# Patient Record
Sex: Male | Born: 1945 | Race: White | Hispanic: No | Smoking: Former smoker
Health system: Southern US, Community
[De-identification: ages and names within clinical notes are randomized; demographics above are authoritative.]

## PROBLEM LIST (undated history)

## (undated) DIAGNOSIS — C801 Malignant (primary) neoplasm, unspecified: Secondary | ICD-10-CM

## (undated) DIAGNOSIS — E785 Hyperlipidemia, unspecified: Secondary | ICD-10-CM

## (undated) DIAGNOSIS — R011 Cardiac murmur, unspecified: Secondary | ICD-10-CM

## (undated) DIAGNOSIS — R0989 Other specified symptoms and signs involving the circulatory and respiratory systems: Secondary | ICD-10-CM

## (undated) DIAGNOSIS — Z87891 Personal history of nicotine dependence: Secondary | ICD-10-CM

## (undated) DIAGNOSIS — T451X5A Adverse effect of antineoplastic and immunosuppressive drugs, initial encounter: Secondary | ICD-10-CM

## (undated) DIAGNOSIS — M810 Age-related osteoporosis without current pathological fracture: Secondary | ICD-10-CM

## (undated) DIAGNOSIS — R7301 Impaired fasting glucose: Secondary | ICD-10-CM

## (undated) DIAGNOSIS — R112 Nausea with vomiting, unspecified: Secondary | ICD-10-CM

## (undated) DIAGNOSIS — I370 Nonrheumatic pulmonary valve stenosis: Secondary | ICD-10-CM

## (undated) DIAGNOSIS — I10 Essential (primary) hypertension: Secondary | ICD-10-CM

## (undated) DIAGNOSIS — T7840XA Allergy, unspecified, initial encounter: Secondary | ICD-10-CM

## (undated) DIAGNOSIS — J439 Emphysema, unspecified: Secondary | ICD-10-CM

## (undated) HISTORY — DX: Malignant (primary) neoplasm, unspecified: C80.1

## (undated) HISTORY — DX: Impaired fasting glucose: R73.01

## (undated) HISTORY — DX: Age-related osteoporosis without current pathological fracture: M81.0

## (undated) HISTORY — DX: Cardiac murmur, unspecified: R01.1

## (undated) HISTORY — DX: Nonrheumatic pulmonary valve stenosis: I37.0

## (undated) HISTORY — DX: Nausea with vomiting, unspecified: T45.1X5A

## (undated) HISTORY — DX: Personal history of nicotine dependence: Z87.891

## (undated) HISTORY — DX: Allergy, unspecified, initial encounter: T78.40XA

## (undated) HISTORY — DX: Nausea with vomiting, unspecified: R11.2

## (undated) HISTORY — DX: Emphysema, unspecified: J43.9

## (undated) HISTORY — DX: Other specified symptoms and signs involving the circulatory and respiratory systems: R09.89

## (undated) HISTORY — DX: Hyperlipidemia, unspecified: E78.5

---

## 2006-07-22 ENCOUNTER — Ambulatory Visit: Payer: Self-pay | Admitting: Ophthalmology

## 2008-03-11 HISTORY — PX: EYE SURGERY: SHX253

## 2009-04-12 ENCOUNTER — Ambulatory Visit: Payer: Self-pay | Admitting: Ophthalmology

## 2010-09-03 ENCOUNTER — Ambulatory Visit: Payer: Self-pay | Admitting: Family Medicine

## 2014-01-21 LAB — PSA

## 2014-04-04 DIAGNOSIS — J0140 Acute pansinusitis, unspecified: Secondary | ICD-10-CM | POA: Diagnosis not present

## 2014-04-26 DIAGNOSIS — E785 Hyperlipidemia, unspecified: Secondary | ICD-10-CM | POA: Diagnosis not present

## 2014-04-26 DIAGNOSIS — E781 Pure hyperglyceridemia: Secondary | ICD-10-CM | POA: Diagnosis not present

## 2014-04-26 DIAGNOSIS — J329 Chronic sinusitis, unspecified: Secondary | ICD-10-CM | POA: Diagnosis not present

## 2014-07-26 ENCOUNTER — Ambulatory Visit
Admission: RE | Admit: 2014-07-26 | Discharge: 2014-07-26 | Disposition: A | Discharge status: Home / Self Care | Payer: Medicare Other | Source: Ambulatory Visit | Attending: Unknown Physician Specialty | Admitting: Unknown Physician Specialty

## 2014-07-26 ENCOUNTER — Other Ambulatory Visit: Payer: Self-pay | Admitting: Unknown Physician Specialty

## 2014-07-26 DIAGNOSIS — R2241 Localized swelling, mass and lump, right lower limb: Secondary | ICD-10-CM

## 2014-07-26 DIAGNOSIS — E785 Hyperlipidemia, unspecified: Secondary | ICD-10-CM | POA: Diagnosis not present

## 2014-07-26 DIAGNOSIS — M1711 Unilateral primary osteoarthritis, right knee: Secondary | ICD-10-CM | POA: Diagnosis not present

## 2014-07-26 DIAGNOSIS — M25861 Other specified joint disorders, right knee: Secondary | ICD-10-CM | POA: Diagnosis not present

## 2014-07-26 DIAGNOSIS — E781 Pure hyperglyceridemia: Secondary | ICD-10-CM | POA: Diagnosis not present

## 2014-07-27 ENCOUNTER — Other Ambulatory Visit: Payer: Self-pay | Admitting: Unknown Physician Specialty

## 2014-07-27 DIAGNOSIS — R2241 Localized swelling, mass and lump, right lower limb: Secondary | ICD-10-CM

## 2014-08-03 ENCOUNTER — Ambulatory Visit: Payer: Medicare Other

## 2014-08-10 ENCOUNTER — Telehealth: Payer: Self-pay | Admitting: Unknown Physician Specialty

## 2014-08-10 ENCOUNTER — Ambulatory Visit
Admission: RE | Admit: 2014-08-10 | Discharge: 2014-08-10 | Disposition: A | Discharge status: Home / Self Care | Payer: Medicare Other | Source: Ambulatory Visit | Attending: Unknown Physician Specialty | Admitting: Unknown Physician Specialty

## 2014-08-10 DIAGNOSIS — M23000 Cystic meniscus, unspecified lateral meniscus, right knee: Secondary | ICD-10-CM | POA: Diagnosis not present

## 2014-08-10 DIAGNOSIS — R2241 Localized swelling, mass and lump, right lower limb: Secondary | ICD-10-CM

## 2014-08-10 DIAGNOSIS — M1711 Unilateral primary osteoarthritis, right knee: Secondary | ICD-10-CM | POA: Diagnosis not present

## 2014-08-10 DIAGNOSIS — S83206A Unspecified tear of unspecified meniscus, current injury, right knee, initial encounter: Secondary | ICD-10-CM

## 2014-08-10 DIAGNOSIS — M25561 Pain in right knee: Secondary | ICD-10-CM | POA: Diagnosis not present

## 2014-08-10 MED ORDER — GADOBENATE DIMEGLUMINE 529 MG/ML IV SOLN
18.0000 mL | Freq: Once | INTRAVENOUS | Status: AC | PRN
  Administered 2014-08-10: 18 mL via INTRAVENOUS

## 2014-08-10 NOTE — Telephone Encounter (Signed)
Pt with meniscal tear with associated cyst accounting for palpable abnormality.  Will refer to Orthopedics.  Left message on answering machine

## 2014-09-30 DIAGNOSIS — S83261A Peripheral tear of lateral meniscus, current injury, right knee, initial encounter: Secondary | ICD-10-CM | POA: Diagnosis not present

## 2014-09-30 DIAGNOSIS — M23000 Cystic meniscus, unspecified lateral meniscus, right knee: Secondary | ICD-10-CM | POA: Diagnosis not present

## 2014-09-30 DIAGNOSIS — M1711 Unilateral primary osteoarthritis, right knee: Secondary | ICD-10-CM | POA: Diagnosis not present

## 2015-01-12 ENCOUNTER — Encounter: Payer: Self-pay | Admitting: Unknown Physician Specialty

## 2015-01-20 ENCOUNTER — Encounter: Payer: Self-pay | Admitting: Unknown Physician Specialty

## 2015-01-20 ENCOUNTER — Ambulatory Visit (INDEPENDENT_AMBULATORY_CARE_PROVIDER_SITE_OTHER): Payer: Medicare Other | Admitting: Unknown Physician Specialty

## 2015-01-20 VITALS — BP 125/74 | HR 66 | Temp 98.5°F | Ht 73.3 in | Wt 200.6 lb

## 2015-01-20 DIAGNOSIS — Z23 Encounter for immunization: Secondary | ICD-10-CM

## 2015-01-20 DIAGNOSIS — E785 Hyperlipidemia, unspecified: Secondary | ICD-10-CM | POA: Insufficient documentation

## 2015-01-20 DIAGNOSIS — J0101 Acute recurrent maxillary sinusitis: Secondary | ICD-10-CM

## 2015-01-20 DIAGNOSIS — E781 Pure hyperglyceridemia: Secondary | ICD-10-CM | POA: Insufficient documentation

## 2015-01-20 DIAGNOSIS — R7301 Impaired fasting glucose: Secondary | ICD-10-CM

## 2015-01-20 DIAGNOSIS — J309 Allergic rhinitis, unspecified: Secondary | ICD-10-CM | POA: Insufficient documentation

## 2015-01-20 DIAGNOSIS — M1990 Unspecified osteoarthritis, unspecified site: Secondary | ICD-10-CM

## 2015-01-20 MED ORDER — AMOXICILLIN-POT CLAVULANATE 875-125 MG PO TABS: 1.0000 | ORAL_TABLET | Freq: Two times a day (BID) | ORAL | Status: DC

## 2015-01-20 NOTE — Addendum Note (Signed)
Addended by: Moss Mc on: 01/20/2015 04:25 PM   Modules accepted: Orders, SmartSet

## 2015-01-20 NOTE — Progress Notes (Signed)
   BP 125/74 mmHg  Pulse 66  Temp(Src) 98.5 F (36.9 C)  Ht 6' 1.3" (1.862 m)  Wt 200 lb 9.6 oz (90.992 kg)  BMI 26.24 kg/m2  SpO2 97%   Subjective:    Patient ID: Marcus Cortez, male    DOB: 02-12-1946, 69 y.o.   MRN: [id number]  HPI: Marcus Cortez is a 69 y.o. male  Chief Complaint  Patient presents with  . URI    pt states he has had some nasal congestion, drainage, cough, and feels like ears have water in them. Wants to know if it is okay to get a flu shot.   Sinusitis This is a recurrent problem. The current episode started 1 to 4 weeks ago. The problem has been gradually worsening since onset. There has been no fever. Associated symptoms include congestion, headaches, a hoarse voice, sinus pressure and a sore throat. Pertinent negatives include no chills, coughing, diaphoresis or ear pain.    Relevant past medical, surgical, family and social history reviewed and updated as indicated. Interim medical history since our last visit reviewed. Allergies and medications reviewed and updated.  Review of Systems  Constitutional: Negative for chills and diaphoresis.  HENT: Positive for congestion, hoarse voice, sinus pressure and sore throat. Negative for ear pain.   Respiratory: Negative for cough.   Neurological: Positive for headaches.    Per HPI unless specifically indicated above     Objective:    BP 125/74 mmHg  Pulse 66  Temp(Src) 98.5 F (36.9 C)  Ht 6' 1.3" (1.862 m)  Wt 200 lb 9.6 oz (90.992 kg)  BMI 26.24 kg/m2  SpO2 97%  Wt Readings from Last 3 Encounters:  01/20/15 200 lb 9.6 oz (90.992 kg)  07/26/14 199 lb (90.266 kg)    Physical Exam  Constitutional: He is oriented to person, place, and time. He appears well-developed and well-nourished. No distress.  HENT:  Head: Normocephalic and atraumatic.  Right Ear: Tympanic membrane and ear canal normal.  Left Ear: Tympanic membrane and ear canal normal.  Nose: Mucosal edema and sinus tenderness present.  Right sinus exhibits maxillary sinus tenderness. Left sinus exhibits maxillary sinus tenderness.  Mouth/Throat: Mucous membranes are normal. Oropharyngeal exudate and posterior oropharyngeal edema present.  Eyes: Conjunctivae and lids are normal. Right eye exhibits no discharge. Left eye exhibits no discharge. No scleral icterus.  Cardiovascular: Normal rate and regular rhythm.   Pulmonary/Chest: Effort normal. No respiratory distress.  Abdominal: Normal appearance and bowel sounds are normal. He exhibits no distension. There is no splenomegaly or hepatomegaly. There is no tenderness.  Musculoskeletal: Normal range of motion.  Neurological: He is alert and oriented to person, place, and time.  Skin: Skin is intact. No rash noted. No pallor.  Psychiatric: He has a normal mood and affect. His behavior is normal. Judgment and thought content normal.    Results for orders placed or performed in visit on 01/20/15  PSA  Result Value Ref Range   PSA from PP       Assessment & Plan:   Problem List Items Addressed This Visit    None    Visit Diagnoses    Acute recurrent maxillary sinusitis    -  Primary    Relevant Medications    amoxicillin-clavulanate (AUGMENTIN) 875-125 MG tablet        Follow up plan: Return if symptoms worsen or fail to improve.

## 2015-01-26 DIAGNOSIS — Z961 Presence of intraocular lens: Secondary | ICD-10-CM | POA: Diagnosis not present

## 2015-02-07 ENCOUNTER — Encounter: Payer: Self-pay | Admitting: Unknown Physician Specialty

## 2015-04-25 ENCOUNTER — Ambulatory Visit (INDEPENDENT_AMBULATORY_CARE_PROVIDER_SITE_OTHER): Payer: Medicare Other | Admitting: Family Medicine

## 2015-04-25 ENCOUNTER — Encounter: Payer: Self-pay | Admitting: Family Medicine

## 2015-04-25 VITALS — BP 122/72 | HR 58 | Temp 97.7°F | Ht 72.0 in | Wt 201.0 lb

## 2015-04-25 DIAGNOSIS — R7301 Impaired fasting glucose: Secondary | ICD-10-CM | POA: Diagnosis not present

## 2015-04-25 DIAGNOSIS — J329 Chronic sinusitis, unspecified: Secondary | ICD-10-CM | POA: Diagnosis not present

## 2015-04-25 DIAGNOSIS — H6091 Unspecified otitis externa, right ear: Secondary | ICD-10-CM

## 2015-04-25 DIAGNOSIS — E785 Hyperlipidemia, unspecified: Secondary | ICD-10-CM

## 2015-04-25 DIAGNOSIS — R351 Nocturia: Secondary | ICD-10-CM | POA: Diagnosis not present

## 2015-04-25 DIAGNOSIS — Z0000 Encounter for general adult medical examination without abnormal findings: Secondary | ICD-10-CM

## 2015-04-25 LAB — BAYER DCA HB A1C WAIVED: HB A1C (BAYER DCA - WAIVED): 5.9 % (ref <7.0)

## 2015-04-25 MED ORDER — DOXYCYCLINE HYCLATE 100 MG PO TABS: 100.0000 mg | ORAL_TABLET | Freq: Two times a day (BID) | ORAL | Status: DC

## 2015-04-25 MED ORDER — NEOMYCIN-POLYMYXIN-HC 3.5-10000-1 OT SOLN: 4.0000 [drp] | Freq: Four times a day (QID) | OTIC | Status: DC

## 2015-04-25 MED ORDER — FLUTICASONE PROPIONATE 50 MCG/ACT NA SUSP: 2.0000 | Freq: Every day | NASAL | Status: DC

## 2015-04-25 NOTE — Progress Notes (Signed)
BP 122/72 mmHg  Pulse 58  Temp(Src) 97.7 F (36.5 C)  Ht 6' (1.829 m)  Wt 201 lb (91.173 kg)  BMI 27.25 kg/m2  SpO2 96%   Subjective:    Patient ID: Marcus Cortez, male    DOB: December 06, 1945, 70 y.o.   MRN: [id number]  HPI: Marcus Cortez is a 70 y.o. male  Chief Complaint  Patient presents with  . Annual Exam   Sinusitis This is a chronic (was given Augmenting with partial relief) problem. The problem is unchanged. There has been no fever. The fever has been present for less than 1 day. Associated symptoms include congestion, ear pain, sinus pressure and a sore throat. Pertinent negatives include no shortness of breath. Past treatments include nothing. The treatment provided no relief.   Hyperlipidemia Stopped Gemfibrizole No Muscle aches  Diet compliance Exercise      Relevant past medical, surgical, family and social history reviewed and updated as indicated. Interim medical history since our last visit reviewed. Allergies and medications reviewed and updated.  Review of Systems  Constitutional: Negative.   HENT: Positive for congestion, ear pain, sinus pressure and sore throat.   Eyes: Negative.   Respiratory: Negative.  Negative for shortness of breath.   Cardiovascular: Negative.   Gastrointestinal: Negative.   Endocrine: Negative.   Genitourinary: Negative.        Has to use the bathroom at least once at night  Skin: Negative.   Allergic/Immunologic: Negative.   Neurological: Negative.   Hematological: Negative.   Psychiatric/Behavioral: Negative.     Per HPI unless specifically indicated above     Objective:    BP 122/72 mmHg  Pulse 58  Temp(Src) 97.7 F (36.5 C)  Ht 6' (1.829 m)  Wt 201 lb (91.173 kg)  BMI 27.25 kg/m2  SpO2 96%  Wt Readings from Last 3 Encounters:  04/25/15 201 lb (91.173 kg)  01/20/15 200 lb 9.6 oz (90.992 kg)  07/26/14 199 lb (90.266 kg)    Physical Exam  Constitutional: He is oriented to person, place, and time. He  appears well-developed and well-nourished.  HENT:  Head: Normocephalic.  Right Ear: Tympanic membrane normal. There is drainage and swelling.  Left Ear: Tympanic membrane, external ear and ear canal normal.  Mouth/Throat: Uvula is midline, oropharynx is clear and moist and mucous membranes are normal.  Eyes: Pupils are equal, round, and reactive to light.  Cardiovascular: Normal rate, regular rhythm and normal heart sounds.  Exam reveals no gallop and no friction rub.   No murmur heard. Pulmonary/Chest: Effort normal and breath sounds normal. No respiratory distress.  Abdominal: Soft. Bowel sounds are normal. He exhibits no distension. There is no tenderness.  Musculoskeletal: Normal range of motion.  Neurological: He is alert and oriented to person, place, and time. He has normal reflexes.  Skin: Skin is warm and dry.  Psychiatric: He has a normal mood and affect. His behavior is normal. Judgment and thought content normal.    Results for orders placed or performed in visit on 01/20/15  PSA  Result Value Ref Range   PSA from PP       Assessment & Plan:   Problem List Items Addressed This Visit      Unprioritized   IFG (impaired fasting glucose)   Relevant Orders   Comprehensive metabolic panel   Bayer DCA Hb A1c Waived   Hyperlipidemia - Primary   Relevant Orders   Lipid Panel w/o Chol/HDL Ratio    Other  Visit Diagnoses    Annual physical exam        Recurrent sinusitis        Rx for Flonase.  Rx for Doxycycline    Relevant Medications    doxycycline (VIBRA-TABS) 100 MG tablet    fluticasone (FLONASE) 50 MCG/ACT nasal spray    Otitis externa, right        Nocturia        Relevant Orders    PSA        Follow up plan: Return in about 6 months (around 10/23/2015).

## 2015-04-26 ENCOUNTER — Telehealth: Payer: Self-pay | Admitting: Unknown Physician Specialty

## 2015-04-26 LAB — COMPREHENSIVE METABOLIC PANEL
A/G RATIO: 1.3 (ref 1.1–2.5)
ALT: 15 IU/L (ref 0–44)
AST: 23 IU/L (ref 0–40)
Albumin: 4.3 g/dL (ref 3.5–4.8)
Alkaline Phosphatase: 76 IU/L (ref 39–117)
BUN/Creatinine Ratio: 17 (ref 10–22)
BUN: 16 mg/dL (ref 8–27)
Bilirubin Total: 0.6 mg/dL (ref 0.0–1.2)
CALCIUM: 8.8 mg/dL (ref 8.6–10.2)
CO2: 22 mmol/L (ref 18–29)
Chloride: 99 mmol/L (ref 96–106)
Creatinine, Ser: 0.96 mg/dL (ref 0.76–1.27)
GFR, EST AFRICAN AMERICAN: 92 mL/min/{1.73_m2} (ref >59)
GFR, EST NON AFRICAN AMERICAN: 79 mL/min/{1.73_m2} (ref >59)
GLOBULIN, TOTAL: 3.2 g/dL (ref 1.5–4.5)
Glucose: 85 mg/dL (ref 65–99)
POTASSIUM: 4 mmol/L (ref 3.5–5.2)
SODIUM: 138 mmol/L (ref 134–144)
TOTAL PROTEIN: 7.5 g/dL (ref 6.0–8.5)

## 2015-04-26 LAB — LIPID PANEL W/O CHOL/HDL RATIO
CHOLESTEROL TOTAL: 227 mg/dL — AB (ref 100–199)
HDL: 25 mg/dL — ABNORMAL LOW (ref >39)
LDL Calculated: 122 mg/dL — ABNORMAL HIGH (ref 0–99)
Triglycerides: 400 mg/dL — ABNORMAL HIGH (ref 0–149)
VLDL CHOLESTEROL CAL: 80 mg/dL — AB (ref 5–40)

## 2015-04-26 LAB — PSA: PROSTATE SPECIFIC AG, SERUM: 1.2 ng/mL (ref 0.0–4.0)

## 2015-04-26 NOTE — Telephone Encounter (Signed)
Discussed with pt high Triglycerides that are 400.  Restart Gemfibrizole.  Work on [employer]

## 2015-06-13 NOTE — Telephone Encounter (Signed)
Pt scheduled for 07/14/15 @ 8:45am. Thanks.

## 2015-07-14 ENCOUNTER — Ambulatory Visit (INDEPENDENT_AMBULATORY_CARE_PROVIDER_SITE_OTHER): Payer: Medicare Other | Admitting: Unknown Physician Specialty

## 2015-07-14 ENCOUNTER — Encounter: Payer: Self-pay | Admitting: Unknown Physician Specialty

## 2015-07-14 VITALS — BP 134/73 | HR 55 | Temp 98.5°F | Ht 72.3 in | Wt 200.6 lb

## 2015-07-14 DIAGNOSIS — Z0000 Encounter for general adult medical examination without abnormal findings: Secondary | ICD-10-CM

## 2015-07-14 DIAGNOSIS — E781 Pure hyperglyceridemia: Secondary | ICD-10-CM

## 2015-07-14 DIAGNOSIS — E785 Hyperlipidemia, unspecified: Secondary | ICD-10-CM

## 2015-07-14 LAB — LIPID PANEL PICCOLO, WAIVED
CHOL/HDL RATIO PICCOLO,WAIVE: 7 mg/dL — AB
Cholesterol Piccolo, Waived: 223 mg/dL — ABNORMAL HIGH (ref <200)
HDL CHOL PICCOLO, WAIVED: 32 mg/dL — AB (ref >59)
LDL Chol Calc Piccolo Waived: 153 mg/dL — ABNORMAL HIGH (ref <100)
TRIGLYCERIDES PICCOLO,WAIVED: 192 mg/dL — AB (ref <150)
VLDL CHOL CALC PICCOLO,WAIVE: 38 mg/dL — AB (ref <30)

## 2015-07-14 MED ORDER — GEMFIBROZIL 600 MG PO TABS: 600.0000 mg | ORAL_TABLET | Freq: Two times a day (BID) | ORAL | Status: DC

## 2015-07-14 NOTE — Patient Instructions (Signed)
Add Metamucil to your diet

## 2015-07-14 NOTE — Assessment & Plan Note (Addendum)
LDL 154.  Unable to tolerate Statins.  Encouraged to try Metamucil supplements

## 2015-07-14 NOTE — Progress Notes (Signed)
BP 134/73 mmHg  Pulse 55  Temp(Src) 98.5 F (36.9 C)  Ht 6' 0.3" (1.836 m)  Wt 200 lb 9.6 oz (90.992 kg)  BMI 26.99 kg/m2  SpO2 92%   Subjective:    Patient ID: Marcus Cortez, male    DOB: 1945-06-07, 70 y.o.   MRN: [id number]  HPI: [name] is a 70 y.o. male  Chief Complaint  Patient presents with  . Hyperlipidemia  . Labs Only    Hep C order entered   Hypertriglyceridemia Pt is taking Gemfibrizole daily rather than twice a day and ran out last week.  He doesn't drink.  He is cutting back on red meat and started eating more vegetable.  Doesn't eat many simple carbohydrates other than potatoes.  Unable to tolerate statins.    Relevant past medical, surgical, family and social history reviewed and updated as indicated. Interim medical history since our last visit reviewed. Allergies and medications reviewed and updated.  Review of Systems  Per HPI unless specifically indicated above     Objective:    BP 134/73 mmHg  Pulse 55  Temp(Src) 98.5 F (36.9 C)  Ht 6' 0.3" (1.836 m)  Wt 200 lb 9.6 oz (90.992 kg)  BMI 26.99 kg/m2  SpO2 92%  Wt Readings from Last 3 Encounters:  07/14/15 200 lb 9.6 oz (90.992 kg)  04/25/15 201 lb (91.173 kg)  01/20/15 200 lb 9.6 oz (90.992 kg)    Physical Exam  Constitutional: He is oriented to person, place, and time. He appears well-developed and well-nourished. No distress.  HENT:  Head: Normocephalic and atraumatic.  Eyes: Conjunctivae and lids are normal. Right eye exhibits no discharge. Left eye exhibits no discharge. No scleral icterus.  Neck: Normal range of motion. Neck supple. No JVD present. Carotid bruit is not present.  Cardiovascular: Normal rate, regular rhythm and normal heart sounds.   Pulmonary/Chest: Effort normal and breath sounds normal. No respiratory distress.  Abdominal: Normal appearance. There is no splenomegaly or hepatomegaly.  Musculoskeletal: Normal range of motion.  Neurological: He is alert and  oriented to person, place, and time.  Skin: Skin is warm, dry and intact. No rash noted. No pallor.  Psychiatric: He has a normal mood and affect. His behavior is normal. Judgment and thought content normal.    Results for orders placed or performed in visit on 04/25/15  PSA  Result Value Ref Range   Prostate Specific Ag, Serum 1.2 0.0 - 4.0 ng/mL  Lipid Panel w/o Chol/HDL Ratio  Result Value Ref Range   Cholesterol, Total 227 (H) 100 - 199 mg/dL   Triglycerides 400 (H) 0 - 149 mg/dL   HDL 25 (L) >39 mg/dL   VLDL Cholesterol Cal 80 (H) 5 - 40 mg/dL   LDL Calculated 122 (H) 0 - 99 mg/dL  Comprehensive metabolic panel  Result Value Ref Range   Glucose 85 65 - 99 mg/dL   BUN 16 8 - 27 mg/dL   Creatinine, Ser 0.96 0.76 - 1.27 mg/dL   GFR calc non Af Amer 79 >59 mL/min/1.73   GFR calc Af Amer 92 >59 mL/min/1.73   BUN/Creatinine Ratio 17 10 - 22   Sodium 138 134 - 144 mmol/L   Potassium 4.0 3.5 - 5.2 mmol/L   Chloride 99 96 - 106 mmol/L   CO2 22 18 - 29 mmol/L   Calcium 8.8 8.6 - 10.2 mg/dL   Total Protein 7.5 6.0 - 8.5 g/dL   Albumin 4.3 3.5 -  4.8 g/dL   Globulin, Total 3.2 1.5 - 4.5 g/dL   Albumin/Globulin Ratio 1.3 1.1 - 2.5   Bilirubin Total 0.6 0.0 - 1.2 mg/dL   Alkaline Phosphatase 76 39 - 117 IU/L   AST 23 0 - 40 IU/L   ALT 15 0 - 44 IU/L  Bayer DCA Hb A1c Waived  Result Value Ref Range   Bayer DCA Hb A1c Waived 5.9 <7.0 %      Assessment & Plan:   Problem List Items Addressed This Visit      Unprioritized   Hyperlipidemia    LDL 154.  Unable to tolerate Statins.  Encouraged to try Metamucil supplements      Relevant Medications   gemfibrozil (LOPID) 600 MG tablet   Hypertriglyceridemia - Primary    193 which is down from 400.        Relevant Medications   gemfibrozil (LOPID) 600 MG tablet   Other Relevant Orders   Comprehensive metabolic panel   Lipid Panel Piccolo, Waived    Other Visit Diagnoses    Health care maintenance        Relevant Orders     Hepatitis C antibody        Follow up plan: Return in about 6 months (around 01/14/2016) for for physical.

## 2015-07-14 NOTE — Assessment & Plan Note (Signed)
193 which is down from 400.

## 2015-07-15 ENCOUNTER — Encounter: Payer: Self-pay | Admitting: Unknown Physician Specialty

## 2015-07-15 LAB — COMPREHENSIVE METABOLIC PANEL
ALK PHOS: 60 IU/L (ref 39–117)
ALT: 14 IU/L (ref 0–44)
AST: 20 IU/L (ref 0–40)
Albumin/Globulin Ratio: 1.3 (ref 1.2–2.2)
Albumin: 4.2 g/dL (ref 3.5–4.8)
BILIRUBIN TOTAL: 0.8 mg/dL (ref 0.0–1.2)
BUN/Creatinine Ratio: 10 (ref 10–24)
BUN: 10 mg/dL (ref 8–27)
CHLORIDE: 101 mmol/L (ref 96–106)
CO2: 22 mmol/L (ref 18–29)
CREATININE: 0.98 mg/dL (ref 0.76–1.27)
Calcium: 9.1 mg/dL (ref 8.6–10.2)
GFR calc Af Amer: 89 mL/min/{1.73_m2} (ref >59)
GFR calc non Af Amer: 77 mL/min/{1.73_m2} (ref >59)
GLUCOSE: 100 mg/dL — AB (ref 65–99)
Globulin, Total: 3.3 g/dL (ref 1.5–4.5)
Potassium: 4.2 mmol/L (ref 3.5–5.2)
Sodium: 139 mmol/L (ref 134–144)
Total Protein: 7.5 g/dL (ref 6.0–8.5)

## 2015-07-15 LAB — HEPATITIS C ANTIBODY

## 2015-07-15 NOTE — Progress Notes (Signed)
Quick Note:  Normal labs. Patient notified by letter. ______

## 2015-09-15 ENCOUNTER — Encounter: Payer: Self-pay | Admitting: Unknown Physician Specialty

## 2015-09-15 ENCOUNTER — Ambulatory Visit (INDEPENDENT_AMBULATORY_CARE_PROVIDER_SITE_OTHER): Payer: Medicare Other | Admitting: Unknown Physician Specialty

## 2015-09-15 VITALS — BP 126/76 | HR 68 | Temp 97.9°F | Ht 73.0 in | Wt 208.2 lb

## 2015-09-15 DIAGNOSIS — H6092 Unspecified otitis externa, left ear: Secondary | ICD-10-CM | POA: Diagnosis not present

## 2015-09-15 MED ORDER — AMOXICILLIN-POT CLAVULANATE 875-125 MG PO TABS: 1.0000 | ORAL_TABLET | Freq: Two times a day (BID) | ORAL | Status: DC

## 2015-09-15 MED ORDER — NEOMYCIN-POLYMYXIN-HC 3.5-10000-1 OT SOLN: 4.0000 [drp] | Freq: Four times a day (QID) | OTIC | Status: DC

## 2015-09-15 MED ORDER — PREDNISONE 20 MG PO TABS: 20.0000 mg | ORAL_TABLET | Freq: Every day | ORAL | Status: DC

## 2015-09-15 NOTE — Progress Notes (Signed)
BP 126/76 mmHg  Pulse 68  Temp(Src) 97.9 F (36.6 C)  Ht '6\' 1"'$  (1.854 m)  Wt 208 lb 3.2 oz (94.439 kg)  BMI 27.47 kg/m2  SpO2 96%   Subjective:    Patient ID: Marcus Cortez, male    DOB: February 27, 1946, 70 y.o.   MRN: [id number]  HPI: LIBRADO GUANDIQUE is a 69 y.o. male  Chief Complaint  Patient presents with  . Ear Pain    pt states his left ear has been bothering him for about a week. States when he wakes up, he also has some sinus pressure.    Otalgia  There is pain in the left ear. This is a new problem. The current episode started in the past 7 days. The problem occurs constantly. The problem has been gradually worsening. There has been no fever. The pain is severe. Associated symptoms include ear discharge, hearing loss and rhinorrhea. Treatments tried: ear drops I gave him last time. The treatment provided no relief.    Relevant past medical, surgical, family and social history reviewed and updated as indicated. Interim medical history since our last visit reviewed. Allergies and medications reviewed and updated.  Review of Systems  HENT: Positive for ear discharge, ear pain, hearing loss and rhinorrhea.     Per HPI unless specifically indicated above     Objective:    BP 126/76 mmHg  Pulse 68  Temp(Src) 97.9 F (36.6 C)  Ht '6\' 1"'$  (1.854 m)  Wt 208 lb 3.2 oz (94.439 kg)  BMI 27.47 kg/m2  SpO2 96%  Wt Readings from Last 3 Encounters:  09/15/15 208 lb 3.2 oz (94.439 kg)  07/14/15 200 lb 9.6 oz (90.992 kg)  04/25/15 201 lb (91.173 kg)    Physical Exam  Constitutional: He is oriented to person, place, and time. He appears well-developed and well-nourished. No distress.  HENT:  Head: Normocephalic and atraumatic.  Right Ear: Tympanic membrane, external ear and ear canal normal.  Left Ear: There is drainage, swelling and tenderness. There is mastoid tenderness.  Nose: Nose normal.  Mouth/Throat: Uvula is midline.  Eyes: Conjunctivae and lids are normal. Right  eye exhibits no discharge. Left eye exhibits no discharge. No scleral icterus.  Neck: Normal range of motion. Neck supple. No JVD present. Carotid bruit is not present.  Cardiovascular: Normal rate, regular rhythm and normal heart sounds.   Pulmonary/Chest: Effort normal and breath sounds normal. No respiratory distress.  Abdominal: Normal appearance. There is no splenomegaly or hepatomegaly.  Musculoskeletal: Normal range of motion.  Neurological: He is alert and oriented to person, place, and time.  Skin: Skin is warm, dry and intact. No rash noted. No pallor.  Psychiatric: He has a normal mood and affect. His behavior is normal. Judgment and thought content normal.    Results for orders placed or performed in visit on 07/14/15  Hepatitis C antibody  Result Value Ref Range   Hep C Virus Ab <0.1 0.0 - 0.9 s/co ratio  Comprehensive metabolic panel  Result Value Ref Range   Glucose 100 (H) 65 - 99 mg/dL   BUN 10 8 - 27 mg/dL   Creatinine, Ser 0.98 0.76 - 1.27 mg/dL   GFR calc non Af Amer 77 >59 mL/min/1.73   GFR calc Af Amer 89 >59 mL/min/1.73   BUN/Creatinine Ratio 10 10 - 24   Sodium 139 134 - 144 mmol/L   Potassium 4.2 3.5 - 5.2 mmol/L   Chloride 101 96 - 106 mmol/L  CO2 22 18 - 29 mmol/L   Calcium 9.1 8.6 - 10.2 mg/dL   Total Protein 7.5 6.0 - 8.5 g/dL   Albumin 4.2 3.5 - 4.8 g/dL   Globulin, Total 3.3 1.5 - 4.5 g/dL   Albumin/Globulin Ratio 1.3 1.2 - 2.2   Bilirubin Total 0.8 0.0 - 1.2 mg/dL   Alkaline Phosphatase 60 39 - 117 IU/L   AST 20 0 - 40 IU/L   ALT 14 0 - 44 IU/L  Lipid Panel Piccolo, Waived  Result Value Ref Range   Cholesterol Piccolo, Waived 223 (H) <200 mg/dL   HDL Chol Piccolo, Waived 32 (L) >59 mg/dL   Triglycerides Piccolo,Waived 192 (H) <150 mg/dL   Chol/HDL Ratio Piccolo,Waive 7.0 (H) mg/dL   LDL Chol Calc Piccolo Waived 153 (H) <100 mg/dL   VLDL Chol Calc Piccolo,Waive 38 (H) <30 mg/dL      Assessment & Plan:   Problem List Items Addressed This  Visit    None    Visit Diagnoses    Otitis externa, left    -  Primary    We have no wicks available.  Will rx with oral prednsone and antibiotics.  Rx for corticosporin.          Follow up plan: Return if symptoms worsen or fail to improve.

## 2015-10-25 ENCOUNTER — Ambulatory Visit (INDEPENDENT_AMBULATORY_CARE_PROVIDER_SITE_OTHER): Payer: Medicare Other | Admitting: Unknown Physician Specialty

## 2015-10-25 ENCOUNTER — Encounter: Payer: Self-pay | Admitting: Unknown Physician Specialty

## 2015-10-25 VITALS — BP 126/76 | HR 59 | Temp 97.9°F | Ht 73.1 in | Wt 206.2 lb

## 2015-10-25 DIAGNOSIS — E785 Hyperlipidemia, unspecified: Secondary | ICD-10-CM

## 2015-10-25 DIAGNOSIS — E781 Pure hyperglyceridemia: Secondary | ICD-10-CM | POA: Diagnosis not present

## 2015-10-25 DIAGNOSIS — R7301 Impaired fasting glucose: Secondary | ICD-10-CM

## 2015-10-25 LAB — LIPID PANEL PICCOLO, WAIVED
CHOL/HDL RATIO PICCOLO,WAIVE: 5.7 mg/dL — AB
Cholesterol Piccolo, Waived: 220 mg/dL — ABNORMAL HIGH (ref <200)
HDL CHOL PICCOLO, WAIVED: 38 mg/dL — AB (ref >59)
LDL CHOL CALC PICCOLO WAIVED: 139 mg/dL — AB (ref <100)
TRIGLYCERIDES PICCOLO,WAIVED: 210 mg/dL — AB (ref <150)
VLDL CHOL CALC PICCOLO,WAIVE: 42 mg/dL — AB (ref <30)

## 2015-10-25 LAB — BAYER DCA HB A1C WAIVED: HB A1C (BAYER DCA - WAIVED): 6 % (ref <7.0)

## 2015-10-25 MED ORDER — GEMFIBROZIL 600 MG PO TABS
600.0000 mg | ORAL_TABLET | Freq: Two times a day (BID) | ORAL | 3 refills | Status: DC
Start: 2015-10-25 — End: 2016-01-31

## 2015-10-25 NOTE — Assessment & Plan Note (Signed)
Continue present meds.  Reviewed ASCVD calculator.  Refusing statins

## 2015-10-25 NOTE — Progress Notes (Signed)
BP 126/76 (BP Location: Left Arm, Patient Position: Sitting, Cuff Size: Large)   Pulse (!) 59   Temp 97.9 F (36.6 C)   Ht 6' 1.1" (1.857 m)   Wt 206 lb 3.2 oz (93.5 kg)   SpO2 93%   BMI 27.13 kg/m    Subjective:    Patient ID: Marcus Cortez, male    DOB: 11-01-1945, 70 y.o.   MRN: [id number]  HPI: Marcus Cortez is a 70 y.o. male  Chief Complaint  Patient presents with  . Hyperlipidemia   Hyperlipidemia Using medications without problems: No Muscle aches  Diet compliance:regular Exercise:regular  Relevant past medical, surgical, family and social history reviewed and updated as indicated. Interim medical history since our last visit reviewed. Allergies and medications reviewed and updated.  Review of Systems  Per HPI unless specifically indicated above     Objective:    BP 126/76 (BP Location: Left Arm, Patient Position: Sitting, Cuff Size: Large)   Pulse (!) 59   Temp 97.9 F (36.6 C)   Ht 6' 1.1" (1.857 m)   Wt 206 lb 3.2 oz (93.5 kg)   SpO2 93%   BMI 27.13 kg/m   Wt Readings from Last 3 Encounters:  10/25/15 206 lb 3.2 oz (93.5 kg)  09/15/15 208 lb 3.2 oz (94.4 kg)  07/14/15 200 lb 9.6 oz (91 kg)    Physical Exam  Constitutional: He is oriented to person, place, and time. He appears well-developed and well-nourished. No distress.  HENT:  Head: Normocephalic and atraumatic.  Eyes: Conjunctivae and lids are normal. Right eye exhibits no discharge. Left eye exhibits no discharge. No scleral icterus.  Neck: Normal range of motion. Neck supple. No JVD present. Carotid bruit is not present.  Cardiovascular: Normal rate, regular rhythm and normal heart sounds.   Pulmonary/Chest: Effort normal and breath sounds normal. No respiratory distress.  Abdominal: Normal appearance. There is no splenomegaly or hepatomegaly.  Musculoskeletal: Normal range of motion.  Neurological: He is alert and oriented to person, place, and time.  Skin: Skin is warm, dry and  intact. No rash noted. No pallor.  Psychiatric: He has a normal mood and affect. His behavior is normal. Judgment and thought content normal.    Results for orders placed or performed in visit on 07/14/15  Hepatitis C antibody  Result Value Ref Range   Hep C Virus Ab <0.1 0.0 - 0.9 s/co ratio  Comprehensive metabolic panel  Result Value Ref Range   Glucose 100 (H) 65 - 99 mg/dL   BUN 10 8 - 27 mg/dL   Creatinine, Ser 0.98 0.76 - 1.27 mg/dL   GFR calc non Af Amer 77 >59 mL/min/1.73   GFR calc Af Amer 89 >59 mL/min/1.73   BUN/Creatinine Ratio 10 10 - 24   Sodium 139 134 - 144 mmol/L   Potassium 4.2 3.5 - 5.2 mmol/L   Chloride 101 96 - 106 mmol/L   CO2 22 18 - 29 mmol/L   Calcium 9.1 8.6 - 10.2 mg/dL   Total Protein 7.5 6.0 - 8.5 g/dL   Albumin 4.2 3.5 - 4.8 g/dL   Globulin, Total 3.3 1.5 - 4.5 g/dL   Albumin/Globulin Ratio 1.3 1.2 - 2.2   Bilirubin Total 0.8 0.0 - 1.2 mg/dL   Alkaline Phosphatase 60 39 - 117 IU/L   AST 20 0 - 40 IU/L   ALT 14 0 - 44 IU/L  Lipid Panel Piccolo, Waived  Result Value Ref Range   Cholesterol  Piccolo, Waived 223 (H) <200 mg/dL   HDL Chol Piccolo, Waived 32 (L) >59 mg/dL   Triglycerides Piccolo,Waived 192 (H) <150 mg/dL   Chol/HDL Ratio Piccolo,Waive 7.0 (H) mg/dL   LDL Chol Calc Piccolo Waived 153 (H) <100 mg/dL   VLDL Chol Calc Piccolo,Waive 38 (H) <30 mg/dL  +---------------------------------------------------------------------------------------------------------+-    Assessment & Plan:   Problem List Items Addressed This Visit      Unprioritized   Hyperlipidemia - Primary   Relevant Medications   gemfibrozil (LOPID) 600 MG tablet   Other Relevant Orders   Comprehensive metabolic panel   Lipid Panel Piccolo, Waived   Hypertriglyceridemia    Continue present meds.  Reviewed ASCVD calculator.  Refusing statins      Relevant Medications   gemfibrozil (LOPID) 600 MG tablet   IFG (impaired fasting glucose)    Hgb A1C is 6.  Diet changes  discussed.        Relevant Orders   Bayer DCA Hb A1c Waived    Other Visit Diagnoses   None.      Follow up plan: Return for November for physical.

## 2015-10-25 NOTE — Assessment & Plan Note (Addendum)
Hgb A1C is 6.  Diet changes discussed.

## 2015-10-26 ENCOUNTER — Encounter: Payer: Self-pay | Admitting: Family Medicine

## 2015-10-26 LAB — COMPREHENSIVE METABOLIC PANEL
A/G RATIO: 1.5 (ref 1.2–2.2)
ALBUMIN: 4.5 g/dL (ref 3.5–4.8)
ALT: 18 IU/L (ref 0–44)
AST: 21 IU/L (ref 0–40)
Alkaline Phosphatase: 62 IU/L (ref 39–117)
BILIRUBIN TOTAL: 0.9 mg/dL (ref 0.0–1.2)
BUN / CREAT RATIO: 12 (ref 10–24)
BUN: 13 mg/dL (ref 8–27)
CHLORIDE: 99 mmol/L (ref 96–106)
CO2: 21 mmol/L (ref 18–29)
Calcium: 9.5 mg/dL (ref 8.6–10.2)
Creatinine, Ser: 1.11 mg/dL (ref 0.76–1.27)
GFR calc non Af Amer: 66 mL/min/{1.73_m2} (ref >59)
GFR, EST AFRICAN AMERICAN: 77 mL/min/{1.73_m2} (ref >59)
GLOBULIN, TOTAL: 3.1 g/dL (ref 1.5–4.5)
Glucose: 92 mg/dL (ref 65–99)
POTASSIUM: 4.3 mmol/L (ref 3.5–5.2)
Sodium: 140 mmol/L (ref 134–144)
TOTAL PROTEIN: 7.6 g/dL (ref 6.0–8.5)

## 2015-11-09 ENCOUNTER — Ambulatory Visit (INDEPENDENT_AMBULATORY_CARE_PROVIDER_SITE_OTHER): Payer: Medicare Other | Admitting: Family Medicine

## 2015-11-09 ENCOUNTER — Encounter: Payer: Self-pay | Admitting: Family Medicine

## 2015-11-09 VITALS — BP 123/75 | HR 70 | Temp 98.7°F | Ht 73.0 in | Wt 208.8 lb

## 2015-11-09 DIAGNOSIS — T148 Other injury of unspecified body region: Secondary | ICD-10-CM

## 2015-11-09 DIAGNOSIS — W57XXXA Bitten or stung by nonvenomous insect and other nonvenomous arthropods, initial encounter: Secondary | ICD-10-CM

## 2015-11-09 DIAGNOSIS — L0291 Cutaneous abscess, unspecified: Secondary | ICD-10-CM

## 2015-11-09 MED ORDER — SULFAMETHOXAZOLE-TRIMETHOPRIM 800-160 MG PO TABS: 1.0000 | ORAL_TABLET | Freq: Two times a day (BID) | ORAL | 0 refills | Status: DC

## 2015-11-09 NOTE — Patient Instructions (Signed)
Follow up in 1 week, sooner if needed

## 2015-11-09 NOTE — Progress Notes (Signed)
BP 123/75 (BP Location: Left Arm, Patient Position: Sitting, Cuff Size: Normal)   Pulse 70   Temp 98.7 F (37.1 C)   Ht '6\' 1"'$  (1.854 m)   Wt 208 lb 12.8 oz (94.7 kg)   SpO2 95%   BMI 27.55 kg/m    Subjective:    Patient ID: KIANTE CIAVARELLA, male    DOB: 09/15/1945, 70 y.o.   MRN: [id number]  HPI: TYLIN FORCE is a 70 y.o. male  Chief Complaint  Patient presents with  . Recurrent Skin Infections    Testicle and on back   Patient presents with a 3 day history of painful bump on his left testicle. Started very small but got larger and busted this afternoon, draining a large amount of purulent material. Now also developing a smaller one underneath this place as well. Pain is tolerable, no fever or chills or surrounding redness/warmth noted. Has not been taking anything, just using neosporin.   Also has a painful red place on his left lower back from a tick bite, very red and swollen. States the tick bite happened about a week ago. Had another few tick bites about 6 weeks ago and had a rash soon after that appeared near the bites. That rash eventually cleared up with alcohol. Denies fever, fatigue, joint pain, ha's. Hx of Lyme disease so very worried about these.   Relevant past medical, surgical, family and social history reviewed and updated as indicated. Interim medical history since our last visit reviewed. Allergies and medications reviewed and updated.  Review of Systems  Constitutional: Negative.   HENT: Negative.   Respiratory: Negative.   Cardiovascular: Negative.   Gastrointestinal: Negative.   Genitourinary: Negative.   Musculoskeletal: Negative.   Skin:       As noted in HPI  Neurological: Negative.   Psychiatric/Behavioral: Negative.     Per HPI unless specifically indicated above     Objective:    BP 123/75 (BP Location: Left Arm, Patient Position: Sitting, Cuff Size: Normal)   Pulse 70   Temp 98.7 F (37.1 C)   Ht '6\' 1"'$  (1.854 m)   Wt 208 lb 12.8 oz  (94.7 kg)   SpO2 95%   BMI 27.55 kg/m   Wt Readings from Last 3 Encounters:  11/09/15 208 lb 12.8 oz (94.7 kg)  10/25/15 206 lb 3.2 oz (93.5 kg)  09/15/15 208 lb 3.2 oz (94.4 kg)    Physical Exam  Constitutional: He is oriented to person, place, and time. He appears well-developed and well-nourished. No distress.  HENT:  Head: Atraumatic.  Eyes: Conjunctivae are normal. No scleral icterus.  Neck: Normal range of motion. Neck supple.  Cardiovascular: Normal rate and normal heart sounds.   Pulmonary/Chest: Effort normal and breath sounds normal. No respiratory distress.  Musculoskeletal: Normal range of motion. He exhibits no tenderness.  Lymphadenopathy:    He has no cervical adenopathy.  Neurological: He is alert and oriented to person, place, and time.  Skin: Skin is warm and dry.  Inflamed, erythematous insect bite on left lower back with no active drainage or fluctuance  Two semi-fluctuant masses on left scrotal sac, larger one with opening from earlier drainage. No surrounding cellulitis.   Psychiatric: He has a normal mood and affect. His behavior is normal.  Nursing note and vitals reviewed.      Assessment & Plan:   Problem List Items Addressed This Visit    None    Visit Diagnoses    Abscess    -  Primary   Bactrim sent, warm compresses, neosporin. Patient opted to forgo I and D today and see if the abx cleared the areas. Will recheck in about a week   Tick bite       Asymptomatic, but will check for reassurance. Await results.    Relevant Orders   Lyme Ab/Western Blot Reflex   Rocky mtn spotted fvr ab, IgG-blood       Follow up plan: Return in about 1 week (around 11/16/2015) for Wound recheck.

## 2015-11-14 ENCOUNTER — Encounter: Payer: Self-pay | Admitting: Family Medicine

## 2015-11-16 ENCOUNTER — Ambulatory Visit (INDEPENDENT_AMBULATORY_CARE_PROVIDER_SITE_OTHER): Payer: Medicare Other | Admitting: Family Medicine

## 2015-11-16 ENCOUNTER — Encounter: Payer: Self-pay | Admitting: Family Medicine

## 2015-11-16 VITALS — BP 128/77 | HR 64 | Temp 98.8°F | Wt 208.0 lb

## 2015-11-16 DIAGNOSIS — L0291 Cutaneous abscess, unspecified: Secondary | ICD-10-CM | POA: Diagnosis not present

## 2015-11-16 DIAGNOSIS — A770 Spotted fever due to Rickettsia rickettsii: Secondary | ICD-10-CM | POA: Diagnosis not present

## 2015-11-16 LAB — ROCKY MTN SPOTTED FVR AB, IGG-BLOOD: RMSF IgG: UNDETERMINED

## 2015-11-16 LAB — RMSF, IGG, IFA: RMSF, IGG, IFA: 1:256 {titer} — ABNORMAL HIGH

## 2015-11-16 LAB — LYME AB/WESTERN BLOT REFLEX
LYME DISEASE AB, QUANT, IGM: <0.8 index (ref 0.00–0.79)
Lyme IgG/IgM Ab: <0.91 {ISR} (ref 0.00–0.90)

## 2015-11-16 MED ORDER — DOXYCYCLINE HYCLATE 100 MG PO TABS: 100.0000 mg | ORAL_TABLET | Freq: Two times a day (BID) | ORAL | 0 refills | Status: DC

## 2015-11-16 NOTE — Progress Notes (Signed)
   BP 128/77 (BP Location: Right Arm, Patient Position: Sitting)   Pulse 64   Temp 98.8 F (37.1 C)   Wt 208 lb (94.3 kg)   SpO2 96%   BMI 27.44 kg/m    Subjective:    Patient ID: Marcus Cortez, male    DOB: 1945/04/18, 70 y.o.   MRN: [id number]  HPI: Marcus Cortez is a 70 y.o. male  Chief Complaint  Patient presents with  . Wound Check    follow up on abcess. Patient finished his antibiotic this morning and states that it is gone.   Patient presents for wound recheck for several abscesses on his scrotum. He completed the bactrim with no adverse effects noted and with full resolution of wounds. Tick bite site on left low back also healing well. Denies fever, chills, but has noted some myalgias and fatigue.   Tested patient for tick disease at last visit as he had some bites earlier in the summer with a rash following them. Also hx of Lyme disease. Patient's RMSF titer came back positive this morning.   Relevant past medical, surgical, family and social history reviewed and updated as indicated. Interim medical history since our last visit reviewed. Allergies and medications reviewed and updated.  Review of Systems  Per HPI unless specifically indicated above     Objective:    BP 128/77 (BP Location: Right Arm, Patient Position: Sitting)   Pulse 64   Temp 98.8 F (37.1 C)   Wt 208 lb (94.3 kg)   SpO2 96%   BMI 27.44 kg/m   Wt Readings from Last 3 Encounters:  11/16/15 208 lb (94.3 kg)  11/09/15 208 lb 12.8 oz (94.7 kg)  10/25/15 206 lb 3.2 oz (93.5 kg)    Physical Exam  Results for orders placed or performed in visit on 11/09/15  Lyme Ab/Western Blot Reflex  Result Value Ref Range   Lyme IgG/IgM Ab <0.91 0.00 - 0.90 ISR   LYME DISEASE AB, QUANT, IGM <0.80 0.00 - 0.79 index  [facility] spotted fvr ab, IgG-blood  Result Value Ref Range   RMSF IgG Equivocal Negative  RMSF, IgG, IFA  Result Value Ref Range   RMSF, IGG, IFA 1:256 (H) Neg <1:64      Assessment  & Plan:   Problem List Items Addressed This Visit    None    Visit Diagnoses    Abscess    -  Primary   Resolved. Recommended 2-3 times weekly washing with hibiclens for prevention   [facility] spotted fever       Doxycycline sent for 10 day course, patient will follow up if still symptomatic after course completed.        Follow up plan: Return if symptoms worsen or fail to improve.

## 2015-11-16 NOTE — Patient Instructions (Signed)
Can purchase Hibiclens solution (pink liquid) at the drugstore or [facility] and wash the area 2-3 times weekly for prevention

## 2016-01-17 ENCOUNTER — Encounter (INDEPENDENT_AMBULATORY_CARE_PROVIDER_SITE_OTHER): Payer: Self-pay

## 2016-01-18 ENCOUNTER — Ambulatory Visit (INDEPENDENT_AMBULATORY_CARE_PROVIDER_SITE_OTHER): Payer: Medicare Other

## 2016-01-18 DIAGNOSIS — Z23 Encounter for immunization: Secondary | ICD-10-CM

## 2016-01-31 ENCOUNTER — Encounter: Payer: Self-pay | Admitting: Unknown Physician Specialty

## 2016-01-31 ENCOUNTER — Other Ambulatory Visit: Payer: Self-pay

## 2016-01-31 ENCOUNTER — Ambulatory Visit (INDEPENDENT_AMBULATORY_CARE_PROVIDER_SITE_OTHER): Payer: Medicare Other | Admitting: Unknown Physician Specialty

## 2016-01-31 VITALS — BP 133/85 | HR 65 | Temp 98.9°F | Ht 72.3 in | Wt 207.6 lb

## 2016-01-31 DIAGNOSIS — J3089 Other allergic rhinitis: Secondary | ICD-10-CM

## 2016-01-31 DIAGNOSIS — R351 Nocturia: Secondary | ICD-10-CM

## 2016-01-31 DIAGNOSIS — J339 Nasal polyp, unspecified: Secondary | ICD-10-CM | POA: Insufficient documentation

## 2016-01-31 DIAGNOSIS — E781 Pure hyperglyceridemia: Secondary | ICD-10-CM | POA: Diagnosis not present

## 2016-01-31 DIAGNOSIS — R7301 Impaired fasting glucose: Secondary | ICD-10-CM

## 2016-01-31 LAB — LIPID PANEL PICCOLO, WAIVED
CHOLESTEROL PICCOLO, WAIVED: 206 mg/dL — AB (ref <200)
Chol/HDL Ratio Piccolo,Waive: 5.2 mg/dL — ABNORMAL HIGH
HDL Chol Piccolo, Waived: 40 mg/dL — ABNORMAL LOW (ref >59)
LDL CHOL CALC PICCOLO WAIVED: 112 mg/dL — AB (ref <100)
Triglycerides Piccolo,Waived: 270 mg/dL — ABNORMAL HIGH (ref <150)
VLDL CHOL CALC PICCOLO,WAIVE: 54 mg/dL — AB (ref <30)

## 2016-01-31 LAB — BAYER DCA HB A1C WAIVED: HB A1C: 6.2 % (ref <7.0)

## 2016-01-31 LAB — HEMOGLOBIN A1C: Hemoglobin A1C: 6.2

## 2016-01-31 NOTE — Assessment & Plan Note (Signed)
Discussed diet changes.  Decrease Triglycerides

## 2016-01-31 NOTE — Progress Notes (Signed)
BP 133/85 (BP Location: Left Arm, Cuff Size: Normal)   Pulse 65   Temp 98.9 F (37.2 C)   Ht 6' 0.3" (1.836 m)   Wt 207 lb 9.6 oz (94.2 kg)   SpO2 91%   BMI 27.92 kg/m    Subjective:    Patient ID: Marcus Cortez, male    DOB: 1945-12-20, 70 y.o.   MRN: [id number]  HPI: Marcus Cortez is a 70 y.o. male  Chief Complaint  Patient presents with  . Medicare Wellness   Hyperlipidemia Watching what he eats.  Eating a lot of vegetables/fruit, and little meat.  Stopped Lopid and will restart if up again  Sinus drainage States his sinuses are constantly draining.  States it runs "like a river."  This is ongoing since last November.    Right knee Using [employer].  States he has arthritis and has a tear.    Functional Status Survey: Is the patient deaf or have difficulty hearing?: No Does the patient have difficulty seeing, even when wearing glasses/contacts?: No Does the patient have difficulty concentrating, remembering, or making decisions?: No Does the patient have difficulty walking or climbing stairs?: No Does the patient have difficulty dressing or bathing?: No Does the patient have difficulty doing errands alone such as visiting a doctor's office or shopping?: No  Depression screen [facility] 2/9 01/31/2016 04/25/2015  Decreased Interest 0 0  Down, Depressed, Hopeless 0 0  PHQ - 2 Score 0 0  Altered sleeping 0 -  Tired, decreased energy 0 -  Change in appetite 0 -  Feeling bad or failure about yourself  0 -  Trouble concentrating 0 -  Moving slowly or fidgety/restless 0 -  Suicidal thoughts 0 -  PHQ-9 Score 0 -   Fall Risk  01/31/2016 04/25/2015  Falls in the past year? No No    Relevant past medical, surgical, family and social history reviewed and updated as indicated. Interim medical history since our last visit reviewed. Allergies and medications reviewed and updated.  Review of Systems  Constitutional: Negative.   HENT: Positive for nosebleeds, postnasal drip and  rhinorrhea.   Eyes: Negative.   Respiratory: Negative.   Cardiovascular: Negative.   Gastrointestinal: Negative.   Endocrine: Negative.   Genitourinary:       Gets up twice at night to use the bathroom  Musculoskeletal: Positive for arthralgias. Negative for back pain.  Skin: Negative.   Neurological: Negative.   Hematological: Negative.   Psychiatric/Behavioral: Negative.     Per HPI unless specifically indicated above     Objective:    BP 133/85 (BP Location: Left Arm, Cuff Size: Normal)   Pulse 65   Temp 98.9 F (37.2 C)   Ht 6' 0.3" (1.836 m)   Wt 207 lb 9.6 oz (94.2 kg)   SpO2 91%   BMI 27.92 kg/m   Wt Readings from Last 3 Encounters:  01/31/16 207 lb 9.6 oz (94.2 kg)  11/16/15 208 lb (94.3 kg)  11/09/15 208 lb 12.8 oz (94.7 kg)    Physical Exam  Constitutional: He is oriented to person, place, and time. He appears well-developed and well-nourished.  HENT:  Head: Normocephalic.  Right Ear: Tympanic membrane, external ear and ear canal normal.  Left Ear: Tympanic membrane, external ear and ear canal normal.  Mouth/Throat: Uvula is midline, oropharynx is clear and moist and mucous membranes are normal.  Polyp left side of nose   Eyes: Pupils are equal, round, and reactive to light.  Cardiovascular: Normal rate, regular rhythm and normal heart sounds.  Exam reveals no gallop and no friction rub.   No murmur heard. Pulmonary/Chest: Effort normal and breath sounds normal. No respiratory distress.  Abdominal: Soft. Bowel sounds are normal. He exhibits no distension. There is no tenderness.  Musculoskeletal: Normal range of motion.  Neurological: He is alert and oriented to person, place, and time. He has normal reflexes.  Skin: Skin is warm and dry.  Psychiatric: He has a normal mood and affect. His behavior is normal. Judgment and thought content normal.      Assessment & Plan:   Problem List Items Addressed This Visit      Unprioritized   Allergic rhinitis     Hypertriglyceridemia - Primary    Discussed diet changes.  Decrease Triglycerides      Relevant Orders   Lipid Panel Piccolo, Waived   Comprehensive metabolic panel   IFG (impaired fasting glucose)    Hgb A1C 6.2.  Continue diet changes.   Discussed avoiding carbohydrates      Relevant Orders   Comprehensive metabolic panel   Bayer DCA Hb A1c Waived   Nasal polyp   Relevant Orders   Ambulatory referral to ENT   Nocturia   Relevant Orders   Comprehensive metabolic panel   PSA       Follow up plan: Return in about 3 months (around 05/02/2016) for tirglycerides and Hgb A1C.

## 2016-01-31 NOTE — Assessment & Plan Note (Signed)
Hgb A1C 6.2.  Continue diet changes.   Discussed avoiding carbohydrates

## 2016-02-01 LAB — COMPREHENSIVE METABOLIC PANEL
ALK PHOS: 71 IU/L (ref 39–117)
ALT: 18 IU/L (ref 0–44)
AST: 22 IU/L (ref 0–40)
Albumin/Globulin Ratio: 1.4 (ref 1.2–2.2)
Albumin: 4.6 g/dL (ref 3.5–4.8)
BUN/Creatinine Ratio: 11 (ref 10–24)
BUN: 12 mg/dL (ref 8–27)
Bilirubin Total: 1 mg/dL (ref 0.0–1.2)
CALCIUM: 9.1 mg/dL (ref 8.6–10.2)
CO2: 22 mmol/L (ref 18–29)
CREATININE: 1.1 mg/dL (ref 0.76–1.27)
Chloride: 101 mmol/L (ref 96–106)
GFR calc Af Amer: 78 mL/min/{1.73_m2} (ref >59)
GFR, EST NON AFRICAN AMERICAN: 67 mL/min/{1.73_m2} (ref >59)
GLUCOSE: 97 mg/dL (ref 65–99)
Globulin, Total: 3.2 g/dL (ref 1.5–4.5)
POTASSIUM: 4.1 mmol/L (ref 3.5–5.2)
Sodium: 140 mmol/L (ref 134–144)
Total Protein: 7.8 g/dL (ref 6.0–8.5)

## 2016-02-01 LAB — PSA: Prostate Specific Ag, Serum: 1.4 ng/mL (ref 0.0–4.0)

## 2016-02-05 ENCOUNTER — Encounter: Payer: Self-pay | Admitting: Unknown Physician Specialty

## 2016-02-22 DIAGNOSIS — J329 Chronic sinusitis, unspecified: Secondary | ICD-10-CM | POA: Diagnosis not present

## 2016-02-22 DIAGNOSIS — J301 Allergic rhinitis due to pollen: Secondary | ICD-10-CM | POA: Diagnosis not present

## 2016-03-11 DIAGNOSIS — C801 Malignant (primary) neoplasm, unspecified: Secondary | ICD-10-CM

## 2016-03-11 HISTORY — DX: Malignant (primary) neoplasm, unspecified: C80.1

## 2016-03-21 ENCOUNTER — Other Ambulatory Visit: Payer: Self-pay | Admitting: Otolaryngology

## 2016-03-21 ENCOUNTER — Ambulatory Visit
Admission: RE | Admit: 2016-03-21 | Discharge: 2016-03-21 | Disposition: A | Discharge status: Home / Self Care | Payer: Medicare HMO | Source: Ambulatory Visit | Attending: Otolaryngology | Admitting: Otolaryngology

## 2016-03-21 DIAGNOSIS — J410 Simple chronic bronchitis: Secondary | ICD-10-CM | POA: Diagnosis not present

## 2016-03-21 DIAGNOSIS — R0602 Shortness of breath: Secondary | ICD-10-CM | POA: Diagnosis not present

## 2016-03-21 DIAGNOSIS — R059 Cough, unspecified: Secondary | ICD-10-CM

## 2016-03-21 DIAGNOSIS — R05 Cough: Secondary | ICD-10-CM

## 2016-03-21 DIAGNOSIS — R9389 Abnormal findings on diagnostic imaging of other specified body structures: Secondary | ICD-10-CM

## 2016-03-21 DIAGNOSIS — R918 Other nonspecific abnormal finding of lung field: Secondary | ICD-10-CM | POA: Insufficient documentation

## 2016-03-21 DIAGNOSIS — J329 Chronic sinusitis, unspecified: Secondary | ICD-10-CM | POA: Diagnosis not present

## 2016-03-28 ENCOUNTER — Ambulatory Visit
Admission: RE | Admit: 2016-03-28 | Discharge: 2016-03-28 | Disposition: A | Discharge status: Home / Self Care | Payer: Medicare HMO | Source: Ambulatory Visit | Attending: Otolaryngology | Admitting: Otolaryngology

## 2016-04-02 ENCOUNTER — Ambulatory Visit
Admission: RE | Admit: 2016-04-02 | Discharge: 2016-04-02 | Disposition: A | Discharge status: Home / Self Care | Payer: Medicare HMO | Source: Ambulatory Visit | Attending: Otolaryngology | Admitting: Otolaryngology

## 2016-04-02 DIAGNOSIS — J90 Pleural effusion, not elsewhere classified: Secondary | ICD-10-CM | POA: Diagnosis not present

## 2016-04-02 DIAGNOSIS — R918 Other nonspecific abnormal finding of lung field: Secondary | ICD-10-CM | POA: Insufficient documentation

## 2016-04-02 DIAGNOSIS — I700 Atherosclerosis of aorta: Secondary | ICD-10-CM | POA: Diagnosis not present

## 2016-04-02 DIAGNOSIS — J181 Lobar pneumonia, unspecified organism: Secondary | ICD-10-CM | POA: Diagnosis not present

## 2016-04-02 DIAGNOSIS — I2510 Atherosclerotic heart disease of native coronary artery without angina pectoris: Secondary | ICD-10-CM | POA: Insufficient documentation

## 2016-04-02 DIAGNOSIS — R9389 Abnormal findings on diagnostic imaging of other specified body structures: Secondary | ICD-10-CM

## 2016-04-02 LAB — POCT I-STAT CREATININE: Creatinine, Ser: 1.2 mg/dL (ref 0.61–1.24)

## 2016-04-02 MED ORDER — IOPAMIDOL (ISOVUE-300) INJECTION 61%
75.0000 mL | Freq: Once | INTRAVENOUS | Status: AC | PRN
  Administered 2016-04-02: 75 mL via INTRAVENOUS

## 2016-04-03 ENCOUNTER — Other Ambulatory Visit: Payer: Self-pay | Admitting: Otolaryngology

## 2016-04-03 DIAGNOSIS — C3490 Malignant neoplasm of unspecified part of unspecified bronchus or lung: Secondary | ICD-10-CM

## 2016-04-05 ENCOUNTER — Encounter: Payer: Self-pay | Admitting: Pulmonary Disease

## 2016-04-05 ENCOUNTER — Ambulatory Visit (INDEPENDENT_AMBULATORY_CARE_PROVIDER_SITE_OTHER): Payer: Medicare HMO | Admitting: Pulmonary Disease

## 2016-04-05 VITALS — BP 150/90 | HR 79 | Wt 205.0 lb

## 2016-04-05 DIAGNOSIS — Z87891 Personal history of nicotine dependence: Secondary | ICD-10-CM | POA: Diagnosis not present

## 2016-04-05 DIAGNOSIS — R918 Other nonspecific abnormal finding of lung field: Secondary | ICD-10-CM

## 2016-04-05 DIAGNOSIS — R0609 Other forms of dyspnea: Secondary | ICD-10-CM

## 2016-04-05 DIAGNOSIS — R0600 Dyspnea, unspecified: Secondary | ICD-10-CM

## 2016-04-05 NOTE — Addendum Note (Signed)
Addended by: Oscar La R on: 04/05/2016 09:24 AM   Modules accepted: Orders

## 2016-04-05 NOTE — Patient Instructions (Signed)
1) Bronchoscopy next week 2) PET scan to determine extent of cancer 3) Full lung function tests  We will call you with results of the above tests and make the proper referrals to Oncology and/or surgery depending on the results of the above tests

## 2016-04-05 NOTE — Progress Notes (Signed)
PULMONARY CONSULT NOTE  Requesting MD/Service: Richardson Landry, ENT Date of initial consultation: 04/05/16 Reason for consultation: Cough, new finding of lung mass  PT PROFILE: 33 M smoker recently evaluated by Dr Richardson Landry for cough and chronic sinusitis. Dr Richardson Landry noted abnormal lung exam findings and obtained CXR, then CT chest revealing L lung mass  HPI:  As above. He is a former smoker who quit for good last year. Previously, he had abstained for a 16 yrs stretch. He first noted mild DOE approx a year ago. He has cough which is minimally productive. He denies CP, fever, purulent sputum, hemoptysis, unexplained weight loss, LE edema and calf tenderness. Overall he remains active and vigorous.   Past Medical History:  Diagnosis Date  . Allergy   . Hyperlipidemia   . IFG (impaired fasting glucose)   . Osteoporosis     Past Surgical History:  Procedure Laterality Date  . EYE SURGERY  2010   cataract    MEDICATIONS: I have reviewed all medications and confirmed regimen as documented  Social History   Social History  . Marital status: Widowed    Spouse name: N/A  . Number of children: N/A  . Years of education: N/A   Occupational History  . Not on file.   Social History Main Topics  . Smoking status: Former Research scientist (life sciences)  . Smokeless tobacco: Former Systems developer  . Alcohol use No  . Drug use: No  . Sexual activity: Yes   Other Topics Concern  . Not on file   Social History Narrative  . No narrative on file    Family History  Problem Relation Age of Onset  . Hypertension Mother   . Thyroid disease Mother   . Glaucoma Mother   . Alzheimer's disease Father   . Stroke Maternal Grandmother   . Cancer Maternal Grandfather   . Alzheimer's disease Paternal Grandmother   . Stroke Paternal Grandfather     ROS: No fever, myalgias/arthralgias, unexplained weight loss or weight gain No new focal weakness or sensory deficits No otalgia, hearing loss, visual changes, nasal and sinus  symptoms, mouth and throat problems No neck pain or adenopathy No abdominal pain, N/V/D, diarrhea, change in bowel pattern No dysuria, change in urinary pattern   There were no vitals filed for this visit.   EXAM:  Gen: WDWN, No overt respiratory distress HEENT: NCAT, sclera white, oropharynx normal Neck: Supple without LAN, thyromegaly, JVD Lungs: breath sounds full without adventitious sounds Cardiovascular: RRR, II/VI SEM Abdomen: Soft, nontender, normal BS Ext: without clubbing, cyanosis, edema Neuro: CNs grossly intact, motor and sensory intact Skin: Limited exam, no lesions noted  DATA:   BMP Latest Ref Rng & Units 04/02/2016 01/31/2016 10/25/2015  Glucose 65 - 99 mg/dL - 97 92  BUN 8 - 27 mg/dL - 12 13  Creatinine 0.61 - 1.24 mg/dL 1.20 1.10 1.11  BUN/Creat Ratio 10 - 24 - 11 12  Sodium 134 - 144 mmol/L - 140 140  Potassium 3.5 - 5.2 mmol/L - 4.1 4.3  Chloride 96 - 106 mmol/L - 101 99  CO2 18 - 29 mmol/L - 22 21  Calcium 8.6 - 10.2 mg/dL - 9.1 9.5    No flowsheet data found.  CXR 03/21/16:  L supra hilar opacity CT chest 04/02/16: L supra hilar mass with atelectasis  IMPRESSION:     ICD-9-CM ICD-10-CM   1. Lung mass 786.6 R91.8   2. Former smoker V15.82 Z87.891      PLAN:  1) Bronchoscopy next  week - I discussed in detail, the indication, risks and alternatives and he agrees to proceed 2) Full PFTs next week 3) PET scan next week  We will call him with results of the above tests and make the proper referrals to Oncology and/or surgery depending on the results  Merton Border, MD PCCM service Mobile [phone] Pager [phone] 04/05/2016

## 2016-04-08 ENCOUNTER — Ambulatory Visit (INDEPENDENT_AMBULATORY_CARE_PROVIDER_SITE_OTHER): Payer: Self-pay | Admitting: Unknown Physician Specialty

## 2016-04-08 ENCOUNTER — Encounter: Payer: Self-pay | Admitting: Unknown Physician Specialty

## 2016-04-08 DIAGNOSIS — I10 Essential (primary) hypertension: Secondary | ICD-10-CM | POA: Insufficient documentation

## 2016-04-08 MED ORDER — AMLODIPINE BESYLATE 5 MG PO TABS: 5.0000 mg | ORAL_TABLET | Freq: Every day | ORAL | 1 refills | Status: DC

## 2016-04-08 NOTE — Progress Notes (Signed)
BP (!) 173/89 (BP Location: Left Arm, Cuff Size: Normal)   Pulse 79   Temp 97.6 F (36.4 C)   Ht 6' 1.3" (1.862 m) Comment: pt had shoes on  Wt 203 lb 9.6 oz (92.4 kg) Comment: pt had shoes on  SpO2 97%   BMI 26.64 kg/m    Subjective:    Patient ID: Marcus Cortez, male    DOB: [birth year], 71 y.o.   MRN: [id number]  HPI: Marcus Cortez is a 71 y.o. male  Chief Complaint  Patient presents with  . Hypertension    pt states his BP has been running high for a few days, states it has been around 170's. States he was diagosed with lung cancer last week and that he has been stressing about it.    Hypertension Patient presents to clinic for high blood pressure that he has been experiencing for the past week or so. States that he started noticing headaches bout 6 days ago, after receiving CT results confirming a lung cancer diagnosis. Reports that his blood pressure in the office that day was around 150's/70s-80s mmHg and that he began regularly checking his blood pressure at home after this. Since then his blood pressure has gradually  increased into the 170s/80s mmHg. Notes that he has been experiencing higher levels of anxiety since his diagnosis. Denies dizziness, light headedness, edema or chest pain. Admits to fatigue, a cough and some shortness of breath and wheezing with exertion. He notes these symptoms began several months ago and have hindered his activity level.  Relevant past medical, surgical, family and social history reviewed and updated as indicated. Interim medical history since our last visit reviewed. Allergies and medications reviewed and updated.  Review of Systems  Constitutional: Positive for fatigue. Negative for appetite change and unexpected weight change.  Respiratory: Positive for cough, shortness of breath and wheezing. Negative for chest tightness.   Cardiovascular: Negative for chest pain, palpitations and leg swelling.  Gastrointestinal: Negative for  abdominal pain, diarrhea, nausea and vomiting.  Endocrine: Negative for polydipsia and polyuria.  Genitourinary: Negative.   Musculoskeletal: Negative.   Neurological: Positive for headaches. Negative for dizziness, syncope, weakness and light-headedness.  Psychiatric/Behavioral: The patient is nervous/anxious.     Per HPI unless specifically indicated above     Objective:    BP (!) 173/89 (BP Location: Left Arm, Cuff Size: Normal)   Pulse 79   Temp 97.6 F (36.4 C)   Ht 6' 1.3" (1.862 m) Comment: pt had shoes on  Wt 203 lb 9.6 oz (92.4 kg) Comment: pt had shoes on  SpO2 97%   BMI 26.64 kg/m   Wt Readings from Last 3 Encounters:  04/08/16 203 lb 9.6 oz (92.4 kg)  04/05/16 205 lb (93 kg)  01/31/16 207 lb 9.6 oz (94.2 kg)    Physical Exam  Constitutional: He is oriented to person, place, and time. He appears well-developed and well-nourished.  HENT:  Head: Normocephalic and atraumatic.  Eyes: Conjunctivae are normal. Right eye exhibits no discharge. Left eye exhibits no discharge. No scleral icterus.  Cardiovascular: Normal rate, regular rhythm and normal heart sounds.   Pulmonary/Chest: Effort normal. He has wheezes in the right upper field and the right middle field.  Neurological: He is alert and oriented to person, place, and time.  Skin: Skin is warm and dry.  Psychiatric: He has a normal mood and affect. His behavior is normal. Judgment and thought content normal.  Nursing note and vitals reviewed.  Results for orders placed or performed during the hospital encounter of 04/02/16  I-STAT creatinine  Result Value Ref Range   Creatinine, Ser 1.20 0.61 - 1.24 mg/dL      Assessment & Plan:   Problem List Items Addressed This Visit      Cardiovascular and Mediastinum   Hypertension    Patient started on 5 mg amlodipine. Will return in 1 month for follow-up.      Relevant Medications   amLODipine (NORVASC) 5 MG tablet       Follow up plan: Return in about 4  weeks (around 05/06/2016).

## 2016-04-08 NOTE — Assessment & Plan Note (Signed)
Patient started on 5 mg amlodipine. Will return in 1 month for follow-up.

## 2016-04-09 ENCOUNTER — Institutional Professional Consult (permissible substitution): Payer: Medicare HMO | Admitting: Internal Medicine

## 2016-04-09 ENCOUNTER — Ambulatory Visit (HOSPITAL_COMMUNITY): Payer: Medicare HMO

## 2016-04-09 ENCOUNTER — Encounter
Admission: RE | Admit: 2016-04-09 | Discharge: 2016-04-09 | Disposition: A | Discharge status: Home / Self Care | Payer: Medicare HMO | Source: Ambulatory Visit | Attending: Pulmonary Disease | Admitting: Pulmonary Disease

## 2016-04-09 DIAGNOSIS — R918 Other nonspecific abnormal finding of lung field: Secondary | ICD-10-CM

## 2016-04-09 LAB — GLUCOSE, CAPILLARY: GLUCOSE-CAPILLARY: 106 mg/dL — AB (ref 65–99)

## 2016-04-09 MED ORDER — ALBUTEROL SULFATE (2.5 MG/3ML) 0.083% IN NEBU
2.5000 mg | INHALATION_SOLUTION | Freq: Once | RESPIRATORY_TRACT | Status: AC
  Administered 2016-04-09: 2.5 mg via RESPIRATORY_TRACT
  Filled 2016-04-09: qty 3

## 2016-04-09 MED ORDER — FLUDEOXYGLUCOSE F - 18 (FDG) INJECTION
12.2500 | Freq: Once | INTRAVENOUS | Status: AC | PRN
  Administered 2016-04-09: 12.25 via INTRAVENOUS

## 2016-04-10 ENCOUNTER — Encounter: Payer: Self-pay | Admitting: *Deleted

## 2016-04-10 ENCOUNTER — Encounter
Admission: RE | Disposition: A | Discharge status: Home / Self Care | Payer: Self-pay | Source: Ambulatory Visit | Attending: Pulmonary Disease

## 2016-04-10 ENCOUNTER — Ambulatory Visit
Admission: RE | Admit: 2016-04-10 | Discharge: 2016-04-10 | Disposition: A | Discharge status: Home / Self Care | Payer: Medicare HMO | Source: Ambulatory Visit | Attending: Pulmonary Disease | Admitting: Pulmonary Disease

## 2016-04-10 DIAGNOSIS — J329 Chronic sinusitis, unspecified: Secondary | ICD-10-CM | POA: Insufficient documentation

## 2016-04-10 DIAGNOSIS — R918 Other nonspecific abnormal finding of lung field: Secondary | ICD-10-CM | POA: Diagnosis not present

## 2016-04-10 DIAGNOSIS — E785 Hyperlipidemia, unspecified: Secondary | ICD-10-CM | POA: Diagnosis not present

## 2016-04-10 DIAGNOSIS — Z87891 Personal history of nicotine dependence: Secondary | ICD-10-CM | POA: Diagnosis not present

## 2016-04-10 DIAGNOSIS — R05 Cough: Secondary | ICD-10-CM | POA: Diagnosis not present

## 2016-04-10 DIAGNOSIS — R222 Localized swelling, mass and lump, trunk: Secondary | ICD-10-CM | POA: Diagnosis not present

## 2016-04-10 DIAGNOSIS — J42 Unspecified chronic bronchitis: Secondary | ICD-10-CM | POA: Insufficient documentation

## 2016-04-10 DIAGNOSIS — Z7982 Long term (current) use of aspirin: Secondary | ICD-10-CM | POA: Diagnosis not present

## 2016-04-10 DIAGNOSIS — Z7951 Long term (current) use of inhaled steroids: Secondary | ICD-10-CM | POA: Insufficient documentation

## 2016-04-10 HISTORY — PX: FLEXIBLE BRONCHOSCOPY: SHX5094

## 2016-04-10 SURGERY — BRONCHOSCOPY, FLEXIBLE
Anesthesia: Moderate Sedation

## 2016-04-10 MED ORDER — BUTAMBEN-TETRACAINE-BENZOCAINE 2-2-14 % EX AERO
1.0000 | INHALATION_SPRAY | Freq: Once | CUTANEOUS | Status: DC
  Filled 2016-04-10: qty 20

## 2016-04-10 MED ORDER — MIDAZOLAM HCL 5 MG/5ML IJ SOLN
INTRAMUSCULAR | Status: AC
  Filled 2016-04-10: qty 10

## 2016-04-10 MED ORDER — FENTANYL CITRATE (PF) 100 MCG/2ML IJ SOLN
INTRAMUSCULAR | Status: AC
  Filled 2016-04-10: qty 4

## 2016-04-10 MED ORDER — PHENYLEPHRINE HCL 0.25 % NA SOLN
1.0000 | Freq: Four times a day (QID) | NASAL | Status: DC | PRN
  Filled 2016-04-10: qty 15

## 2016-04-10 MED ORDER — BUTAMBEN-TETRACAINE-BENZOCAINE 2-2-14 % EX AERO
INHALATION_SPRAY | CUTANEOUS | Status: AC
  Filled 2016-04-10: qty 20

## 2016-04-10 MED ORDER — FENTANYL CITRATE (PF) 100 MCG/2ML IJ SOLN
INTRAMUSCULAR | Status: AC | PRN
  Administered 2016-04-10: 50 ug via INTRAVENOUS

## 2016-04-10 MED ORDER — LIDOCAINE HCL 2 % EX GEL: 1.0000 "application " | Freq: Once | CUTANEOUS | Status: DC

## 2016-04-10 MED ORDER — MIDAZOLAM HCL 2 MG/2ML IJ SOLN
INTRAMUSCULAR | Status: AC | PRN
  Administered 2016-04-10: 4 mg via INTRAVENOUS

## 2016-04-10 NOTE — Interval H&P Note (Signed)
History and Physical Interval Note:  04/10/2016 3:45 PM  Marcus Cortez  has presented today for surgery, with the diagnosis of Flexible Bronchoscopy    Lung mass Need [facility] per [facility]  The various methods of treatment have been discussed with the patient and family. After consideration of risks, benefits and other options for treatment, the patient has consented to  Procedure(s): FLEXIBLE BRONCHOSCOPY (N/A) as a surgical intervention .  The patient's history has been reviewed, patient examined, no change in status, stable for surgery.  I have reviewed the patient's chart and labs.  Questions were answered to the patient's satisfaction.     Wilhelmina Mcardle

## 2016-04-10 NOTE — H&P (View-Only) (Signed)
PULMONARY CONSULT NOTE  Requesting MD/Service: Richardson Landry, ENT Date of initial consultation: 04/05/16 Reason for consultation: Cough, new finding of lung mass  PT PROFILE: 25 M smoker recently evaluated by Dr Richardson Landry for cough and chronic sinusitis. Dr Richardson Landry noted abnormal lung exam findings and obtained CXR, then CT chest revealing L lung mass  HPI:  As above. He is a former smoker who quit for good last year. Previously, he had abstained for a 16 yrs stretch. He first noted mild DOE approx a year ago. He has cough which is minimally productive. He denies CP, fever, purulent sputum, hemoptysis, unexplained weight loss, LE edema and calf tenderness. Overall he remains active and vigorous.   Past Medical History:  Diagnosis Date  . Allergy   . Hyperlipidemia   . IFG (impaired fasting glucose)   . Osteoporosis     Past Surgical History:  Procedure Laterality Date  . EYE SURGERY  2010   cataract    MEDICATIONS: I have reviewed all medications and confirmed regimen as documented  Social History   Social History  . Marital status: Widowed    Spouse name: N/A  . Number of children: N/A  . Years of education: N/A   Occupational History  . Not on file.   Social History Main Topics  . Smoking status: Former Research scientist (life sciences)  . Smokeless tobacco: Former Systems developer  . Alcohol use No  . Drug use: No  . Sexual activity: Yes   Other Topics Concern  . Not on file   Social History Narrative  . No narrative on file    Family History  Problem Relation Age of Onset  . Hypertension Mother   . Thyroid disease Mother   . Glaucoma Mother   . Alzheimer's disease Father   . Stroke Maternal Grandmother   . Cancer Maternal Grandfather   . Alzheimer's disease Paternal Grandmother   . Stroke Paternal Grandfather     ROS: No fever, myalgias/arthralgias, unexplained weight loss or weight gain No new focal weakness or sensory deficits No otalgia, hearing loss, visual changes, nasal and sinus  symptoms, mouth and throat problems No neck pain or adenopathy No abdominal pain, N/V/D, diarrhea, change in bowel pattern No dysuria, change in urinary pattern   There were no vitals filed for this visit.   EXAM:  Gen: WDWN, No overt respiratory distress HEENT: NCAT, sclera white, oropharynx normal Neck: Supple without LAN, thyromegaly, JVD Lungs: breath sounds full without adventitious sounds Cardiovascular: RRR, II/VI SEM Abdomen: Soft, nontender, normal BS Ext: without clubbing, cyanosis, edema Neuro: CNs grossly intact, motor and sensory intact Skin: Limited exam, no lesions noted  DATA:   BMP Latest Ref Rng & Units 04/02/2016 01/31/2016 10/25/2015  Glucose 65 - 99 mg/dL - 97 92  BUN 8 - 27 mg/dL - 12 13  Creatinine 0.61 - 1.24 mg/dL 1.20 1.10 1.11  BUN/Creat Ratio 10 - 24 - 11 12  Sodium 134 - 144 mmol/L - 140 140  Potassium 3.5 - 5.2 mmol/L - 4.1 4.3  Chloride 96 - 106 mmol/L - 101 99  CO2 18 - 29 mmol/L - 22 21  Calcium 8.6 - 10.2 mg/dL - 9.1 9.5    No flowsheet data found.  CXR 03/21/16:  L supra hilar opacity CT chest 04/02/16: L supra hilar mass with atelectasis  IMPRESSION:     ICD-9-CM ICD-10-CM   1. Lung mass 786.6 R91.8   2. Former smoker V15.82 Z87.891      PLAN:  1) Bronchoscopy next  week - I discussed in detail, the indication, risks and alternatives and he agrees to proceed 2) Full PFTs next week 3) PET scan next week  We will call him with results of the above tests and make the proper referrals to Oncology and/or surgery depending on the results  Merton Border, MD PCCM service Mobile [phone] Pager [phone] 04/05/2016

## 2016-04-10 NOTE — H&P (Signed)
PROCEDURE HISTORY AND PHYSICAL  Requesting MD/Service: Richardson Landry, ENT Date of initial consultation: 04/05/16 Reason for consultation: Cough, new finding of lung mass  PT PROFILE: 89 M smoker recently evaluated by Dr Richardson Landry for cough and chronic sinusitis. Dr Richardson Landry noted abnormal lung exam findings and obtained CXR, then CT chest revealing L lung mass  HPI:  As above. He is a former smoker who quit for good last year. Previously, he had abstained for a 16 yrs stretch. He first noted mild DOE approx a year ago. He has cough which is minimally productive. He denies CP, fever, purulent sputum, hemoptysis, unexplained weight loss, LE edema and calf tenderness. Overall he remains active and vigorous.       Past Medical History:  Diagnosis Date  . Allergy   . Hyperlipidemia   . IFG (impaired fasting glucose)   . Osteoporosis     Past Surgical History:  Procedure Laterality Date  . EYE SURGERY  2010   cataract    MEDICATIONS: I have reviewed all medications and confirmed regimen as documented  Social History        Social History  . Marital status: Widowed    Spouse name: N/A  . Number of children: N/A  . Years of education: N/A      Occupational History  . Not on file.       Social History Main Topics  . Smoking status: Former Research scientist (life sciences)  . Smokeless tobacco: Former Systems developer  . Alcohol use No  . Drug use: No  . Sexual activity: Yes       Other Topics Concern  . Not on file      Social History Narrative  . No narrative on file         Family History  Problem Relation Age of Onset  . Hypertension Mother   . Thyroid disease Mother   . Glaucoma Mother   . Alzheimer's disease Father   . Stroke Maternal Grandmother   . Cancer Maternal Grandfather   . Alzheimer's disease Paternal Grandmother   . Stroke Paternal Grandfather     ROS: No fever, myalgias/arthralgias, unexplained weight loss or weight gain No new focal weakness or  sensory deficits No otalgia, hearing loss, visual changes, nasal and sinus symptoms, mouth and throat problems No neck pain or adenopathy No abdominal pain, N/V/D, diarrhea, change in bowel pattern No dysuria, change in urinary pattern   There were no vitals filed for this visit.   EXAM:  Gen: WDWN, No overt respiratory distress HEENT: NCAT, sclera white, oropharynx normal Neck: Supple without LAN, thyromegaly, JVD Lungs: breath sounds full without adventitious sounds Cardiovascular: RRR, II/VI SEM Abdomen: Soft, nontender, normal BS Ext: without clubbing, cyanosis, edema Neuro: CNs grossly intact, motor and sensory intact Skin: Limited exam, no lesions noted  DATA:   BMP Latest Ref Rng & Units 04/02/2016 01/31/2016 10/25/2015  Glucose 65 - 99 mg/dL - 97 92  BUN 8 - 27 mg/dL - 12 13  Creatinine 0.61 - 1.24 mg/dL 1.20 1.10 1.11  BUN/Creat Ratio 10 - 24 - 11 12  Sodium 134 - 144 mmol/L - 140 140  Potassium 3.5 - 5.2 mmol/L - 4.1 4.3  Chloride 96 - 106 mmol/L - 101 99  CO2 18 - 29 mmol/L - 22 21  Calcium 8.6 - 10.2 mg/dL - 9.1 9.5    No flowsheet data found.  CXR 03/21/16:  L supra hilar opacity CT chest 04/02/16: L supra hilar mass with atelectasis  IMPRESSION:  ICD-9-CM ICD-10-CM   1. Lung mass 786.6 R91.8   2. Former smoker V15.82 Z87.891      PLAN:  1) Bronchoscopy next week - I discussed in detail, the indication, risks and alternatives and he agrees to proceed 2) Full PFTs next week 3) PET scan next week  We will call him with results of the above tests and make the proper referrals to Oncology and/or surgery depending on the results  Merton Border, MD PCCM service Mobile [phone] Pager [phone] 04/05/2016

## 2016-04-10 NOTE — Procedures (Signed)
Indication:   LUL mass  Premedication:   Fentanyl 50 mcg Midaz 4 mg  Anesthesia: Topical to nose and throat 30 cc of 1% lidocaine used during the course of procedure  Procedure: After adequate sedation and anesthesia, the bronchoscope was introduced via the L naris and advanced into the posterior pharynx. Further anesthesia was obtained with 1% lidocaine and the scope was advanced into the trachea. Complete airway anesthesia was achieved with 1% lidocaine and a thorough airway examination was performed. This revealed the following findings:  Findings:  Upper airway - normal Tracheobronchial anatomy - normal Bronchial mucosa - diffuse chronic bronchitis Other - There were no endobronchial tumors, masses or foreign bodies see. Mild to moderate mucoid secretions throughout  All specimens were sent for cytology/surgical pathology  Specimens:   BAL from LUL Cytology brushings from LUL  Endobronchial biopsy from LUL (the biopsy forceps were advanced just past the point of direct visualization   Complications: None  Post procedure evaluation:  The patient tolerated the procedure well with no major complications CXR - not indicated   Merton Border, MD PCCM service Mobile [phone] Pager [phone]  04/10/2016

## 2016-04-10 NOTE — Discharge Instructions (Signed)
Flexible Bronchoscopy, Care After These instructions give you information on caring for yourself after your procedure. Your doctor may also give you more specific instructions. Call your doctor if you have any problems or questions after your procedure. Follow these instructions at home:  Do not eat or drink anything for 2 hours after your procedure. If you try to eat or drink before the medicine wears off, food or drink could go into your lungs. You could also burn yourself.  After 2 hours have passed and when you can cough and gag normally, you may eat soft food and drink liquids slowly.  The day after the test, you may eat your normal diet.  You may do your normal activities.  Keep all doctor visits. Get help right away if:  You get more and more short of breath.  You get light-headed.  You feel like you are going to pass out (faint).  You have chest pain.  You have new problems that worry you.  You cough up more than a little blood.  You cough up more blood than before. This information is not intended to replace advice given to you by your health care provider. Make sure you discuss any questions you have with your health care provider. Document Released: 12/23/2008 Document Revised: 08/03/2015 Document Reviewed: 10/30/2012 Elsevier Interactive Patient Education  2017 [employer].

## 2016-04-11 ENCOUNTER — Ambulatory Visit: Payer: Medicare HMO

## 2016-04-11 ENCOUNTER — Other Ambulatory Visit: Payer: Medicare HMO

## 2016-04-11 ENCOUNTER — Encounter: Payer: Self-pay | Admitting: Pulmonary Disease

## 2016-04-11 LAB — SURGICAL PATHOLOGY

## 2016-04-11 LAB — CYTOLOGY - NON PAP

## 2016-04-12 ENCOUNTER — Encounter: Payer: Self-pay | Admitting: Internal Medicine

## 2016-04-12 ENCOUNTER — Inpatient Hospital Stay: Payer: Medicare HMO | Attending: Internal Medicine | Admitting: Internal Medicine

## 2016-04-12 ENCOUNTER — Telehealth: Payer: Self-pay | Admitting: *Deleted

## 2016-04-12 ENCOUNTER — Inpatient Hospital Stay: Payer: Medicare HMO

## 2016-04-12 DIAGNOSIS — R918 Other nonspecific abnormal finding of lung field: Secondary | ICD-10-CM

## 2016-04-12 DIAGNOSIS — M810 Age-related osteoporosis without current pathological fracture: Secondary | ICD-10-CM | POA: Insufficient documentation

## 2016-04-12 DIAGNOSIS — E785 Hyperlipidemia, unspecified: Secondary | ICD-10-CM

## 2016-04-12 DIAGNOSIS — Z87891 Personal history of nicotine dependence: Secondary | ICD-10-CM | POA: Diagnosis not present

## 2016-04-12 DIAGNOSIS — D696 Thrombocytopenia, unspecified: Secondary | ICD-10-CM | POA: Diagnosis not present

## 2016-04-12 DIAGNOSIS — J90 Pleural effusion, not elsewhere classified: Secondary | ICD-10-CM | POA: Insufficient documentation

## 2016-04-12 DIAGNOSIS — Z7982 Long term (current) use of aspirin: Secondary | ICD-10-CM | POA: Diagnosis not present

## 2016-04-12 DIAGNOSIS — C3412 Malignant neoplasm of upper lobe, left bronchus or lung: Secondary | ICD-10-CM | POA: Insufficient documentation

## 2016-04-12 DIAGNOSIS — I10 Essential (primary) hypertension: Secondary | ICD-10-CM | POA: Diagnosis not present

## 2016-04-12 DIAGNOSIS — J439 Emphysema, unspecified: Secondary | ICD-10-CM | POA: Insufficient documentation

## 2016-04-12 DIAGNOSIS — I6523 Occlusion and stenosis of bilateral carotid arteries: Secondary | ICD-10-CM | POA: Diagnosis not present

## 2016-04-12 DIAGNOSIS — Z809 Family history of malignant neoplasm, unspecified: Secondary | ICD-10-CM | POA: Insufficient documentation

## 2016-04-12 DIAGNOSIS — Z77090 Contact with and (suspected) exposure to asbestos: Secondary | ICD-10-CM | POA: Diagnosis not present

## 2016-04-12 DIAGNOSIS — E279 Disorder of adrenal gland, unspecified: Secondary | ICD-10-CM | POA: Insufficient documentation

## 2016-04-12 DIAGNOSIS — J329 Chronic sinusitis, unspecified: Secondary | ICD-10-CM

## 2016-04-12 DIAGNOSIS — Z79899 Other long term (current) drug therapy: Secondary | ICD-10-CM | POA: Diagnosis not present

## 2016-04-12 DIAGNOSIS — R0602 Shortness of breath: Secondary | ICD-10-CM

## 2016-04-12 DIAGNOSIS — I700 Atherosclerosis of aorta: Secondary | ICD-10-CM | POA: Insufficient documentation

## 2016-04-12 DIAGNOSIS — R51 Headache: Secondary | ICD-10-CM | POA: Insufficient documentation

## 2016-04-12 LAB — C-REACTIVE PROTEIN: CRP: 1.7 mg/dL — ABNORMAL HIGH (ref <1.0)

## 2016-04-12 LAB — CBC WITH DIFFERENTIAL/PLATELET
Basophils Absolute: 0.1 10*3/uL (ref 0–0.1)
Basophils Relative: 1 %
Eosinophils Absolute: 0.2 10*3/uL (ref 0–0.7)
Eosinophils Relative: 2 %
HEMATOCRIT: 42.9 % (ref 40.0–52.0)
Hemoglobin: 14.9 g/dL (ref 13.0–18.0)
LYMPHS ABS: 2.3 10*3/uL (ref 1.0–3.6)
LYMPHS PCT: 27 %
MCH: 30.9 pg (ref 26.0–34.0)
MCHC: 34.7 g/dL (ref 32.0–36.0)
MCV: 89.2 fL (ref 80.0–100.0)
MONO ABS: 0.6 10*3/uL (ref 0.2–1.0)
MONOS PCT: 7 %
NEUTROS ABS: 5.5 10*3/uL (ref 1.4–6.5)
Neutrophils Relative %: 63 %
Platelets: 140 10*3/uL — ABNORMAL LOW (ref 150–440)
RBC: 4.81 MIL/uL (ref 4.40–5.90)
RDW: 13.9 % (ref 11.5–14.5)
WBC: 8.7 10*3/uL (ref 3.8–10.6)

## 2016-04-12 LAB — COMPREHENSIVE METABOLIC PANEL
ALBUMIN: 4.4 g/dL (ref 3.5–5.0)
ALK PHOS: 66 U/L (ref 38–126)
ALT: 19 U/L (ref 17–63)
ANION GAP: 8 (ref 5–15)
AST: 27 U/L (ref 15–41)
BUN: 21 mg/dL — ABNORMAL HIGH (ref 6–20)
CALCIUM: 9.1 mg/dL (ref 8.9–10.3)
CO2: 24 mmol/L (ref 22–32)
Chloride: 100 mmol/L — ABNORMAL LOW (ref 101–111)
Creatinine, Ser: 1.16 mg/dL (ref 0.61–1.24)
GFR calc non Af Amer: >60 mL/min (ref >60)
GLUCOSE: 102 mg/dL — AB (ref 65–99)
Potassium: 3.2 mmol/L — ABNORMAL LOW (ref 3.5–5.1)
Sodium: 132 mmol/L — ABNORMAL LOW (ref 135–145)
TOTAL PROTEIN: 8.6 g/dL — AB (ref 6.5–8.1)
Total Bilirubin: 0.7 mg/dL (ref 0.3–1.2)

## 2016-04-12 NOTE — Progress Notes (Signed)
Started on Norvasc on Monday due to hypertension. Pt reports that systolic bp was over 037. Pt reported headaches prior to bp management. No headaches since starting bp meds.  Last psa level normal per patient. - in Nov. 2017.  Pt c/o Intermittent Productive cough - yellow mucus. Pt reports intermittent shortness of breath. Although, he is able to walk up the flight of stairs in cancer center w/o dyspnea and stopping half-way between to catch his breath.  Intermittent episodes of constipation.

## 2016-04-12 NOTE — Progress Notes (Signed)
[place] NOTE  Patient Care Team: Kathrine Haddock, NP as PCP - General (Nurse Practitioner)  CHIEF COMPLAINTS/PURPOSE OF CONSULTATION: Lung mass; highly concerning for cancer.   #    No history exists.     HISTORY OF PRESENTING ILLNESS:  Marcus Cortez 71 y.o.  male a previous history of smoking in fairly good health otherwise noted to have progressive sinus congestion over the last many months. He was initially evaluated by ENT- also had a CT of the sinuses that was negative. Patient also noted to have progressive shortness of breath over the last many months. Patient was also treated with antibiotics without any significant improvement. Since patient's symptoms did not improve he got a CT scan of the chest that showed left upper lobe mass about 4-6 cm in size; bilateral hilar/precarinal lymph nodes. He went on to have a PET scan - that again showed left upper lobe mass;but also left lower lobe infiltrative changes around the pleura. Patient has been evaluated by pulmonary- biopsy/washings negative.  Patient denies any fevers. Denies any night sweats. Denies any nausea vomiting. Denies any headaches. He is noted to have elevated blood pressure last few weeks currently on antihypertensive. Improving. Denies any pain. No weight loss. No tingling or numbness.  ROS: A complete 10 point review of system is done which is negative except mentioned above in history of present illness  MEDICAL HISTORY:  Past Medical History:  Diagnosis Date  . Allergy   . 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 Former smoker   . Heart murmur   . Hyperlipidemia   . IFG (impaired fasting glucose)   . Osteoporosis     SURGICAL HISTORY: Past Surgical History:  Procedure Laterality Date  . EYE SURGERY  2010   cataract  . 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 Previous history of smoking half a pack a day for 25-30 years. Quit approximately 2 years ago. Social History   Social History  . Marital status: Widowed    Spouse name: N/A  . Number of children: N/A  . Years of education: N/A   Occupational History  . Not on file.   Social History Main Topics  . Smoking status: Former Smoker    Packs/day: 0.50    Years: 25.00    Types: Cigarettes    Quit date: 01/10/2015  . Smokeless tobacco: Former Systems developer    Types: Chew    Quit date: 04/10/1978  . Alcohol use No  . Drug use: No  . Sexual activity: Yes   Other Topics Concern  . Not on file   Social History Narrative  . No narrative on file    FAMILY HISTORY: Family History  Problem Relation Age of Onset  . Hypertension Mother   . Thyroid disease Mother   . Glaucoma Mother   . Alzheimer's disease Father   . Stroke Maternal Grandmother   . Cancer Maternal Grandfather   . Alzheimer's disease Paternal Grandmother   . Stroke Paternal Grandfather     ALLERGIES:  has No Known Allergies.  MEDICATIONS:  Current Outpatient Prescriptions  Medication Sig Dispense Refill  . amLODipine (NORVASC) 5 MG tablet Take 1 tablet (5 mg total) by mouth daily. 30 tablet 1  . aspirin 81 MG tablet Take 81  mg by mouth daily.    . naproxen sodium (ANAPROX) 220 MG tablet Take 220 mg by mouth daily as needed (pain).    . Probiotic CAPS Take 1 capsule by mouth daily.    Marland Kitchen acetaminophen (TYLENOL) 325 MG tablet Take 650 mg by mouth every 6 (six) hours as needed.    . fluticasone (FLONASE) 50 MCG/ACT nasal spray Place 2 sprays into both nostrils daily as needed for allergies or rhinitis.      No current facility-administered medications for this visit.       Marland Kitchen  PHYSICAL EXAMINATION: ECOG PERFORMANCE STATUS: 0 - Asymptomatic  Vitals:   04/12/16 1345  BP: (!) 160/89  Pulse: 72  Resp: 20  Temp: (!) 96.2 F (35.7 C)    Filed Weights   04/12/16 1345  Weight: 204 lb (92.5 kg)    GENERAL: Well-nourished well-developed; Alert, no distress and comfortable.   With family.  EYES: no pallor or icterus OROPHARYNX: no thrush or ulceration; good dentition  NECK: supple, no masses felt LYMPH:  no palpable lymphadenopathy in the cervical, axillary or inguinal regions LUNGS: clear to auscultation and  No wheeze or crackles HEART/CVS: regular rate & rhythm and no murmurs; No lower extremity edema ABDOMEN: abdomen soft, non-tender and normal bowel sounds Musculoskeletal:no cyanosis of digits and no clubbing  PSYCH: alert & oriented x 3 with fluent speech NEURO: no focal motor/sensory deficits SKIN:  no rashes or significant lesions  LABORATORY DATA:  I have reviewed the data as listed Lab Results  Component Value Date   WBC 8.7 04/12/2016   HGB 14.9 04/12/2016   HCT 42.9 04/12/2016   MCV 89.2 04/12/2016   PLT 140 (L) 04/12/2016    Recent Labs  10/25/15 0917 01/31/16 0836 04/02/16 1038 04/12/16 1415  NA 140 140  --  132*  K 4.3 4.1  --  3.2*  CL 99 101  --  100*  CO2 21 22  --  24  GLUCOSE 92 97  --  102*  BUN 13 12  --  21*  CREATININE 1.11 1.10 1.20 1.16  CALCIUM 9.5 9.1  --  9.1  GFRNONAA 66 67  --  >60  GFRAA 77 78  --  >60  PROT 7.6 7.8  --  8.6*  ALBUMIN 4.5 4.6  --  4.4  AST 21 22  --  27  ALT 18 18  --  19  ALKPHOS 62 71  --  66  BILITOT 0.9 1.0  --  0.7    RADIOGRAPHIC STUDIES: I have personally reviewed the radiological images as listed and agreed with the findings in the report. Dg Chest 2 View  Result Date: 03/21/2016 CLINICAL DATA:  Sinus infection for the past 4 weeks. Currently on antibiotics. Intermittent shortness of breath for the past year. Former smoker. EXAM: CHEST  2 VIEW COMPARISON:  None in PACs FINDINGS: The lungs are well-expanded. The interstitial markings are increased diffusely. There is confluent alveolar opacity in the left perihilar region posteriorly.  There is no pleural effusion. The heart is mildly enlarged. The pulmonary vascularity is normal. There is calcification in the wall of the aortic arch. The bony thorax exhibits no acute abnormality. IMPRESSION: Abnormal parenchymal density in the perihilar region on the left which likely lies posteriorly in the upper lobe. This may reflect pneumonia but malignancy is not excluded. Underlying diffuse interstitial prominence may reflect acute or chronic bronchitic change. Given that the patient has been on antibiotics for the past  4 weeks, chest CT scanning now is recommended to exclude underlying malignancy. These results will be called to the ordering clinician or representative by the Radiologist Assistant, and communication documented in the PACS or zVision Dashboard. Electronically Signed   By: David  Martinique M.D.   On: 03/21/2016 11:14   Ct Chest W Contrast  Result Date: 04/02/2016 CLINICAL DATA:  Shortness of breath, abnormal chest x-ray, former smoker, quit smoking 2 years ago EXAM: CT CHEST WITH CONTRAST TECHNIQUE: Multidetector CT imaging of the chest was performed during intravenous contrast administration. CONTRAST:  34m ISOVUE-300 IOPAMIDOL (ISOVUE-300) INJECTION 61% COMPARISON:  Chest x-ray 03/21/2016 FINDINGS: Cardiovascular: Borderline cardiomegaly. Extensive atherosclerotic calcifications of coronary arteries. Atherosclerotic calcifications of thoracic aorta. No aortic aneurysm. Mediastinum/Nodes: There is a precarinal lymph node Measures 1.4 cm short-axis. Right hilar lymph node Measures 1.3 cm short-axis. Left hilar lymph node Measures 1.3 cm. Findings are highly suspicious for pathologic adenopathy. Lungs/Pleura: As noted on chest x-ray there is dense consolidation in left upper lobe connecting connecting with left hilum suprahilar region. Measures at least 4.5 x 6 cm. Although there is some peripheral and hilar air bronchogram within lesion given abrupt cutoff of the segmental bronchus coronal  image 71 this is highly suspicious for malignancy such as bronchogenic carcinoma. Further correlation with bronchoscopy and biopsy is recommended. Less likely segmental pneumonia. There is small left upper lobe loculated pleural effusion. Additional infiltrate/consolidation noted left upper lobe posteriorly. Satellite tumor spread cannot be excluded. Axial image 58 there is 9 mm nodule in right upper lobe peripheral. Metastatic disease cannot be excluded. Upper Abdomen: The visualized upper abdomen shows bilateral thickening of adrenal glands without definite evidence of mass. Visualized liver, spleen and pancreas is unremarkable. Atherosclerotic calcifications of abdominal aorta. Musculoskeletal: Sagittal images of the spine shows mild degenerative changes thoracic spine. No destructive bony lesions are noted. IMPRESSION: 1. As noted on chest x-ray there is dense consolidation in left upper lobe connecting connecting with left hilum suprahilar region. Measures at least 4.5 x 6 cm. Although there is some peripheral and hilar air bronchogram within lesion given abrupt cutoff of the segmental bronchus coronal image 71 this is highly suspicious for malignancy such as bronchogenic carcinoma. Further correlation with bronchoscopy and biopsy is recommended. Less likely segmental pneumonia. 2. Small loculated left upper lobe pleural effusion with patchy infiltrate/consolidation posteriorly. Satellite tumor spread cannot be excluded. Further correlation with PET scan is recommended. 3. Bilateral emphysematous changes. Bilateral peripheral fibrotic changes. 4. Mild bilateral thickening of adrenal glands without evidence of adrenal gland mass. Extensive atherosclerotic calcifications of coronary arteries. Atherosclerotic calcifications of abdominal and thoracic aorta. 5. There is precarinal and bilateral hilar adenopathy. These results were called by telephone at the time of interpretation on 04/02/2016 at 11:17 am to [facility] with Dr. PClyde Canterbury, who verbally acknowledged these results. Electronically Signed   By: LLahoma CrockerM.D.   On: 04/02/2016 11:18   Nm Pet Image Initial (pi) Skull Base To Thigh  Result Date: 04/09/2016 CLINICAL DATA:  Initial treatment strategy for LEFT lung mass. EXAM: NUCLEAR MEDICINE PET SKULL BASE TO THIGH TECHNIQUE: 12.3 mCi F-18 FDG was injected intravenously. Full-ring PET imaging was performed from the skull base to thigh after the radiotracer. CT data was obtained and used for attenuation correction and anatomic localization. FASTING BLOOD GLUCOSE:  Value: 106 mg/dl COMPARISON:  CT 04/02/2016. FINDINGS: NECK No hypermetabolic lymph nodes in the neck. CHEST Hypermetabolic LEFT suprahilar mass measures 5.2 x 5.4 cm with SUV max  equal 10.2. Peripheral nodular thickening along the pleural surface of the LEFT lower lobe measures 2.3 x 1.3 cm with SUV max equal 4.5. No hypermetabolic mediastinal lymph nodes. Centrilobular emphysema and paraseptal emphysema in the upper lobes. ABDOMEN/PELVIS No abnormal metabolic activity adrenal glands. Liver is normal. No hypermetabolic abdominopelvic lymph nodes. Atherosclerotic calcification of the aorta. Several posterior bladder diverticulum noted. Prostate normal. SKELETON No focal hypermetabolic activity to suggest skeletal metastasis. IMPRESSION: 1. Hypermetabolic mass in the LEFT upper lobe is concerning bronchogenic carcinoma. 2. New hypermetabolic focus of consolidation in the LEFT lower lobe is favored atelectasis or infection. 3. No hypermetabolic mediastinal nodes. Electronically Signed   By: Suzy Bouchard M.D.   On: 04/09/2016 10:15    ASSESSMENT & PLAN:   Mass of upper lobe of left lung # Left upper lobe lung mass- 4-6 cm in size. Highly suspicious for malignancy. Bronchoscopy biopsy inconclusive. Some concerns for possible pneumonia. Clinical less likely given absence of fevers/leukocytosis/ status post antibiotics. Left a message for Dr.  Luana Shu from pathology- to discuss the bronchoscopy/lavage results. I would await further evaluation with Dr. Faith Rogue; we will need to have a repeat biopsy to rule out malignancy.  # Left lower lobe pleural/infiltrative changes- question etiology. Possible malignancy.  # Above plan of care was discussed with the patient and daughters in detail. Also spoke to Dr. Alva Garnet; and also Dr.Oaks. Patient to see Dr. Faith Rogue today.  # # I reviewed the blood work- with the patient in detail; also reviewed the imaging independently [as summarized above]; and with the patient in detail.   # Follow up will be decided based upon above workup/plan of care.   # will also review at tumor conference.   Thank you for allowing me to participate in the care of your pleasant patient. Please do not hesitate to contact me with questions or concerns in the interim.     All questions were answered. The patient knows to call the clinic with any problems, questions or concerns.       Cammie Sickle, MD 04/13/2016 11:25 AM

## 2016-04-12 NOTE — Assessment & Plan Note (Addendum)
#   Left upper lobe lung mass- 4-6 cm in size. Highly suspicious for malignancy. Bronchoscopy biopsy inconclusive. Some concerns for possible pneumonia. Clinical less likely given absence of fevers/leukocytosis/ status post antibiotics. Left a message for Dr. Luana Shu from pathology- to discuss the bronchoscopy/lavage results. I would await further evaluation with Dr. Faith Rogue; we will need to have a repeat biopsy to rule out malignancy.  # Left lower lobe pleural/infiltrative changes- question etiology. Possible malignancy.  # Above plan of care was discussed with the patient and daughters in detail. Also spoke to Dr. Alva Garnet; and also Dr.Oaks. Patient to see Dr. Faith Rogue today.  # # I reviewed the blood work- with the patient in detail; also reviewed the imaging independently [as summarized above]; and with the patient in detail.   # Follow up will be decided based upon above workup/plan of care.   # will also review at tumor conference.   Thank you for allowing me to participate in the care of your pleasant patient. Please do not hesitate to contact me with questions or concerns in the interim.

## 2016-04-12 NOTE — Telephone Encounter (Signed)
Per DS, pt needs to have a CT chest w/contrast with a f/u appt after in 6 weeks. Ct chest ordered.  Please order and schedule. Thanks

## 2016-04-13 LAB — CEA: CEA: 51.3 ng/mL — ABNORMAL HIGH (ref 0.0–4.7)

## 2016-04-15 ENCOUNTER — Telehealth: Payer: Self-pay | Admitting: Interventional Radiology

## 2016-04-15 ENCOUNTER — Ambulatory Visit (INDEPENDENT_AMBULATORY_CARE_PROVIDER_SITE_OTHER): Payer: Medicare HMO | Admitting: Cardiothoracic Surgery

## 2016-04-15 ENCOUNTER — Encounter: Payer: Self-pay | Admitting: Cardiothoracic Surgery

## 2016-04-15 ENCOUNTER — Telehealth: Payer: Self-pay

## 2016-04-15 VITALS — BP 179/91 | HR 79 | Temp 97.8°F | Ht 73.0 in | Wt 201.0 lb

## 2016-04-15 DIAGNOSIS — R0989 Other specified symptoms and signs involving the circulatory and respiratory systems: Secondary | ICD-10-CM | POA: Diagnosis not present

## 2016-04-15 DIAGNOSIS — R918 Other nonspecific abnormal finding of lung field: Secondary | ICD-10-CM

## 2016-04-15 LAB — CYTOLOGY - NON PAP

## 2016-04-15 NOTE — Telephone Encounter (Signed)
Reviewed imaging with Dr. Genevive Bi. Note that Bronch bx of LUL neg. I suspect sampling error given cutoff of bronchi and SUV=10 on PETCT. Lesion looks approachable for CT guided  percutaneous core of LUL process based on PET, at level of punctate calcification.  We could set this up as outpt with moderate sedation in CT, as needed.

## 2016-04-15 NOTE — Telephone Encounter (Signed)
Called patient's daughter Marcus Cortez) to let her know that her dad's U/S Carotid was scheduled on 04/22/2016 at 8:30 AM at the [facility]. I will call her as soon as I have the results.

## 2016-04-15 NOTE — Patient Instructions (Addendum)
I will call and schedule your carotid ultrasound. Once it's scheduled I will call you.  I will fax your order for a CT Guided Lung Biopsy. Central Schedule will be contacting you with the date and time.  Please stop taking your Naproxen starting tomorrow. If you have any pain, you are able to take Tylenol.  Dr. Alva Garnet will be calling you today to let you know if he will put you on antibiotics or not. If you don't receive his call today, please call his office tomorrow.

## 2016-04-15 NOTE — Telephone Encounter (Signed)
I sent a CT Guided Lung Biopsy form to [facility]. The radiologist will review it and then she will call the patient.

## 2016-04-15 NOTE — Progress Notes (Signed)
Patient ID: Marcus Cortez, male   DOB: 12-24-1945, 71 y.o.   MRN: [id number]  Chief Complaint  Patient presents with  . Other    Lung Mass    Referred By Dr. Rogue Bussing Reason for Referral left upper lobe mass  HPI Location, Quality, Duration, Severity, Timing, Context, Modifying Factors, Associated Signs and Symptoms.  Marcus Cortez is a 71 y.o. male.  This patient is a 71 year old gentleman with a half pack per day smoking history for about 30 years. He quit 2 years ago. About a year and a half ago he began experiencing increasing shortness of breath has been progressive. He believes it began with chopping wood outside last winter. This was associated with some nasal drainage and cough. He's had no weight loss. His cough has been nonproductive. He became more short of breath over the last several months and presented to his primary care physician who made a subsequent referral to our ENT physicians. A dedicated CT scan of the sinuses did not reveal any sinusitis and he was then referred for chest x-ray. The chest x-ray was abnormal showing a left suprahilar mass. This was followed by a CT scan and PET scan. In addition the patient has been seen by Dr. Gwendolyn Lima in pulmonary medicine as well as Dr. Rogue Bussing in oncology. The patient underwent bronchoscopy which was negative for malignancy. The patient was seen today at the request of Dr. Rogue Bussing for his left upper lobe mass. The patient states that he does get short of breath with exertion. He is not short of breath at rest. He's had no weight loss. He's had no hemoptysis.   Past Medical History:  Diagnosis Date  . Allergy   . Cancer ([place]) 03/2016   lung  . Carotid artery bruit   . Emphysema lung ([place])   . Emphysema of lung ([place])   . Former smoker   . Heart murmur   . Hyperlipidemia   . IFG (impaired fasting glucose)   . Osteoporosis   . Pulmonary stenosis     Past Surgical History:  Procedure Laterality Date  . EYE SURGERY   2010   cataract  . FLEXIBLE BRONCHOSCOPY N/A 04/10/2016   Procedure: FLEXIBLE BRONCHOSCOPY;  Surgeon: Wilhelmina Mcardle, MD;  Location: [facility] ORS;  Service: Pulmonary;  Laterality: N/A;    Family History  Problem Relation Age of Onset  . Hypertension Mother   . Thyroid disease Mother   . Glaucoma Mother   . Alzheimer's disease Father   . Stroke Maternal Grandmother   . Cancer Maternal Grandfather   . Alzheimer's disease Paternal Grandmother   . Stroke Paternal Grandfather     Social History Social History  Substance Use Topics  . Smoking status: Former Smoker    Packs/day: 0.50    Years: 35.00    Types: Cigarettes    Quit date: 01/10/2015  . Smokeless tobacco: Former Systems developer    Types: Chew    Quit date: 04/10/1978  . Alcohol use No    No Known Allergies  Current Outpatient Prescriptions  Medication Sig Dispense Refill  . acetaminophen (TYLENOL) 325 MG tablet Take 650 mg by mouth every 6 (six) hours as needed.    Marland Kitchen amLODipine (NORVASC) 5 MG tablet Take 1 tablet (5 mg total) by mouth daily. 30 tablet 1  . aspirin 81 MG tablet Take 81 mg by mouth daily.    . fluticasone (FLONASE) 50 MCG/ACT nasal spray Place 2 sprays into both nostrils daily as needed  for allergies or rhinitis.     . naproxen sodium (ANAPROX) 220 MG tablet Take 220 mg by mouth daily as needed (pain).    . Probiotic CAPS Take 1 capsule by mouth daily.     No current facility-administered medications for this visit.       Review of Systems A complete review of systems was asked and was negative except for the following positive findingsShortness of breath, sinus issues, cough, headache, joint pain.  Blood pressure (!) 179/91, pulse 79, temperature 97.8 F (36.6 C), temperature source Oral, height _0  (1.854 m), weight 201 lb (91.2 kg), SpO2 94 %.  Physical Exam CONSTITUTIONAL:  Pleasant, well-developed, well-nourished, and in no acute distress. EYES: Pupils equal and reactive to light, Sclera  non-icteric EARS, NOSE, MOUTH AND THROAT:  The oropharynx was clear.  Dentition is good repair.  Oral mucosa pink and moist. LYMPH NODES:  Lymph nodes in the neck and axillae were normal RESPIRATORY:  Lungs were clear with the exception of the right base of which there were some Velcro sounding rales.  Normal respiratory effort without pathologic use of accessory muscles of respiration CARDIOVASCULAR: Heart was regular without murmurs.  There were no carotid bruits on the left but a loud carotid bruit on the right GI: The abdomen was soft, nontender, and nondistended. There were no palpable masses. There was no hepatosplenomegaly. There were normal bowel sounds in all quadrants. GU:  Rectal deferred.   MUSCULOSKELETAL:  Normal muscle strength and tone.  No clubbing or cyanosis.   SKIN:  There were no pathologic skin lesions.  There were no nodules on palpation. NEUROLOGIC:  Sensation is normal.  Cranial nerves are grossly intact. PSYCH:  Oriented to person, place and time.  Mood and affect are normal.  Data Reviewed CT scan and PET scan  I have personally reviewed the patient's imaging, laboratory findings and medical records.    Assessment    I have discussed this patient's care in great detail with Dr. Jamal Collin in pulmonary medicine and Dr. Rogue Bussing and oncology. It is unclear whether or not the left upper lobe mass represents a pneumonic inflammatory or infection process or represents a true malignancy. I had a long discussion today with the patient regarding the options. He would like pursue a CT-guided needle biopsy. I reviewed with our radiologist the findings. They are agreeable that this can be approached with a percutaneous technique.    Plan    We have set the patient up for a CT-guided needle biopsy. We've also arranged for carotid ultrasound. Once these are complete we can then make more definitive recommendations.       Nestor Lewandowsky, MD 04/15/2016, 4:24 PM

## 2016-04-16 ENCOUNTER — Telehealth: Payer: Self-pay | Admitting: Pulmonary Disease

## 2016-04-16 ENCOUNTER — Telehealth: Payer: Self-pay | Admitting: Unknown Physician Specialty

## 2016-04-16 MED ORDER — HYDROCHLOROTHIAZIDE 25 MG PO TABS: 25.0000 mg | ORAL_TABLET | Freq: Every day | ORAL | 3 refills | Status: DC

## 2016-04-16 NOTE — Telephone Encounter (Signed)
Let's add HCTZ to present meds.  I wrote a script

## 2016-04-16 NOTE — Telephone Encounter (Signed)
Called and let Marcus Cortez, patient's daughter, know that Malachy Mood added a new medication to present medications.

## 2016-04-16 NOTE — Telephone Encounter (Signed)
Routing to provider  

## 2016-04-16 NOTE — Telephone Encounter (Signed)
Pt daughter calling stating Dr Genevive Bi told patient we were to call in some antibiotics  To see if the spot they found in patient would go away   Please advise

## 2016-04-16 NOTE — Telephone Encounter (Signed)
Ms Marcus Cortez called and stated that the pt had been having a high BP even though he had be taking amLODipine (NORVASC) 5 MG tablet ( on average 185/95) and she would like to know if there is anything else the pt could try to bring the BP down.

## 2016-04-16 NOTE — Telephone Encounter (Signed)
Per MA, Dr. Alva Garnet to contact patient first before I call to schedule. DS to be in office on Friday 04/19/16. Rhonda J Cobb

## 2016-04-17 ENCOUNTER — Telehealth: Payer: Self-pay

## 2016-04-17 NOTE — Telephone Encounter (Signed)
Dr. Genevive Bi signed the order.

## 2016-04-17 NOTE — Telephone Encounter (Signed)
Manuela Schwartz from radiology needs you to E-sign the ultrasound order or fax to [phone]. [facility] scheduling phone number is [phone].

## 2016-04-17 NOTE — Telephone Encounter (Signed)
Barbara from Specialty Scheduling called me back letting me know that patient's CT Guided Biopsy is scheduled to be on Tuesday 04/23/2016 at 10:30 AM at the [facility].   I then called patient and had to leave him a detail message with his appointment information.

## 2016-04-19 NOTE — Telephone Encounter (Signed)
CT Chest with contrast scheduled for Thurs 05/23/16 at 10:30 at the [facility] location. Liquids only 4 hours before. Pt to arrive at 10:15.  ROV scheduled for Monday 05/27/16 at 2:30 pm with Dr. Alva Garnet.   Contacted patient about these appointments, pt stated to mail appointments to him that his daughter kept up with his appointments.  Appointments mailed to patients home address. Rhonda J Cobb

## 2016-04-22 ENCOUNTER — Ambulatory Visit
Admission: RE | Admit: 2016-04-22 | Discharge: 2016-04-22 | Disposition: A | Discharge status: Home / Self Care | Payer: Medicare HMO | Source: Ambulatory Visit | Attending: Cardiothoracic Surgery | Admitting: Cardiothoracic Surgery

## 2016-04-22 DIAGNOSIS — R0989 Other specified symptoms and signs involving the circulatory and respiratory systems: Secondary | ICD-10-CM | POA: Insufficient documentation

## 2016-04-22 DIAGNOSIS — I6523 Occlusion and stenosis of bilateral carotid arteries: Secondary | ICD-10-CM | POA: Insufficient documentation

## 2016-04-22 DIAGNOSIS — I6522 Occlusion and stenosis of left carotid artery: Secondary | ICD-10-CM | POA: Diagnosis not present

## 2016-04-22 NOTE — Telephone Encounter (Signed)
Please advise on message below. Message was not routed when taken on 04/16/16. Thanks

## 2016-04-23 ENCOUNTER — Ambulatory Visit
Admission: RE | Admit: 2016-04-23 | Discharge: 2016-04-23 | Disposition: A | Discharge status: Home / Self Care | Payer: Medicare HMO | Source: Ambulatory Visit | Attending: Interventional Radiology | Admitting: Interventional Radiology

## 2016-04-23 ENCOUNTER — Ambulatory Visit
Admission: RE | Admit: 2016-04-23 | Discharge: 2016-04-23 | Disposition: A | Discharge status: Home / Self Care | Payer: Medicare HMO | Source: Ambulatory Visit | Attending: Cardiothoracic Surgery | Admitting: Cardiothoracic Surgery

## 2016-04-23 DIAGNOSIS — R918 Other nonspecific abnormal finding of lung field: Secondary | ICD-10-CM | POA: Diagnosis present

## 2016-04-23 DIAGNOSIS — I700 Atherosclerosis of aorta: Secondary | ICD-10-CM | POA: Diagnosis not present

## 2016-04-23 DIAGNOSIS — R911 Solitary pulmonary nodule: Secondary | ICD-10-CM | POA: Diagnosis not present

## 2016-04-23 DIAGNOSIS — C3412 Malignant neoplasm of upper lobe, left bronchus or lung: Secondary | ICD-10-CM | POA: Insufficient documentation

## 2016-04-23 DIAGNOSIS — Z9889 Other specified postprocedural states: Secondary | ICD-10-CM

## 2016-04-23 LAB — PROTIME-INR
INR: 1
Prothrombin Time: 13.2 seconds (ref 11.4–15.2)

## 2016-04-23 LAB — APTT: APTT: 27 s (ref 24–36)

## 2016-04-23 LAB — CBC
HCT: 44.9 % (ref 40.0–52.0)
Hemoglobin: 15.7 g/dL (ref 13.0–18.0)
MCH: 30.6 pg (ref 26.0–34.0)
MCHC: 35 g/dL (ref 32.0–36.0)
MCV: 87.7 fL (ref 80.0–100.0)
PLATELETS: 134 10*3/uL — AB (ref 150–440)
RBC: 5.12 MIL/uL (ref 4.40–5.90)
RDW: 13.7 % (ref 11.5–14.5)
WBC: 9.2 10*3/uL (ref 3.8–10.6)

## 2016-04-23 MED ORDER — MIDAZOLAM HCL 5 MG/5ML IJ SOLN
INTRAMUSCULAR | Status: AC | PRN
  Administered 2016-04-23 (×2): 1 mg via INTRAVENOUS

## 2016-04-23 MED ORDER — MIDAZOLAM HCL 2 MG/2ML IJ SOLN
INTRAMUSCULAR | Status: AC | PRN
  Administered 2016-04-23: 1 mg via INTRAVENOUS

## 2016-04-23 MED ORDER — FENTANYL CITRATE (PF) 100 MCG/2ML IJ SOLN
INTRAMUSCULAR | Status: AC | PRN
  Administered 2016-04-23 (×2): 50 ug via INTRAVENOUS

## 2016-04-23 MED ORDER — SODIUM CHLORIDE 0.9 % IV SOLN
INTRAVENOUS | Status: DC
  Administered 2016-04-23: 11:00:00 via INTRAVENOUS

## 2016-04-23 NOTE — Procedures (Signed)
Interventional Radiology Procedure Note  Procedure: CT guided biopsy of LUL PET + mass Complications: No immediate Recommendations: - Bedrest until CXR cleared.  Minimize talking, coughing or otherwise straining.  - Follow up 2 hr CXR pending    Signed,  Criselda Peaches, MD

## 2016-04-23 NOTE — Discharge Instructions (Signed)
Needle Biopsy of Lung, Care After Refer to this sheet in the next few weeks. These instructions provide you with information on caring for yourself after your procedure. Your health care provider may also give you more specific instructions. Your treatment has been planned according to current medical practices, but problems sometimes occur. Call your health care provider if you have any problems or questions after your procedure. WHAT TO EXPECT AFTER THE PROCEDURE  A bandage will be applied over the area where the needle was inserted. You may be asked to apply pressure to the bandage for several minutes to ensure there is minimal bleeding.  In most cases, you can leave when your needle biopsy procedure is completed. Do not drive yourself home. Someone else should take you home.  If you received an IV sedative or general anesthetic, you will be taken to a comfortable place to relax while the medicine wears off.  If you have upcoming travel scheduled, talk to your health care provider about when it is safe to travel by air after the procedure. HOME CARE INSTRUCTIONS  Expect to take it easy for the rest of the day.  Protect the area where you received the needle biopsy by keeping the bandage in place for as long as instructed.  You may feel some mild pain or discomfort in the area, but this should stop in a day or two.  Take medicines only as directed by your health care provider. SEEK MEDICAL CARE IF:   You have pain at the biopsy site that worsens or is not helped by medicine.  You have swelling or drainage at the needle biopsy site.  You have a fever. SEEK IMMEDIATE MEDICAL CARE IF:   You have new or worsening shortness of breath.  You have chest pain.  You are coughing up blood.  You have bleeding that does not stop with pressure or a bandage.  You develop light-headedness or fainting. This information is not intended to replace advice given to you by your health care  provider. Make sure you discuss any questions you have with your health care provider. Document Released: 12/23/2006 Document Revised: 03/18/2014 Document Reviewed: 07/20/2012 Elsevier Interactive Patient Education  2017 [employer].

## 2016-04-24 ENCOUNTER — Telehealth: Payer: Self-pay

## 2016-04-24 ENCOUNTER — Other Ambulatory Visit: Payer: Self-pay | Admitting: Internal Medicine

## 2016-04-24 ENCOUNTER — Other Ambulatory Visit: Payer: Self-pay | Admitting: Anatomic Pathology & Clinical Pathology

## 2016-04-24 ENCOUNTER — Other Ambulatory Visit: Payer: Self-pay | Admitting: *Deleted

## 2016-04-24 DIAGNOSIS — C3412 Malignant neoplasm of upper lobe, left bronchus or lung: Secondary | ICD-10-CM

## 2016-04-24 DIAGNOSIS — R519 Headache, unspecified: Secondary | ICD-10-CM

## 2016-04-24 DIAGNOSIS — R51 Headache: Secondary | ICD-10-CM

## 2016-04-24 NOTE — Progress Notes (Signed)
msg sent to scheduling. 

## 2016-04-24 NOTE — Telephone Encounter (Signed)
Called Marcus Cortez back to let her know that Dr. Genevive Bi wanted her father to be seen by Dr. Rica Koyanagi for his Thoracic aortic atherosclerosis. I told Marcus Cortez that as soon as his appointment was scheduled, that I would call her with the information. Marcus Cortez had no further questions.

## 2016-04-24 NOTE — Telephone Encounter (Signed)
Appointment has been made for 05/09/16 @ 8:30 with Dr Schnier--[address] [place], [place] [address]. Claiborne Billings was notified.

## 2016-04-24 NOTE — Telephone Encounter (Signed)
Patient's daughter Claiborne Billings) called stating that she was told to call us today. However, I was not sure why she needed to call us. Therefore, I told her that I needed to get in contact with Dr. Genevive Bi and ask him and then I would call her back. Claiborne Billings understood and had no further questions.

## 2016-04-25 ENCOUNTER — Telehealth: Payer: Self-pay | Admitting: *Deleted

## 2016-04-25 NOTE — Telephone Encounter (Signed)
Contacted patient. reviewed that md would like to order an mri of brain. Test is scheduled on 2/20 @ 11:30 with 11am arrival. He gave verbal understanding of the plan. I explained that when he comes tomorrow for his md visit, we will give him directions about the test and a print out of his appointment time.

## 2016-04-25 NOTE — Progress Notes (Signed)
Pt informed

## 2016-04-26 ENCOUNTER — Inpatient Hospital Stay (HOSPITAL_BASED_OUTPATIENT_CLINIC_OR_DEPARTMENT_OTHER): Payer: Medicare HMO | Admitting: Internal Medicine

## 2016-04-26 VITALS — BP 153/86 | HR 85 | Temp 97.9°F | Wt 196.1 lb

## 2016-04-26 DIAGNOSIS — J329 Chronic sinusitis, unspecified: Secondary | ICD-10-CM | POA: Diagnosis not present

## 2016-04-26 DIAGNOSIS — I700 Atherosclerosis of aorta: Secondary | ICD-10-CM | POA: Diagnosis not present

## 2016-04-26 DIAGNOSIS — C3412 Malignant neoplasm of upper lobe, left bronchus or lung: Secondary | ICD-10-CM | POA: Diagnosis not present

## 2016-04-26 DIAGNOSIS — R51 Headache: Secondary | ICD-10-CM | POA: Diagnosis not present

## 2016-04-26 DIAGNOSIS — R918 Other nonspecific abnormal finding of lung field: Secondary | ICD-10-CM | POA: Diagnosis not present

## 2016-04-26 DIAGNOSIS — Z7982 Long term (current) use of aspirin: Secondary | ICD-10-CM

## 2016-04-26 DIAGNOSIS — J90 Pleural effusion, not elsewhere classified: Secondary | ICD-10-CM | POA: Diagnosis not present

## 2016-04-26 DIAGNOSIS — I6523 Occlusion and stenosis of bilateral carotid arteries: Secondary | ICD-10-CM

## 2016-04-26 DIAGNOSIS — E279 Disorder of adrenal gland, unspecified: Secondary | ICD-10-CM | POA: Diagnosis not present

## 2016-04-26 DIAGNOSIS — I10 Essential (primary) hypertension: Secondary | ICD-10-CM

## 2016-04-26 DIAGNOSIS — Z809 Family history of malignant neoplasm, unspecified: Secondary | ICD-10-CM

## 2016-04-26 DIAGNOSIS — E785 Hyperlipidemia, unspecified: Secondary | ICD-10-CM

## 2016-04-26 DIAGNOSIS — R0602 Shortness of breath: Secondary | ICD-10-CM | POA: Diagnosis not present

## 2016-04-26 DIAGNOSIS — D696 Thrombocytopenia, unspecified: Secondary | ICD-10-CM

## 2016-04-26 DIAGNOSIS — Z79899 Other long term (current) drug therapy: Secondary | ICD-10-CM

## 2016-04-26 DIAGNOSIS — Z87891 Personal history of nicotine dependence: Secondary | ICD-10-CM

## 2016-04-26 NOTE — Progress Notes (Signed)
START ON PATHWAY REGIMEN - Non-Small Cell Lung  [id number]: Carboplatin AUC=2 + Paclitaxel 45 mg/m2 Weekly During Radiation    Administer weekly:     Paclitaxel (Taxol(R)) 45 mg/m2 in 250 mL NS IV over 1 hour followed by       Dose Mod: None     Carboplatin (Paraplatin(R)) AUC=2 in 100 mL NS IV over 30 minutes       Dose Mod: None         Additional Orders: * All AUC calculations intended to be used in [employer] formula  **Always confirm dose/schedule in your pharmacy ordering system**    Patient Characteristics: Stage IIA/IIB - Unresectable AJCC T Category: T3 Current Disease Status: No Distant Mets or Local Recurrence AJCC N Category: N0 AJCC M Category: M0 AJCC 8 Stage Grouping: IIB  Intent of Therapy: Curative Intent, Discussed with Patient

## 2016-04-26 NOTE — Assessment & Plan Note (Signed)
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 Check PDL 1; also check- EGFR; ALK; ROS-1 mutation status.   # Patient likely not a surgical candidate; awaiting second opinion wth Dr.Gerhardt in [place].  # If patient is deemed not a surgical candidate - I recommend treatment option of concurrent chemoradiation. Recommend referral to Dr. Donella Stade. Patient had multiple questions regarding chemotherapy protective side effects. Discussed at length. Discussed that with definitive chemoradiation- the chance of cure is about 20% or so.   # If patient does progress on first line therapy-  options include immunotherapy and targeted drugs. Discussed the potential mechanism of action of immunotherapy; potential side effects.  # Bilateral carotid artery stenosis right more than left- awaiting evaluation with vascular surgery next week.  # Patient follow-up with me approximately 10 days or so/ to review the next plan of treatment.  # Patient had been very concerned about the side effects of chemoradiation [his wife had died of ovarian cancer/ had poor tolerance to treatments]. Patient is very concerned that his quality of life would be impacted with chemotherapy/radiation. Also discussed that with out treatments life expectancy is in the order of many months.   # 40 minutes face-to-face with the patient discussing the above plan of care; more than 50% of time spent on prognosis/ natural history; counseling and coordination.

## 2016-04-26 NOTE — Progress Notes (Signed)
Patient here today for folow up.  Patient c/o headache today

## 2016-04-26 NOTE — Progress Notes (Signed)
[facility] NOTE  Patient Care Team: Kathrine Haddock, NP as PCP - General (Nurse Practitioner)  CHIEF COMPLAINTS/PURPOSE OF CONSULTATION: Lung mass; highly concerning for cancer.   #   Oncology History   # FEB 2018- [place] CA LUL [CT Bx] T3N0 [abutting mediastinum; Mediastinal LN on CT; Neg on PET]  # MILD THROMBOCYTOPENIA [130-140s]  # Bil Carotid stenosis [R>90%; L~50%]     Primary cancer of left upper lobe of lung ([place])   04/24/2016 Initial Diagnosis    Primary cancer of left upper lobe of lung ([facility])       HISTORY OF PRESENTING ILLNESS:  Marcus Cortez 71 y.o.  male for recently diagnosed left upper lobe mass is currently status post CT-guided biopsy is here for follow-up. He continues to deny any unusual shortness of breath or chest pain. Denies any hemoptysis.  Patient denies any fevers. Denies any night sweats. Denies any nausea vomiting. Intermittent dizzy spells. Mild headaches. Denies any pain. No weight loss. No tingling or numbness.  ROS: A complete 10 point review of system is done which is negative except mentioned above in history of present illness  MEDICAL HISTORY:  Past Medical History:  Diagnosis Date  . Allergy   . Cancer ([place]) 03/2016   lung  . Carotid artery bruit   . Emphysema lung ([place])   . Emphysema of lung ([place])   . Former smoker   . Heart murmur   . Hyperlipidemia   . IFG (impaired fasting glucose)   . Osteoporosis   . Pulmonary stenosis     SURGICAL HISTORY: Past Surgical History:  Procedure Laterality Date  . EYE SURGERY  2010   cataract  . 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 Previous history of smoking half a pack a day for 25-30 years. Quit approximately 2 years ago. Social History   Social History  .  Marital status: Widowed    Spouse name: N/A  . Number of children: N/A  . Years of education: N/A   Occupational History  . Not on file.   Social History Main Topics  . Smoking status: Former Smoker    Packs/day: 0.50    Years: 35.00    Types: Cigarettes    Quit date: 01/10/2015  . Smokeless tobacco: Former Systems developer    Types: Chew    Quit date: 04/10/1978  . Alcohol use No  . Drug use: No  . Sexual activity: Yes   Other Topics Concern  . Not on file   Social History Narrative  . No narrative on file    FAMILY HISTORY: Family History  Problem Relation Age of Onset  . Hypertension Mother   . Thyroid disease Mother   . Glaucoma Mother   . Alzheimer's disease Father   . Stroke Maternal Grandmother   . Cancer Maternal Grandfather   . Alzheimer's disease Paternal Grandmother   . Stroke Paternal Grandfather     ALLERGIES:  has No Known Allergies.  MEDICATIONS:  Current Outpatient Prescriptions  Medication Sig Dispense Refill  . acetaminophen (TYLENOL) 325 MG tablet Take 650 mg by mouth every 6 (six) hours as needed.    Marland Kitchen amLODipine (NORVASC) 5 MG tablet Take 1 tablet (5 mg total) by mouth daily. 30 tablet 1  . aspirin 81 MG tablet  Take 81 mg by mouth daily.    . fluticasone (FLONASE) 50 MCG/ACT nasal spray Place 2 sprays into both nostrils daily as needed for allergies or rhinitis.     . hydrochlorothiazide (HYDRODIURIL) 25 MG tablet Take 1 tablet (25 mg total) by mouth daily. 90 tablet 3  . naproxen sodium (ANAPROX) 220 MG tablet Take 220 mg by mouth daily as needed (pain).    . Probiotic CAPS Take 1 capsule by mouth daily.     No current facility-administered medications for this visit.       Marland Kitchen  PHYSICAL EXAMINATION: ECOG PERFORMANCE STATUS: 0 - Asymptomatic  Vitals:   04/26/16 0833  BP: (!) 153/86  Pulse: 85  Temp: 97.9 F (36.6 C)   Filed Weights   04/26/16 0833  Weight: 196 lb 2 oz (89 kg)    GENERAL: Well-nourished well-developed; Alert, no distress  and comfortable.   With family.  EYES: no pallor or icterus OROPHARYNX: no thrush or ulceration; good dentition  NECK: supple, no masses felt LYMPH:  no palpable lymphadenopathy in the cervical, axillary or inguinal regions LUNGS: clear to auscultation and  No wheeze or crackles HEART/CVS: regular rate & rhythm and no murmurs; No lower extremity edema ABDOMEN: abdomen soft, non-tender and normal bowel sounds Musculoskeletal:no cyanosis of digits and no clubbing  PSYCH: alert & oriented x 3 with fluent speech NEURO: no focal motor/sensory deficits SKIN:  no rashes or significant lesions  LABORATORY DATA:  I have reviewed the data as listed Lab Results  Component Value Date   WBC 9.2 04/23/2016   HGB 15.7 04/23/2016   HCT 44.9 04/23/2016   MCV 87.7 04/23/2016   PLT 134 (L) 04/23/2016    Recent Labs  10/25/15 0917 01/31/16 0836 04/02/16 1038 04/12/16 1415  NA 140 140  --  132*  K 4.3 4.1  --  3.2*  CL 99 101  --  100*  CO2 21 22  --  24  GLUCOSE 92 97  --  102*  BUN 13 12  --  21*  CREATININE 1.11 1.10 1.20 1.16  CALCIUM 9.5 9.1  --  9.1  GFRNONAA 66 67  --  >60  GFRAA 77 78  --  >60  PROT 7.6 7.8  --  8.6*  ALBUMIN 4.5 4.6  --  4.4  AST 21 22  --  27  ALT 18 18  --  19  ALKPHOS 62 71  --  66  BILITOT 0.9 1.0  --  0.7    RADIOGRAPHIC STUDIES: I have personally reviewed the radiological images as listed and agreed with the findings in the report. Ct Chest W Contrast  Result Date: 04/02/2016 CLINICAL DATA:  Shortness of breath, abnormal chest x-ray, former smoker, quit smoking 2 years ago EXAM: CT CHEST WITH CONTRAST TECHNIQUE: Multidetector CT imaging of the chest was performed during intravenous contrast administration. CONTRAST:  68m ISOVUE-300 IOPAMIDOL (ISOVUE-300) INJECTION 61% COMPARISON:  Chest x-ray 03/21/2016 FINDINGS: Cardiovascular: Borderline cardiomegaly. Extensive atherosclerotic calcifications of coronary arteries. Atherosclerotic calcifications of  thoracic aorta. No aortic aneurysm. Mediastinum/Nodes: There is a precarinal lymph node Measures 1.4 cm short-axis. Right hilar lymph node Measures 1.3 cm short-axis. Left hilar lymph node Measures 1.3 cm. Findings are highly suspicious for pathologic adenopathy. Lungs/Pleura: As noted on chest x-ray there is dense consolidation in left upper lobe connecting connecting with left hilum suprahilar region. Measures at least 4.5 x 6 cm. Although there is some peripheral and hilar air bronchogram within lesion given  abrupt cutoff of the segmental bronchus coronal image 71 this is highly suspicious for malignancy such as bronchogenic carcinoma. Further correlation with bronchoscopy and biopsy is recommended. Less likely segmental pneumonia. There is small left upper lobe loculated pleural effusion. Additional infiltrate/consolidation noted left upper lobe posteriorly. Satellite tumor spread cannot be excluded. Axial image 58 there is 9 mm nodule in right upper lobe peripheral. Metastatic disease cannot be excluded. Upper Abdomen: The visualized upper abdomen shows bilateral thickening of adrenal glands without definite evidence of mass. Visualized liver, spleen and pancreas is unremarkable. Atherosclerotic calcifications of abdominal aorta. Musculoskeletal: Sagittal images of the spine shows mild degenerative changes thoracic spine. No destructive bony lesions are noted. IMPRESSION: 1. As noted on chest x-ray there is dense consolidation in left upper lobe connecting connecting with left hilum suprahilar region. Measures at least 4.5 x 6 cm. Although there is some peripheral and hilar air bronchogram within lesion given abrupt cutoff of the segmental bronchus coronal image 71 this is highly suspicious for malignancy such as bronchogenic carcinoma. Further correlation with bronchoscopy and biopsy is recommended. Less likely segmental pneumonia. 2. Small loculated left upper lobe pleural effusion with patchy  infiltrate/consolidation posteriorly. Satellite tumor spread cannot be excluded. Further correlation with PET scan is recommended. 3. Bilateral emphysematous changes. Bilateral peripheral fibrotic changes. 4. Mild bilateral thickening of adrenal glands without evidence of adrenal gland mass. Extensive atherosclerotic calcifications of coronary arteries. Atherosclerotic calcifications of abdominal and thoracic aorta. 5. There is precarinal and bilateral hilar adenopathy. These results were called by telephone at the time of interpretation on 04/02/2016 at 11:17 am to [facility] working with Dr. Clyde Canterbury , who verbally acknowledged these results. Electronically Signed   By: Lahoma Crocker M.D.   On: 04/02/2016 11:18   US Carotid Bilateral  Result Date: 04/22/2016 CLINICAL DATA:  Carotid bruits. EXAM: BILATERAL CAROTID DUPLEX ULTRASOUND TECHNIQUE: Pearline Cables scale imaging, color Doppler and duplex ultrasound were performed of bilateral carotid and vertebral arteries in the neck. COMPARISON:  PET-CT 04/09/2016. FINDINGS: Criteria: Quantification of carotid stenosis is based on velocity parameters that correlate the residual internal carotid diameter with NASCET-based stenosis levels, using the diameter of the distal internal carotid lumen as the denominator for stenosis measurement. The following velocity measurements were obtained: RIGHT ICA: 67/15 cm/sec peak systolic flow however the carotid bulb is 309 centimeters/second. CCA:  49/20 cm/sec SYSTOLIC ICA/CCA RATIO:  1.3 DIASTOLIC ICA/CCA RATIO:  1.3 ECA:  123 cm/sec LEFT ICA:  93/19 cm/sec CCA:  10/07 cm/sec SYSTOLIC ICA/CCA RATIO:  1.2 DIASTOLIC ICA/CCA RATIO:  1.0 ECA:  152 cm/sec RIGHT CAROTID ARTERY: Prominent right carotid bifurcation atherosclerotic vascular plaque with probable high-grade stenosis possibly greater than 90% noted. Plaque may be ulcerated. RIGHT VERTEBRAL ARTERY:  Patent with antegrade flow . LEFT CAROTID ARTERY: Moderate left carotid bifurcation  atherosclerotic vascular disease. Degree of stenosis less than 50%. Plaque at the left carotid bifurcation may also be ulcerated. LEFT VERTEBRAL ARTERY:  Patent with antegrade flow. IMPRESSION: 1. Prominent right carotid bifurcation atherosclerotic vascular plaque with probable high-grade stenosis, possibly greater than 90% noted. Atherosclerotic vascular plaque may be ulcerated. 2. Moderate left carotid bifurcation atherosclerotic vascular plaque. Degree of stenosis less than 50%. Left carotid bifurcation plaque may also be ulcerated. 3. Vertebral arteries are patent antegrade flow. Electronically Signed   By: Marcello Moores  Register   On: 04/22/2016 10:38   Nm Pet Image Initial (pi) Skull Base To Thigh  Result Date: 04/09/2016 CLINICAL DATA:  Initial treatment strategy for LEFT lung mass.  EXAM: NUCLEAR MEDICINE PET SKULL BASE TO THIGH TECHNIQUE: 12.3 mCi F-18 FDG was injected intravenously. Full-ring PET imaging was performed from the skull base to thigh after the radiotracer. CT data was obtained and used for attenuation correction and anatomic localization. FASTING BLOOD GLUCOSE:  Value: 106 mg/dl COMPARISON:  CT 04/02/2016. FINDINGS: NECK No hypermetabolic lymph nodes in the neck. CHEST Hypermetabolic LEFT suprahilar mass measures 5.2 x 5.4 cm with SUV max equal 10.2. Peripheral nodular thickening along the pleural surface of the LEFT lower lobe measures 2.3 x 1.3 cm with SUV max equal 4.5. No hypermetabolic mediastinal lymph nodes. Centrilobular emphysema and paraseptal emphysema in the upper lobes. ABDOMEN/PELVIS No abnormal metabolic activity adrenal glands. Liver is normal. No hypermetabolic abdominopelvic lymph nodes. Atherosclerotic calcification of the aorta. Several posterior bladder diverticulum noted. Prostate normal. SKELETON No focal hypermetabolic activity to suggest skeletal metastasis. IMPRESSION: 1. Hypermetabolic mass in the LEFT upper lobe is concerning bronchogenic carcinoma. 2. New  hypermetabolic focus of consolidation in the LEFT lower lobe is favored atelectasis or infection. 3. No hypermetabolic mediastinal nodes. Electronically Signed   By: Suzy Bouchard M.D.   On: 04/09/2016 10:15   Ct Biopsy  Result Date: 04/23/2016 INDICATION: 71 year old male with a left upper lobe mass which is hypermetabolic on PET-CT. Recent endobronchial biopsy was negative for malignancy which is discordant with the imaging findings. He presents for CT-guided biopsy. EXAM: CT-guided biopsy left upper lobe pulmonary nodule Interventional Radiologist:  Criselda Peaches, MD MEDICATIONS: None. ANESTHESIA/SEDATION: Fentanyl 3 mcg IV; Versed 100 mg IV Moderate Sedation Time:  21 minutes The patient was continuously monitored during the procedure by the interventional radiology nurse under my direct supervision. FLUOROSCOPY TIME:  Fluoroscopy Time: 0 minutes 0 seconds (0 mGy). COMPLICATIONS: None immediate. Estimated blood loss:  0 PROCEDURE: Informed written consent was obtained from the patient after a thorough discussion of the procedural risks, benefits and alternatives. All questions were addressed. Maximal Sterile Barrier Technique was utilized including caps, mask, sterile gowns, sterile gloves, sterile drape, hand hygiene and skin antiseptic. A timeout was performed prior to the initiation of the procedure. A planning axial CT scan was performed. The nodule in the left upper lobe was successfully identified. A suitable skin entry site was selected and marked. The region was then sterilely prepped and draped in standard fashion using Betadine skin prep. Local anesthesia was attained by infiltration with 1% lidocaine. A small dermatotomy was made. Under intermittent CT fluoroscopic guidance, a 17 gauge trocar needle was advanced into the lung and positioned at the margin of the nodule. Multiple 18 gauge core biopsies were then coaxially obtained using the BioPince automated biopsy device. Biopsy specimens  were placed in formalin and delivered to pathology for further analysis. The BioSentry device was inserted and deployed. Post biopsy axial CT imaging demonstrates no evidence of immediate complication. There is no pneumothorax. Mild perilesional alveolar hemorrhage is not unexpected. The patient tolerated the procedure well. IMPRESSION: Technically successful CT-guided biopsy left upper lobe pulmonary nodule. Electronically Signed   By: Jacqulynn Cadet M.D.   On: 04/23/2016 13:09   Dg Chest Port 1 View  Result Date: 04/23/2016 CLINICAL DATA:  Status post lung biopsy today EXAM: PORTABLE CHEST 1 VIEW COMPARISON:  CT scan of the chest of today's date and chest x-ray of January 11th 2018. FINDINGS: There is persistent soft tissue density mass in the left perihilar region. No postprocedure pneumothorax is observed. There is no significant pleural effusion. The right lung is adequately inflated. The  cardiac silhouette is mildly enlarged. The pulmonary vascularity is normal. There is calcification in the wall of the aortic arch. The observed bony thorax exhibits no acute abnormality. IMPRESSION: No postprocedure pneumothorax or hemothorax following left-sided lung biopsy. Thoracic aortic atherosclerosis. Electronically Signed   By: David  Martinique M.D.   On: 04/23/2016 14:54    ASSESSMENT & PLAN:   Primary cancer of left upper lobe of lung ([place]) # Adenocarcinoma LUL- T3N0 [however non-PET avid mediastinal LN; LLL infiltrative changes]. Check PDL 1; also check- EGFR; ALK; ROS-1 mutation status.   # Patient likely not a surgical candidate; awaiting second opinion wth Dr.Gerhardt in [place].  # If patient is deemed not a surgical candidate - I recommend treatment option of concurrent chemoradiation. Recommend referral to Dr. Donella Stade. Patient had multiple questions regarding chemotherapy protective side effects. Discussed at length. Discussed that with definitive chemoradiation- the chance of cure is about 20%  or so.   # If patient does progress on first line therapy-  options include immunotherapy and targeted drugs. Discussed the potential mechanism of action of immunotherapy; potential side effects.  # Bilateral carotid artery stenosis right more than left- awaiting evaluation with vascular surgery next week.  # Patient follow-up with me approximately 10 days or so/ to review the next plan of treatment.  # Patient had been very concerned about the side effects of chemoradiation [his wife had died of ovarian cancer/ had poor tolerance to treatments]. Patient is very concerned that his quality of life would be impacted with chemotherapy/radiation. Also discussed that with out treatments life expectancy is in the order of many months.   # 40 minutes face-to-face with the patient discussing the above plan of care; more than 50% of time spent on prognosis/ natural history; counseling and coordination.      Cammie Sickle, MD 04/26/2016 5:36 PM

## 2016-04-29 ENCOUNTER — Encounter: Payer: Self-pay | Admitting: Thoracic Surgery (Cardiothoracic Vascular Surgery)

## 2016-04-29 ENCOUNTER — Institutional Professional Consult (permissible substitution) (INDEPENDENT_AMBULATORY_CARE_PROVIDER_SITE_OTHER): Payer: Medicare HMO | Admitting: Thoracic Surgery (Cardiothoracic Vascular Surgery)

## 2016-04-29 VITALS — BP 159/96 | HR 80 | Resp 20 | Ht 73.0 in | Wt 196.0 lb

## 2016-04-29 DIAGNOSIS — C3492 Malignant neoplasm of unspecified part of left bronchus or lung: Secondary | ICD-10-CM

## 2016-04-29 NOTE — Progress Notes (Signed)
PCP is Marcus Haddock, NP Referring Provider is Marcus Lewandowsky, MD  Chief Complaint  Patient presents with  . Lung Mass    Surgical eval,CT-guided biopsy 04/23/16,PET Scan 04/09/16, Chest CT 04/02/16, MR Brain sch'ed for 04/30/16    HPI: 71 year old man sent for consultation regarding a left upper lobe mass.  Marcus Cortez is a 71 year old man with a history of tobacco abuse, COPD, newly diagnosed pulmonary fibrosis, and newly diagnosed carotid artery disease. He says that that dating back to November 2016, he's had some shortness of breath with exertion. He first noted it while chopping wood. He really didn't pay too much attention to it until a few months ago when it was gradually worsening. He can still walk a mile on level ground and consents to walk up a flight of stairs. He also was having problems with cough and congestion. He thought it might be a sinus issue and so the ENT. He was treated with empiric antibiotics, but did not improve. Because sinuses were normal a chest x-ray was done which showed a left upper lobe opacity. CT of the chest showed a large left upper lobe mass with some mildly enlarged mediastinal nodes. PET CT was done which showed a left upper lobe mass was markedly hypermetabolic. The mediastinal nodes were not hypermetabolic. There was some activity posteriorly in the left lower lobe and also a small loculated pleural effusion. Bronchoscopy was nondiagnostic, but CT guided biopsy showed adenocarcinoma.  He smoked about half pack a day for 35 years. He quit in November 2016. He's lost about 12-14 pounds over the last 3 months. He does complain of or appetite and decreased energy. He is not having any chest pain. He has a productive cough but no hemoptysis. He has complained of some headaches. He says this is associated with hypertension which is relatively new onset. It tends to be across his forehead.  Zubrod Score: At the time of surgery this patient's most appropriate activity  status/level should be described as: _0     0    Normal activity, no symptoms _1     1    Restricted in physical strenuous activity but ambulatory, able to do out light work _2     2    Ambulatory and capable of self care, unable to do work activities, up and about >50 % of waking hours                              _3     3    Only limited self care, in bed greater than 50% of waking hours _4     4    Completely disabled, no self care, confined to bed or chair _5     5    Moribund   Past Medical History:  Diagnosis Date  . Allergy   . Cancer ([place]) 03/2016   lung  . Carotid artery bruit   . Emphysema lung ([place])   . Emphysema of lung ([place])   . Former smoker   . Heart murmur   . Hyperlipidemia   . IFG (impaired fasting glucose)   . Osteoporosis   . Pulmonary stenosis     Past Surgical History:  Procedure Laterality Date  . EYE SURGERY  2010   cataract  . FLEXIBLE BRONCHOSCOPY N/A 04/10/2016   Procedure: FLEXIBLE BRONCHOSCOPY;  Surgeon: Wilhelmina Mcardle, MD;  Location: [facility] ORS;  Service: Pulmonary;  Laterality: N/A;    Family History  Problem Relation Age of Onset  . Hypertension Mother   . Thyroid disease Mother   . Glaucoma Mother   . Alzheimer's disease Father   . Stroke Maternal Grandmother   . Cancer Maternal Grandfather   . Alzheimer's disease Paternal Grandmother   . Stroke Paternal Grandfather     Social History Social History  Substance Use Topics  . Smoking status: Former Smoker    Packs/day: 0.50    Years: 35.00    Types: Cigarettes    Quit date: 01/10/2015  . Smokeless tobacco: Former Systems developer    Types: Chew    Quit date: 04/10/1978  . Alcohol use No    Current Outpatient Prescriptions  Medication Sig Dispense Refill  . acetaminophen (TYLENOL) 325 MG tablet Take 650 mg by mouth every 6 (six) hours as needed.    Marland Kitchen amLODipine (NORVASC) 5 MG tablet Take 1 tablet (5 mg total) by mouth daily. 30 tablet 1  . aspirin 81 MG tablet Take 81 mg by mouth daily.    .  fluticasone (FLONASE) 50 MCG/ACT nasal spray Place 2 sprays into both nostrils daily as needed for allergies or rhinitis.     . hydrochlorothiazide (HYDRODIURIL) 25 MG tablet Take 1 tablet (25 mg total) by mouth daily. 90 tablet 3  . naproxen sodium (ANAPROX) 220 MG tablet Take 220 mg by mouth daily as needed (pain).    . Probiotic CAPS Take 1 capsule by mouth daily.     No current facility-administered medications for this visit.     No Known Allergies  Review of Systems  Constitutional: Positive for activity change, appetite change and unexpected weight change. Negative for chills and fever.  HENT: Negative for trouble swallowing and voice change.   Eyes: Negative for visual disturbance.  Respiratory: Positive for cough and shortness of breath. Negative for chest tightness and wheezing.   Cardiovascular: Negative for chest pain.  Gastrointestinal: Negative for abdominal pain and blood in stool.  Genitourinary: Negative for difficulty urinating and dysuria.  Musculoskeletal: Positive for arthralgias.  Neurological: Positive for headaches. Negative for weakness.  Hematological: Negative for adenopathy. Does not bruise/bleed easily.    BP (!) 159/96   Pulse 80   Resp 20   Ht _0  (1.854 m)   Wt 196 lb (88.9 kg)   SpO2 93% Comment: RA  BMI 25.86 kg/m  Physical Exam  Constitutional: He is oriented to person, place, and time. He appears well-developed and well-nourished. No distress.  HENT:  Head: Normocephalic and atraumatic.  Eyes: Conjunctivae and EOM are normal. No scleral icterus.  Neck: Neck supple. No thyromegaly present.  Cardiovascular: Normal rate, regular rhythm and intact distal pulses.   Murmur (2/6 systolic murmur) heard. Pulmonary/Chest: Effort normal and breath sounds normal. No respiratory distress. He has no wheezes.  Abdominal: Soft. He exhibits no distension. There is no tenderness.  Musculoskeletal: He exhibits no edema or deformity.  Lymphadenopathy:     He has no cervical adenopathy.  Neurological: He is alert and oriented to person, place, and time. No cranial nerve deficit.  Motor 5 out of 5 in all extremities  Skin: Skin is warm and dry.  Vitals reviewed.    Diagnostic Tests: CT CHEST WITH CONTRAST  TECHNIQUE: Multidetector CT imaging of the chest was performed during intravenous contrast administration.  CONTRAST:  83m ISOVUE-300 IOPAMIDOL (ISOVUE-300) INJECTION 61%  COMPARISON:  Chest x-ray 03/21/2016  FINDINGS: Cardiovascular: Borderline cardiomegaly. Extensive atherosclerotic calcifications of coronary arteries. Atherosclerotic calcifications of thoracic aorta. No aortic  aneurysm.  Mediastinum/Nodes: There is a precarinal lymph node Measures 1.4 cm short-axis. Right hilar lymph node Measures 1.3 cm short-axis. Left hilar lymph node Measures 1.3 cm. Findings are highly suspicious for pathologic adenopathy.  Lungs/Pleura: As noted on chest x-ray there is dense consolidation in left upper lobe connecting connecting with left hilum suprahilar region. Measures at least 4.5 x 6 cm. Although there is some peripheral and hilar air bronchogram within lesion given abrupt cutoff of the segmental bronchus coronal image 71 this is highly suspicious for malignancy such as bronchogenic carcinoma. Further correlation with bronchoscopy and biopsy is recommended. Less likely segmental pneumonia.  There is small left upper lobe loculated pleural effusion. Additional infiltrate/consolidation noted left upper lobe posteriorly. Satellite tumor spread cannot be excluded.  Axial image 58 there is 9 mm nodule in right upper lobe peripheral. Metastatic disease cannot be excluded.  Upper Abdomen: The visualized upper abdomen shows bilateral thickening of adrenal glands without definite evidence of mass. Visualized liver, spleen and pancreas is unremarkable. Atherosclerotic calcifications of abdominal aorta.  Musculoskeletal:  Sagittal images of the spine shows mild degenerative changes thoracic spine. No destructive bony lesions are noted.  IMPRESSION: 1. As noted on chest x-ray there is dense consolidation in left upper lobe connecting connecting with left hilum suprahilar region. Measures at least 4.5 x 6 cm. Although there is some peripheral and hilar air bronchogram within lesion given abrupt cutoff of the segmental bronchus coronal image 71 this is highly suspicious for malignancy such as bronchogenic carcinoma. Further correlation with bronchoscopy and biopsy is recommended. Less likely segmental pneumonia. 2. Small loculated left upper lobe pleural effusion with patchy infiltrate/consolidation posteriorly. Satellite tumor spread cannot be excluded. Further correlation with PET scan is recommended. 3. Bilateral emphysematous changes. Bilateral peripheral fibrotic changes. 4. Mild bilateral thickening of adrenal glands without evidence of adrenal gland mass. Extensive atherosclerotic calcifications of coronary arteries. Atherosclerotic calcifications of abdominal and thoracic aorta. 5. There is precarinal and bilateral hilar adenopathy.  These results were called by telephone at the time of interpretation on 04/02/2016 at 11:17 am to [facility] working with Dr. Clyde Canterbury , who verbally acknowledged these results.   Electronically Signed   By: Lahoma Crocker M.D.   On: 04/02/2016 11:18  NUCLEAR MEDICINE PET SKULL BASE TO THIGH  TECHNIQUE: 12.3 mCi F-18 FDG was injected intravenously. Full-ring PET imaging was performed from the skull base to thigh after the radiotracer. CT data was obtained and used for attenuation correction and anatomic localization.  FASTING BLOOD GLUCOSE:  Value: 106 mg/dl  COMPARISON:  CT 04/02/2016.  FINDINGS: NECK  No hypermetabolic lymph nodes in the neck.  CHEST  Hypermetabolic LEFT suprahilar mass measures 5.2 x 5.4 cm with SUV max equal  10.2.  Peripheral nodular thickening along the pleural surface of the LEFT lower lobe measures 2.3 x 1.3 cm with SUV max equal 4.5.  No hypermetabolic mediastinal lymph nodes.  Centrilobular emphysema and paraseptal emphysema in the upper lobes.  ABDOMEN/PELVIS  No abnormal metabolic activity adrenal glands. Liver is normal. No hypermetabolic abdominopelvic lymph nodes.  Atherosclerotic calcification of the aorta. Several posterior bladder diverticulum noted. Prostate normal.  SKELETON  No focal hypermetabolic activity to suggest skeletal metastasis.  IMPRESSION: 1. Hypermetabolic mass in the LEFT upper lobe is concerning bronchogenic carcinoma. 2. New hypermetabolic focus of consolidation in the LEFT lower lobe is favored atelectasis or infection. 3. No hypermetabolic mediastinal nodes.   Electronically Signed   By: Suzy Bouchard M.D.   On: 04/09/2016  10:15  I personally reviewed the CT and PET/CT and consur with the findings noted above  Pulmonary function testing FVC= 2.69 (57%) FEV1= 2.03 (64%) TLC= 3.01 (42%) DLCO= 13.3 (59%)  Impression: 71 yo former smoker with a large left upper lobe adenocarcinoma measuring 4.5 x 6 cm. The mass closely abuts and likely invades the mediastinum which would make it at least stage IIIA. There also is activity in the the posterior aspect of the left lower lobe and a small loculated effusion which are also suspicious for extension.The mass is in close proximity to the main PA and would require a pneumonectomy for resection(if even possible). I do not think he would tolerate a pneumonectomy with an acceptable QOL given his TLC of 42% of predicted.  I discussed these issues in detail with Marcus Cortez and his daughters. We reviewed the films and I pointed out my areas of concern.  Best option is chemo and radiation, although XRT can be problematic with pulmonary fibrosis as well.  Plan: He will have brain MR tomorrow He  has appointments scheduled with Rad Onc and Dr. Rogue Bussing in the near future  Melrose Nakayama, MD [facility] [phone]

## 2016-04-30 ENCOUNTER — Telehealth: Payer: Self-pay | Admitting: Unknown Physician Specialty

## 2016-04-30 ENCOUNTER — Ambulatory Visit
Admission: RE | Admit: 2016-04-30 | Discharge: 2016-04-30 | Disposition: A | Discharge status: Home / Self Care | Payer: Medicare HMO | Source: Ambulatory Visit | Attending: Internal Medicine | Admitting: Internal Medicine

## 2016-04-30 DIAGNOSIS — C3412 Malignant neoplasm of upper lobe, left bronchus or lung: Secondary | ICD-10-CM

## 2016-04-30 DIAGNOSIS — R51 Headache: Secondary | ICD-10-CM

## 2016-04-30 DIAGNOSIS — R519 Headache, unspecified: Secondary | ICD-10-CM

## 2016-04-30 LAB — PULMONARY FUNCTION TEST

## 2016-04-30 NOTE — Telephone Encounter (Signed)
Both at this same time is fine.  Let me know if that does not work

## 2016-04-30 NOTE — Telephone Encounter (Signed)
Called and let patient's daughter know what Cheryl's instructions are. Daughter asked if the patient should take both amlodipine tablets together or if he should take 1 in the morning and 1 in the evening.

## 2016-04-30 NOTE — Telephone Encounter (Signed)
Patients daughter called regarding patient's BP is still very high and she is concerned.  170/90.  She is wondering if Malachy Mood thinks he may need to do some changes to his BP meds.  She said he is scheduled for an appt Friday which she does not feel he will be able to make due to his recent diagnosis of lung cancer. He has several appts due to this.  She is hoping to either speak with CMA or Malachy Mood.  Thanks  [phone]  Claiborne Billings

## 2016-04-30 NOTE — Telephone Encounter (Signed)
Yes, increase the Amlodipine to 2 pills.    Thanks,  [employer]

## 2016-04-30 NOTE — Telephone Encounter (Signed)
Called and gave patient's daughter Cheryl's instructions. I asked for her to give Marcus Cortez a call in about a week to let Marcus Cortez know that her father's BP is doing with 2 amlodipine tablets.

## 2016-04-30 NOTE — Telephone Encounter (Signed)
Called and spoke to patient's daughter. She states that her father's BP is still running 170's over 90's. Marcus Cortez stated that sometimes it goes down to 150's over 90's. She stated that her father has an appointment with [place] on Friday but states he will not be able to make this appointment due to having over doctor visits for recently being diagnosed with cancer. Patient's daughter wants to know if the patient should try taking 2 amlodipine tablets or what they should do for his BP. I told patient's daughter that I would send the message to [facility] and then call her back. Patient's daughter stated that it was OK to leave a VM if she is not able to answer my call because she is taking her father for a MRI.

## 2016-05-01 ENCOUNTER — Ambulatory Visit
Admission: RE | Admit: 2016-05-01 | Discharge: 2016-05-01 | Disposition: A | Discharge status: Home / Self Care | Payer: Medicare HMO | Source: Ambulatory Visit | Attending: Radiation Oncology | Admitting: Radiation Oncology

## 2016-05-01 ENCOUNTER — Encounter: Payer: Self-pay | Admitting: Radiation Oncology

## 2016-05-01 VITALS — BP 155/86 | HR 82 | Temp 98.5°F | Wt 195.7 lb

## 2016-05-01 DIAGNOSIS — Z7982 Long term (current) use of aspirin: Secondary | ICD-10-CM | POA: Diagnosis not present

## 2016-05-01 DIAGNOSIS — M818 Other osteoporosis without current pathological fracture: Secondary | ICD-10-CM | POA: Insufficient documentation

## 2016-05-01 DIAGNOSIS — J439 Emphysema, unspecified: Secondary | ICD-10-CM | POA: Insufficient documentation

## 2016-05-01 DIAGNOSIS — Z809 Family history of malignant neoplasm, unspecified: Secondary | ICD-10-CM | POA: Insufficient documentation

## 2016-05-01 DIAGNOSIS — Z87891 Personal history of nicotine dependence: Secondary | ICD-10-CM | POA: Insufficient documentation

## 2016-05-01 DIAGNOSIS — E785 Hyperlipidemia, unspecified: Secondary | ICD-10-CM | POA: Insufficient documentation

## 2016-05-01 DIAGNOSIS — C3412 Malignant neoplasm of upper lobe, left bronchus or lung: Secondary | ICD-10-CM | POA: Insufficient documentation

## 2016-05-01 DIAGNOSIS — R0602 Shortness of breath: Secondary | ICD-10-CM | POA: Diagnosis not present

## 2016-05-01 DIAGNOSIS — I2510 Atherosclerotic heart disease of native coronary artery without angina pectoris: Secondary | ICD-10-CM | POA: Insufficient documentation

## 2016-05-01 DIAGNOSIS — Z510 Encounter for antineoplastic radiation therapy: Secondary | ICD-10-CM | POA: Insufficient documentation

## 2016-05-01 DIAGNOSIS — R7301 Impaired fasting glucose: Secondary | ICD-10-CM | POA: Diagnosis not present

## 2016-05-01 DIAGNOSIS — Z79899 Other long term (current) drug therapy: Secondary | ICD-10-CM | POA: Insufficient documentation

## 2016-05-01 DIAGNOSIS — J8410 Pulmonary fibrosis, unspecified: Secondary | ICD-10-CM | POA: Diagnosis not present

## 2016-05-01 NOTE — Consult Note (Signed)
NEW PATIENT EVALUATION  Name: Marcus Cortez  MRN: [id number]  Date:   05/01/2016     DOB: 1945-06-21   This 71 y.o. 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  REFERRING PHYSICIAN: Kathrine Haddock, NP  CHIEF COMPLAINT:  Chief Complaint  Patient presents with  . Lung Cancer    Initial Evaluation    DIAGNOSIS: The encounter diagnosis was Primary cancer of left upper lobe of lung ([place]).   PREVIOUS INVESTIGATIONS:  PET CT and CT scans reviewed Pathology reports reviewed Clinical notes reviewed  HPI: Patient is a 71 year old male who presented with what he believed was a sinus infection and some increased dyspnea on exertion. He appeared was on antibiotic therapy did not improve was noted on chest x-ray to have a left upper lobe mass measuring at least 6 cm. This was highly suspicious for malignancy. PET CT scan demonstrated hypermetabolic activity in the left upper lobe mass no evidence of mediastinal adenopathy or hilar adenopathy. There is also some concomitant consolidation of mild hypermetabolic activity in the left lower lobe favoring atelectasis. Bronchoscopy was negative however a CT-guided biopsy was positive for adenocarcinoma with mixed features. Patient has been seen by medical oncology is now referred to radiation oncology for opinion. He's having no hemoptysis. He states he does get some dyspnea on exertion at this time. Patient is also dealing with prominent right carotid atherosclerotic disease with greater than 90% luminal stenosis. Patient has been seen by surgical opinion twice and they have declined surgical intervention on his chest. He is meeting for discussion of carotid surgery later this week.  PLANNED TREATMENT REGIMEN: Concurrent chemoradiation with curative intent  PAST MEDICAL HISTORY:  has a past medical history of Allergy; Cancer ([place]) (03/2016); Carotid artery bruit; Emphysema lung  ([place]); Emphysema of lung ([place]); Former smoker; Heart murmur; Hyperlipidemia; IFG (impaired fasting glucose); Osteoporosis; and Pulmonary stenosis.    PAST SURGICAL HISTORY:  Past Surgical History:  Procedure Laterality Date  . EYE SURGERY  2010   cataract  . FLEXIBLE BRONCHOSCOPY N/A 04/10/2016   Procedure: FLEXIBLE BRONCHOSCOPY;  Surgeon: Wilhelmina Mcardle, MD;  Location: [facility] ORS;  Service: Pulmonary;  Laterality: N/A;    FAMILY HISTORY: family history includes Alzheimer's disease in his father and paternal grandmother; Cancer in his maternal grandfather; Glaucoma in his mother; Hypertension in his mother; Stroke in his maternal grandmother and paternal grandfather; Thyroid disease in his mother.  SOCIAL HISTORY:  reports that he quit smoking about 15 months ago. His smoking use included Cigarettes. He has a 17.50 pack-year smoking history. He quit smokeless tobacco use about 38 years ago. His smokeless tobacco use included Chew. He reports that he does not drink alcohol or use drugs.  ALLERGIES: Patient has no known allergies.  MEDICATIONS:  Current Outpatient Prescriptions  Medication Sig Dispense Refill  . acetaminophen (TYLENOL) 325 MG tablet Take 650 mg by mouth every 6 (six) hours as needed.    Marland Kitchen amLODipine (NORVASC) 5 MG tablet Take 1 tablet (5 mg total) by mouth daily. 30 tablet 1  . aspirin 81 MG tablet Take 81 mg by mouth daily.    . fluticasone (FLONASE) 50 MCG/ACT nasal spray Place 2 sprays into both nostrils daily as needed for allergies or rhinitis.     . hydrochlorothiazide (HYDRODIURIL) 25 MG tablet Take 1 tablet (25 mg total) by mouth daily. 90 tablet 3  . naproxen sodium (ANAPROX) 220 MG  tablet Take 220 mg by mouth daily as needed (pain).    . Probiotic CAPS Take 1 capsule by mouth daily.     No current facility-administered medications for this encounter.     ECOG PERFORMANCE STATUS:  0 - Asymptomatic  REVIEW OF SYSTEMS: Except for mild dyspnea on exertion Patient  denies any weight loss, fatigue, weakness, fever, chills or night sweats. Patient denies any loss of vision, blurred vision. Patient denies any ringing  of the ears or hearing loss. No irregular heartbeat. Patient denies heart murmur or history of fainting. Patient denies any chest pain or pain radiating to her upper extremities. Patient denies any shortness of breath, difficulty breathing at night, cough or hemoptysis. Patient denies any swelling in the lower legs. Patient denies any nausea vomiting, vomiting of blood, or coffee ground material in the vomitus. Patient denies any stomach pain. Patient states has had normal bowel movements no significant constipation or diarrhea. Patient denies any dysuria, hematuria or significant nocturia. Patient denies any problems walking, swelling in the joints or loss of balance. Patient denies any skin changes, loss of hair or loss of weight. Patient denies any excessive worrying or anxiety or significant depression. Patient denies any problems with insomnia. Patient denies excessive thirst, polyuria, polydipsia. Patient denies any swollen glands, patient denies easy bruising or easy bleeding. Patient denies any recent infections, allergies or URI. Patient "s visual fields have not changed significantly in recent time.    PHYSICAL EXAM: BP (!) 155/86   Pulse 82   Temp 98.5 F (36.9 C)   Wt 195 lb 10.5 oz (88.7 kg)   BMI 25.81 kg/m  Well-developed well-nourished patient in NAD. HEENT reveals PERLA, EOMI, discs not visualized.  Oral cavity is clear. No oral mucosal lesions are identified. Neck is clear without evidence of cervical or supraclavicular adenopathy. Lungs are clear to A&P. Cardiac examination is essentially unremarkable with regular rate and rhythm without murmur rub or thrill. Abdomen is benign with no organomegaly or masses noted. Motor sensory and DTR levels are equal and symmetric in the upper and lower extremities. Cranial nerves II through XII are  grossly intact. Proprioception is intact. No peripheral adenopathy or edema is identified. No motor or sensory levels are noted. Crude visual fields are within normal range.  LABORATORY DATA: Pathology reports reviewed    RADIOLOGY RESULTS: CT scans and PET CT scans all reviewed and compatible with the above-stated findings   IMPRESSION: Stage IIIa adenocarcinoma the left upper lobe in 71 year old male  PLAN: At this time I have recommended concurrent chemoradiation with curative intent. Based on the size of the lesion his history of pulmonary fibrosis and overall poor pulmonary functions would recommend I MRT treatment planning and delivery to spare critical structures such as a spinal cord heart and normal lung volume. I would go to a slightly higher dose of 6600 cGy with curative intent in this situation. Patient is overall excellent general condition. Risks and benefits of treatment including cough fatigue alteration of blood counts skin reaction possible radiation esophagitis and damage to normal lung all were discussed in detail with the patient and his daughter.There will be extra effort by both professional staff as well as technical staff to coordinate and manage concurrent chemoradiation and ensuing side effects during his treatments. I have personally set up and ordered CT simulation for early next week. We'll coordinate his chemotherapy with medical oncology.  I would like to take this opportunity to thank you for allowing me to participate  in the care of your patient.Armstead Peaks., MD

## 2016-05-02 ENCOUNTER — Encounter: Payer: Medicare HMO | Admitting: Thoracic Surgery (Cardiothoracic Vascular Surgery)

## 2016-05-03 ENCOUNTER — Ambulatory Visit: Payer: Medicare Other | Admitting: Unknown Physician Specialty

## 2016-05-03 ENCOUNTER — Encounter: Payer: Self-pay | Admitting: Internal Medicine

## 2016-05-03 ENCOUNTER — Ambulatory Visit
Admission: RE | Admit: 2016-05-03 | Discharge: 2016-05-03 | Disposition: A | Discharge status: Home / Self Care | Payer: Medicare HMO | Source: Ambulatory Visit | Attending: Internal Medicine | Admitting: Internal Medicine

## 2016-05-03 DIAGNOSIS — C3412 Malignant neoplasm of upper lobe, left bronchus or lung: Secondary | ICD-10-CM | POA: Diagnosis not present

## 2016-05-03 DIAGNOSIS — C3490 Malignant neoplasm of unspecified part of unspecified bronchus or lung: Secondary | ICD-10-CM | POA: Diagnosis not present

## 2016-05-03 DIAGNOSIS — R51 Headache: Secondary | ICD-10-CM | POA: Insufficient documentation

## 2016-05-03 MED ORDER — GADOBENATE DIMEGLUMINE 529 MG/ML IV SOLN
20.0000 mL | Freq: Once | INTRAVENOUS | Status: AC | PRN
  Administered 2016-05-03: 18 mL via INTRAVENOUS

## 2016-05-06 ENCOUNTER — Inpatient Hospital Stay: Payer: Medicare HMO

## 2016-05-06 ENCOUNTER — Encounter: Payer: Self-pay | Admitting: Internal Medicine

## 2016-05-06 ENCOUNTER — Inpatient Hospital Stay (HOSPITAL_BASED_OUTPATIENT_CLINIC_OR_DEPARTMENT_OTHER): Payer: Medicare HMO | Admitting: Internal Medicine

## 2016-05-06 ENCOUNTER — Ambulatory Visit
Admission: RE | Admit: 2016-05-06 | Discharge: 2016-05-06 | Disposition: A | Discharge status: Home / Self Care | Payer: Medicare HMO | Source: Ambulatory Visit | Attending: Radiation Oncology | Admitting: Radiation Oncology

## 2016-05-06 ENCOUNTER — Other Ambulatory Visit: Payer: Self-pay | Admitting: *Deleted

## 2016-05-06 VITALS — BP 133/77 | HR 77 | Temp 97.5°F | Wt 197.1 lb

## 2016-05-06 DIAGNOSIS — I700 Atherosclerosis of aorta: Secondary | ICD-10-CM | POA: Diagnosis not present

## 2016-05-06 DIAGNOSIS — C3412 Malignant neoplasm of upper lobe, left bronchus or lung: Secondary | ICD-10-CM

## 2016-05-06 DIAGNOSIS — R918 Other nonspecific abnormal finding of lung field: Secondary | ICD-10-CM

## 2016-05-06 DIAGNOSIS — J90 Pleural effusion, not elsewhere classified: Secondary | ICD-10-CM

## 2016-05-06 DIAGNOSIS — I10 Essential (primary) hypertension: Secondary | ICD-10-CM

## 2016-05-06 DIAGNOSIS — R51 Headache: Secondary | ICD-10-CM | POA: Diagnosis not present

## 2016-05-06 DIAGNOSIS — J8410 Pulmonary fibrosis, unspecified: Secondary | ICD-10-CM | POA: Diagnosis not present

## 2016-05-06 DIAGNOSIS — E279 Disorder of adrenal gland, unspecified: Secondary | ICD-10-CM

## 2016-05-06 DIAGNOSIS — Z7982 Long term (current) use of aspirin: Secondary | ICD-10-CM

## 2016-05-06 DIAGNOSIS — M810 Age-related osteoporosis without current pathological fracture: Secondary | ICD-10-CM

## 2016-05-06 DIAGNOSIS — J439 Emphysema, unspecified: Secondary | ICD-10-CM | POA: Diagnosis not present

## 2016-05-06 DIAGNOSIS — R0602 Shortness of breath: Secondary | ICD-10-CM

## 2016-05-06 DIAGNOSIS — I6523 Occlusion and stenosis of bilateral carotid arteries: Secondary | ICD-10-CM

## 2016-05-06 DIAGNOSIS — Z87891 Personal history of nicotine dependence: Secondary | ICD-10-CM

## 2016-05-06 DIAGNOSIS — D696 Thrombocytopenia, unspecified: Secondary | ICD-10-CM

## 2016-05-06 DIAGNOSIS — Z77090 Contact with and (suspected) exposure to asbestos: Secondary | ICD-10-CM

## 2016-05-06 DIAGNOSIS — M818 Other osteoporosis without current pathological fracture: Secondary | ICD-10-CM | POA: Diagnosis not present

## 2016-05-06 DIAGNOSIS — E785 Hyperlipidemia, unspecified: Secondary | ICD-10-CM | POA: Diagnosis not present

## 2016-05-06 DIAGNOSIS — Z510 Encounter for antineoplastic radiation therapy: Secondary | ICD-10-CM | POA: Diagnosis not present

## 2016-05-06 DIAGNOSIS — Z79899 Other long term (current) drug therapy: Secondary | ICD-10-CM

## 2016-05-06 DIAGNOSIS — E876 Hypokalemia: Secondary | ICD-10-CM

## 2016-05-06 DIAGNOSIS — R7301 Impaired fasting glucose: Secondary | ICD-10-CM | POA: Diagnosis not present

## 2016-05-06 DIAGNOSIS — Z809 Family history of malignant neoplasm, unspecified: Secondary | ICD-10-CM

## 2016-05-06 DIAGNOSIS — J329 Chronic sinusitis, unspecified: Secondary | ICD-10-CM

## 2016-05-06 DIAGNOSIS — I2510 Atherosclerotic heart disease of native coronary artery without angina pectoris: Secondary | ICD-10-CM | POA: Diagnosis not present

## 2016-05-06 LAB — CBC WITH DIFFERENTIAL/PLATELET
BASOS ABS: 0.1 10*3/uL (ref 0–0.1)
Basophils Relative: 1 %
Eosinophils Absolute: 0.1 10*3/uL (ref 0–0.7)
Eosinophils Relative: 2 %
HEMATOCRIT: 41.9 % (ref 40.0–52.0)
Hemoglobin: 14.9 g/dL (ref 13.0–18.0)
LYMPHS PCT: 28 %
Lymphs Abs: 2.3 10*3/uL (ref 1.0–3.6)
MCH: 31.3 pg (ref 26.0–34.0)
MCHC: 35.6 g/dL (ref 32.0–36.0)
MCV: 87.8 fL (ref 80.0–100.0)
MONO ABS: 0.8 10*3/uL (ref 0.2–1.0)
MONOS PCT: 10 %
NEUTROS ABS: 4.9 10*3/uL (ref 1.4–6.5)
Neutrophils Relative %: 59 %
Platelets: 149 10*3/uL — ABNORMAL LOW (ref 150–440)
RBC: 4.77 MIL/uL (ref 4.40–5.90)
RDW: 13.7 % (ref 11.5–14.5)
WBC: 8.1 10*3/uL (ref 3.8–10.6)

## 2016-05-06 LAB — COMPREHENSIVE METABOLIC PANEL
ALK PHOS: 70 U/L (ref 38–126)
ALT: 41 U/L (ref 17–63)
AST: 37 U/L (ref 15–41)
Albumin: 4.7 g/dL (ref 3.5–5.0)
Anion gap: 12 (ref 5–15)
BILIRUBIN TOTAL: 1 mg/dL (ref 0.3–1.2)
BUN: 23 mg/dL — AB (ref 6–20)
CALCIUM: 9.4 mg/dL (ref 8.9–10.3)
CO2: 28 mmol/L (ref 22–32)
CREATININE: 1.27 mg/dL — AB (ref 0.61–1.24)
Chloride: 93 mmol/L — ABNORMAL LOW (ref 101–111)
GFR calc Af Amer: >60 mL/min (ref >60)
GFR, EST NON AFRICAN AMERICAN: 55 mL/min — AB (ref >60)
Glucose, Bld: 94 mg/dL (ref 65–99)
POTASSIUM: 2.9 mmol/L — AB (ref 3.5–5.1)
Sodium: 133 mmol/L — ABNORMAL LOW (ref 135–145)
TOTAL PROTEIN: 8.8 g/dL — AB (ref 6.5–8.1)

## 2016-05-06 MED ORDER — POTASSIUM CHLORIDE CRYS ER 20 MEQ PO TBCR: 20.0000 meq | EXTENDED_RELEASE_TABLET | Freq: Two times a day (BID) | ORAL | 0 refills | Status: DC

## 2016-05-06 NOTE — Progress Notes (Signed)
Recently started on diuretics - potassium 2.9 today. V/o to send K-dur 20 meq 1 tablet twice daily x 14 days supply to [facility].  Notified daughter Claiborne Billings- explained that potassium levels were 2.9 today. Explained that most likely r/t to diuretic use. Also explained Pt will need to be have labs rechecked at next visit to ensure potassium levels have improved. Educated daughter to have pt take potassium with a full glass of water to avoid medication induced gastritis. daughter gave verbal understanding and stated that she will have her husband go to pt's pharmacy to pick up potassium rx today.

## 2016-05-06 NOTE — Progress Notes (Signed)
[place] NOTE  Patient Care Team: Marcus Haddock, NP as PCP - General (Nurse Practitioner)  CHIEF COMPLAINTS/PURPOSE OF CONSULTATION: Lung mass; highly concerning for cancer.   #   Oncology History   # FEB 2018- [place] CA LUL [CT Bx] T3N0 [abutting mediastinum; Mediastinal LN on CT; Neg on PET]; NOT surgical candidate [Dr.Oaks/ Dr.Henderickson; [place]];   # Carbo-Taxol RT [[facility]]  # MILD THROMBOCYTOPENIA [130-140s]; Feb 2018- MR brain-NEG  # Bil Carotid stenosis [R>90%; L~50%]; Dr.Schneir  # MOL Testing- pending.      Primary cancer of left upper lobe of lung ([place])   04/24/2016 Initial Diagnosis    Primary cancer of left upper lobe of lung ([facility])       HISTORY OF PRESENTING ILLNESS:  Marcus Cortez 71 y.o.  male for recently diagnosed left upper lobe Adenocarcinoma status post CT-guided biopsy is here for follow-up.  In the interim patient was evaluated at [facility] by thoracic surgery- deemed not a surgical candidate given his tumor close to the mediastinum; and also the fact he is a critical carotid artery stenosis.   He continues to deny any unusual shortness of breath or chest pain. Denies any hemoptysis.  Patient denies any fevers. Denies any night sweats. Denies any nausea vomiting. Denies any pain. No weight loss. No tingling or numbness.  ROS: A complete 10 point review of system is done which is negative except mentioned above in history of present illness  MEDICAL HISTORY:  Past Medical History:  Diagnosis Date  . Allergy   . Cancer ([place]) 03/2016   lung  . Carotid artery bruit   . Emphysema lung ([place])   . Emphysema of lung ([place])   . Former smoker   . Heart murmur   . Hyperlipidemia   . IFG (impaired fasting glucose)   . Osteoporosis   . Pulmonary stenosis     SURGICAL HISTORY: Past Surgical History:  Procedure Laterality Date  . EYE SURGERY  2010   cataract  . 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 Previous history of smoking half a pack a day for 25-30 years. Quit approximately 2 years ago. Social History   Social History  . Marital status: Widowed    Spouse name: N/A  . Number of children: N/A  . Years of education: N/A   Occupational History  . Not on file.   Social History Main Topics  . Smoking status: Former Smoker    Packs/day: 0.50    Years: 35.00    Types: Cigarettes    Quit date: 01/10/2015  . Smokeless tobacco: Former Systems developer    Types: Chew    Quit date: 04/10/1978  . Alcohol use No  . Drug use: No  . Sexual activity: Yes   Other Topics Concern  . Not on file   Social History Narrative  . No narrative on file    FAMILY HISTORY: Family History  Problem Relation Age of Onset  . Hypertension Mother   . Thyroid disease Mother   . Glaucoma Mother   . Alzheimer's disease Father   . Stroke Maternal Grandmother   . Cancer Maternal Grandfather   . Alzheimer's disease Paternal Grandmother   . Stroke Paternal Grandfather     ALLERGIES:  has No Known Allergies.  MEDICATIONS:  Current Outpatient Prescriptions  Medication Sig Dispense Refill  . acetaminophen (TYLENOL) 325 MG tablet Take 650 mg by mouth every 6 (six) hours as needed.    Marland Kitchen amLODipine (NORVASC) 5 MG tablet Take 1 tablet (5 mg total) by mouth daily. (Patient taking differently: Take 10 mg by mouth daily. ) 30 tablet 1  . aspirin 81 MG tablet Take 81 mg by mouth daily.    . fluticasone (FLONASE) 50 MCG/ACT nasal spray Place 2 sprays into both nostrils daily as needed for allergies or rhinitis.     . hydrochlorothiazide (HYDRODIURIL) 25 MG tablet Take 1 tablet (25 mg total) by mouth daily. 90 tablet 3  . naproxen sodium (ANAPROX) 220 MG tablet Take 220 mg by mouth daily as needed (pain).    . Probiotic CAPS Take 1  capsule by mouth daily.     No current facility-administered medications for this visit.       Marland Kitchen  PHYSICAL EXAMINATION: ECOG PERFORMANCE STATUS: 0 - Asymptomatic  Vitals:   05/06/16 1117  BP: 133/77  Pulse: 77  Temp: 97.5 F (36.4 C)   Filed Weights   05/06/16 1117  Weight: 197 lb 2 oz (89.4 kg)    GENERAL: Well-nourished well-developed; Alert, no distress and comfortable.   With His daughter. EYES: no pallor or icterus OROPHARYNX: no thrush or ulceration; good dentition  NECK: supple, no masses felt LYMPH:  no palpable lymphadenopathy in the cervical, axillary or inguinal regions LUNGS: clear to auscultation and  No wheeze or crackles HEART/CVS: regular rate & rhythm and no murmurs; No lower extremity edema ABDOMEN: abdomen soft, non-tender and normal bowel sounds Musculoskeletal:no cyanosis of digits and no clubbing  PSYCH: alert & oriented x 3 with fluent speech NEURO: no focal motor/sensory deficits SKIN:  no rashes or significant lesions  LABORATORY DATA:  I have reviewed the data as listed Lab Results  Component Value Date   WBC 8.1 05/06/2016   HGB 14.9 05/06/2016   HCT 41.9 05/06/2016   MCV 87.8 05/06/2016   PLT 149 (L) 05/06/2016    Recent Labs  01/31/16 0836 04/02/16 1038 04/12/16 1415 05/06/16 1100  NA 140  --  132* 133*  K 4.1  --  3.2* 2.9*  CL 101  --  100* 93*  CO2 22  --  24 28  GLUCOSE 97  --  102* 94  BUN 12  --  21* 23*  CREATININE 1.10 1.20 1.16 1.27*  CALCIUM 9.1  --  9.1 9.4  GFRNONAA 67  --  >60 55*  GFRAA 78  --  >60 >60  PROT 7.8  --  8.6* 8.8*  ALBUMIN 4.6  --  4.4 4.7  AST 22  --  27 37  ALT 18  --  19 41  ALKPHOS 71  --  66 70  BILITOT 1.0  --  0.7 1.0    RADIOGRAPHIC STUDIES: I have personally reviewed the radiological images as listed and agreed with the findings in the report. Mr Marcus Cortez Wo Contrast  Result Date: 05/03/2016 CLINICAL DATA:  Lung cancer and headaches. Evaluate for metastatic disease. EXAM: MRI  HEAD WITHOUT AND WITH CONTRAST TECHNIQUE: Multiplanar, multiecho pulse sequences of the brain and surrounding structures were obtained without and with intravenous contrast. CONTRAST:  52m MULTIHANCE GADOBENATE DIMEGLUMINE 529 MG/ML IV SOLN COMPARISON:  None. FINDINGS: Brain: No evidence of metastatic disease. No acute infarction, hemorrhage, hydrocephalus, extra-axial collection or mass lesion. Vascular: Normal flow voids. Skull and upper cervical spine:  Normal marrow signal. Sinuses/Orbits: Bilateral cataract resection.  Negative for mass. IMPRESSION: Negative for metastatic disease. Electronically Signed   By: Monte Fantasia M.D.   On: 05/03/2016 11:22   US Carotid Bilateral  Result Date: 04/22/2016 CLINICAL DATA:  Carotid bruits. EXAM: BILATERAL CAROTID DUPLEX ULTRASOUND TECHNIQUE: Pearline Cables scale imaging, color Doppler and duplex ultrasound were performed of bilateral carotid and vertebral arteries in the neck. COMPARISON:  PET-CT 04/09/2016. FINDINGS: Criteria: Quantification of carotid stenosis is based on velocity parameters that correlate the residual internal carotid diameter with NASCET-based stenosis levels, using the diameter of the distal internal carotid lumen as the denominator for stenosis measurement. The following velocity measurements were obtained: RIGHT ICA: 67/15 cm/sec peak systolic flow however the carotid bulb is 309 centimeters/second. CCA:  32/95 cm/sec SYSTOLIC ICA/CCA RATIO:  1.3 DIASTOLIC ICA/CCA RATIO:  1.3 ECA:  123 cm/sec LEFT ICA:  93/19 cm/sec CCA:  18/84 cm/sec SYSTOLIC ICA/CCA RATIO:  1.2 DIASTOLIC ICA/CCA RATIO:  1.0 ECA:  152 cm/sec RIGHT CAROTID ARTERY: Prominent right carotid bifurcation atherosclerotic vascular plaque with probable high-grade stenosis possibly greater than 90% noted. Plaque may be ulcerated. RIGHT VERTEBRAL ARTERY:  Patent with antegrade flow . LEFT CAROTID ARTERY: Moderate left carotid bifurcation atherosclerotic vascular disease. Degree of stenosis less  than 50%. Plaque at the left carotid bifurcation may also be ulcerated. LEFT VERTEBRAL ARTERY:  Patent with antegrade flow. IMPRESSION: 1. Prominent right carotid bifurcation atherosclerotic vascular plaque with probable high-grade stenosis, possibly greater than 90% noted. Atherosclerotic vascular plaque may be ulcerated. 2. Moderate left carotid bifurcation atherosclerotic vascular plaque. Degree of stenosis less than 50%. Left carotid bifurcation plaque may also be ulcerated. 3. Vertebral arteries are patent antegrade flow. Electronically Signed   By: Marcello Moores  Register   On: 04/22/2016 10:38   Nm Pet Image Initial (pi) Skull Base To Thigh  Result Date: 04/09/2016 CLINICAL DATA:  Initial treatment strategy for LEFT lung mass. EXAM: NUCLEAR MEDICINE PET SKULL BASE TO THIGH TECHNIQUE: 12.3 mCi F-18 FDG was injected intravenously. Full-ring PET imaging was performed from the skull base to thigh after the radiotracer. CT data was obtained and used for attenuation correction and anatomic localization. FASTING BLOOD GLUCOSE:  Value: 106 mg/dl COMPARISON:  CT 04/02/2016. FINDINGS: NECK No hypermetabolic lymph nodes in the neck. CHEST Hypermetabolic LEFT suprahilar mass measures 5.2 x 5.4 cm with SUV max equal 10.2. Peripheral nodular thickening along the pleural surface of the LEFT lower lobe measures 2.3 x 1.3 cm with SUV max equal 4.5. No hypermetabolic mediastinal lymph nodes. Centrilobular emphysema and paraseptal emphysema in the upper lobes. ABDOMEN/PELVIS No abnormal metabolic activity adrenal glands. Liver is normal. No hypermetabolic abdominopelvic lymph nodes. Atherosclerotic calcification of the aorta. Several posterior bladder diverticulum noted. Prostate normal. SKELETON No focal hypermetabolic activity to suggest skeletal metastasis. IMPRESSION: 1. Hypermetabolic mass in the LEFT upper lobe is concerning bronchogenic carcinoma. 2. New hypermetabolic focus of consolidation in the LEFT lower lobe is  favored atelectasis or infection. 3. No hypermetabolic mediastinal nodes. Electronically Signed   By: Suzy Bouchard M.D.   On: 04/09/2016 10:15   Ct Biopsy  Result Date: 04/23/2016 INDICATION: 71 year old male with a left upper lobe mass which is hypermetabolic on PET-CT. Recent endobronchial biopsy was negative for malignancy which is discordant with the imaging findings. He presents for CT-guided biopsy. EXAM: CT-guided biopsy left upper lobe pulmonary nodule Interventional Radiologist:  Criselda Peaches, MD MEDICATIONS: None. ANESTHESIA/SEDATION: Fentanyl 3 mcg IV; Versed 100 mg IV Moderate Sedation Time:  21 minutes  The patient was continuously monitored during the procedure by the interventional radiology nurse under my direct supervision. FLUOROSCOPY TIME:  Fluoroscopy Time: 0 minutes 0 seconds (0 mGy). COMPLICATIONS: None immediate. Estimated blood loss:  0 PROCEDURE: Informed written consent was obtained from the patient after a thorough discussion of the procedural risks, benefits and alternatives. All questions were addressed. Maximal Sterile Barrier Technique was utilized including caps, mask, sterile gowns, sterile gloves, sterile drape, hand hygiene and skin antiseptic. A timeout was performed prior to the initiation of the procedure. A planning axial CT scan was performed. The nodule in the left upper lobe was successfully identified. A suitable skin entry site was selected and marked. The region was then sterilely prepped and draped in standard fashion using Betadine skin prep. Local anesthesia was attained by infiltration with 1% lidocaine. A small dermatotomy was made. Under intermittent CT fluoroscopic guidance, a 17 gauge trocar needle was advanced into the lung and positioned at the margin of the nodule. Multiple 18 gauge core biopsies were then coaxially obtained using the BioPince automated biopsy device. Biopsy specimens were placed in formalin and delivered to pathology for further  analysis. The BioSentry device was inserted and deployed. Post biopsy axial CT imaging demonstrates no evidence of immediate complication. There is no pneumothorax. Mild perilesional alveolar hemorrhage is not unexpected. The patient tolerated the procedure well. IMPRESSION: Technically successful CT-guided biopsy left upper lobe pulmonary nodule. Electronically Signed   By: Jacqulynn Cadet M.D.   On: 04/23/2016 13:09   Dg Chest Port 1 View  Result Date: 04/23/2016 CLINICAL DATA:  Status post lung biopsy today EXAM: PORTABLE CHEST 1 VIEW COMPARISON:  CT scan of the chest of today's date and chest x-ray of January 11th 2018. FINDINGS: There is persistent soft tissue density mass in the left perihilar region. No postprocedure pneumothorax is observed. There is no significant pleural effusion. The right lung is adequately inflated. The cardiac silhouette is mildly enlarged. The pulmonary vascularity is normal. There is calcification in the wall of the aortic arch. The observed bony thorax exhibits no acute abnormality. IMPRESSION: No postprocedure pneumothorax or hemothorax following left-sided lung biopsy. Thoracic aortic atherosclerosis. Electronically Signed   By: David  Martinique M.D.   On: 04/23/2016 14:54    ASSESSMENT & PLAN:   Primary cancer of left upper lobe of lung ([place]) # Adenocarcinoma LUL- T3N0 [however non-PET avid mediastinal LN; LLL infiltrative changes]. Recommend chemoradiation definitive; not a surgical candidate.  # I again reviewed the chemotherapy schedule; potential side effects at length. In general patient should have a good tolerance to chemotherapy- Radiation. Also discussed regarding consolidation chemotherapy; and also evaluation with CT scans post treatment. Prescribed antiemetics. Patient extremely concerned about quality of life/ side effects from chemotherapy. Discussed at length again today.  # Bilateral carotid artery stenosis right more than left- awaiting evaluation  with Dr.SChneir this week.    # discussed re: port; will hold off for now. Reviewed that the brain MRI is negative.  # follow up in 2 weeks/ tentatively carbo-taxol weekly chemo/ no labs [based on evaluation/ recommendations from vascular surgery].     Cammie Sickle, MD 05/06/2016 1:06 PM

## 2016-05-06 NOTE — Progress Notes (Signed)
Patient here today for follow up.  Patient states he has had a cough for about a year, requesting cough medication

## 2016-05-06 NOTE — Assessment & Plan Note (Addendum)
#   Adenocarcinoma LUL- T3N0 [however non-PET avid mediastinal LN; LLL infiltrative changes]. Recommend chemoradiation definitive; not a surgical candidate.  # I again reviewed the chemotherapy schedule; potential side effects at length. In general patient should have a good tolerance to chemotherapy- Radiation. Also discussed regarding consolidation chemotherapy; and also evaluation with CT scans post treatment. Prescribed antiemetics. Patient extremely concerned about quality of life/ side effects from chemotherapy. Discussed at length again today.  # Bilateral carotid artery stenosis right more than left- awaiting evaluation with Dr.SChneir this week.    # discussed re: port; will hold off for now. Reviewed that the brain MRI is negative.  # follow up in 2 weeks/ tentatively carbo-taxol weekly chemo/ no labs [based on evaluation/ recommendations from vascular surgery].

## 2016-05-09 ENCOUNTER — Ambulatory Visit (INDEPENDENT_AMBULATORY_CARE_PROVIDER_SITE_OTHER): Payer: Medicare HMO | Admitting: Vascular Surgery

## 2016-05-09 ENCOUNTER — Ambulatory Visit
Admission: RE | Admit: 2016-05-09 | Discharge: 2016-05-09 | Disposition: A | Discharge status: Home / Self Care | Payer: Medicare HMO | Source: Ambulatory Visit | Attending: Vascular Surgery | Admitting: Vascular Surgery

## 2016-05-09 ENCOUNTER — Encounter (INDEPENDENT_AMBULATORY_CARE_PROVIDER_SITE_OTHER): Payer: Self-pay | Admitting: Vascular Surgery

## 2016-05-09 ENCOUNTER — Institutional Professional Consult (permissible substitution): Payer: Medicare HMO | Admitting: Pulmonary Disease

## 2016-05-09 VITALS — BP 142/85 | HR 78 | Resp 16 | Ht 73.0 in | Wt 199.0 lb

## 2016-05-09 DIAGNOSIS — C3492 Malignant neoplasm of unspecified part of left bronchus or lung: Secondary | ICD-10-CM | POA: Diagnosis not present

## 2016-05-09 DIAGNOSIS — I6521 Occlusion and stenosis of right carotid artery: Secondary | ICD-10-CM | POA: Diagnosis not present

## 2016-05-09 DIAGNOSIS — R918 Other nonspecific abnormal finding of lung field: Secondary | ICD-10-CM | POA: Diagnosis not present

## 2016-05-09 DIAGNOSIS — I10 Essential (primary) hypertension: Secondary | ICD-10-CM

## 2016-05-09 DIAGNOSIS — E781 Pure hyperglyceridemia: Secondary | ICD-10-CM | POA: Diagnosis not present

## 2016-05-09 DIAGNOSIS — I708 Atherosclerosis of other arteries: Secondary | ICD-10-CM | POA: Diagnosis not present

## 2016-05-09 DIAGNOSIS — C3412 Malignant neoplasm of upper lobe, left bronchus or lung: Secondary | ICD-10-CM

## 2016-05-09 DIAGNOSIS — I6523 Occlusion and stenosis of bilateral carotid arteries: Secondary | ICD-10-CM | POA: Insufficient documentation

## 2016-05-09 DIAGNOSIS — Z510 Encounter for antineoplastic radiation therapy: Secondary | ICD-10-CM | POA: Diagnosis not present

## 2016-05-09 DIAGNOSIS — I6529 Occlusion and stenosis of unspecified carotid artery: Secondary | ICD-10-CM | POA: Insufficient documentation

## 2016-05-09 DIAGNOSIS — R0602 Shortness of breath: Secondary | ICD-10-CM | POA: Diagnosis not present

## 2016-05-09 DIAGNOSIS — J439 Emphysema, unspecified: Secondary | ICD-10-CM | POA: Diagnosis not present

## 2016-05-09 DIAGNOSIS — R7301 Impaired fasting glucose: Secondary | ICD-10-CM | POA: Diagnosis not present

## 2016-05-09 DIAGNOSIS — J8410 Pulmonary fibrosis, unspecified: Secondary | ICD-10-CM | POA: Diagnosis not present

## 2016-05-09 DIAGNOSIS — M818 Other osteoporosis without current pathological fracture: Secondary | ICD-10-CM | POA: Diagnosis not present

## 2016-05-09 DIAGNOSIS — E785 Hyperlipidemia, unspecified: Secondary | ICD-10-CM | POA: Diagnosis not present

## 2016-05-09 DIAGNOSIS — I2510 Atherosclerotic heart disease of native coronary artery without angina pectoris: Secondary | ICD-10-CM | POA: Diagnosis not present

## 2016-05-09 DIAGNOSIS — Z7982 Long term (current) use of aspirin: Secondary | ICD-10-CM | POA: Diagnosis not present

## 2016-05-09 HISTORY — DX: Essential (primary) hypertension: I10

## 2016-05-09 LAB — SURGICAL PATHOLOGY

## 2016-05-09 MED ORDER — IOPAMIDOL (ISOVUE-370) INJECTION 76%
75.0000 mL | Freq: Once | INTRAVENOUS | Status: AC | PRN
  Administered 2016-05-09: 75 mL via INTRAVENOUS

## 2016-05-09 NOTE — Progress Notes (Signed)
MRN : [id number]  Marcus Cortez is a 71 y.o. (05/08/1945) male who presents with chief complaint of  Chief Complaint  Patient presents with  . New Evaluation    Thorasic Aortic aherosclerosis  .  History of Present Illness: The patient is seen for evaluation of carotid stenosis. The carotid stenosis was identified incidentally by CT scan during staging of his left lung CA.  He is currently stage 3.  He is due to start XRT in about one week and soon after that the plan is for chemotherapy  The patient denies amaurosis fugax. There is no recent history of TIA symptoms or focal motor deficits. There is no prior documented CVA.  There is no history of migraine headaches. There is no history of seizures.  The patient is taking enteric-coated aspirin 81 mg daily.  The patient has a history of coronary artery disease, no recent episodes of angina or shortness of breath. The patient denies PAD or claudication symptoms. There is a history of hyperlipidemia which is being treated with a statin.    Current Meds  Medication Sig  . acetaminophen (TYLENOL) 325 MG tablet Take 650 mg by mouth every 6 (six) hours as needed.  Marland Kitchen amLODipine (NORVASC) 5 MG tablet Take 1 tablet (5 mg total) by mouth daily. (Patient taking differently: Take 10 mg by mouth daily. )  . aspirin 81 MG tablet Take 81 mg by mouth daily.  . fluticasone (FLONASE) 50 MCG/ACT nasal spray Place 2 sprays into both nostrils daily as needed for allergies or rhinitis.   . hydrochlorothiazide (HYDRODIURIL) 25 MG tablet Take 1 tablet (25 mg total) by mouth daily.  . naproxen sodium (ANAPROX) 220 MG tablet Take 220 mg by mouth daily as needed (pain).  . potassium chloride SA (K-DUR,KLOR-CON) 20 MEQ tablet Take 1 tablet (20 mEq total) by mouth 2 (two) times daily.  . Probiotic CAPS Take 1 capsule by mouth daily.    Past Medical History:  Diagnosis Date  . Allergy   . Cancer ([place]) 03/2016   lung  . Carotid artery bruit   .  Emphysema lung ([place])   . Emphysema of lung ([place])   . Former smoker   . Heart murmur   . Hyperlipidemia   . IFG (impaired fasting glucose)   . Osteoporosis   . Pulmonary stenosis     Past Surgical History:  Procedure Laterality Date  . EYE SURGERY  2010   cataract  . FLEXIBLE BRONCHOSCOPY N/A 04/10/2016   Procedure: FLEXIBLE BRONCHOSCOPY;  Surgeon: Wilhelmina Mcardle, MD;  Location: [facility] ORS;  Service: Pulmonary;  Laterality: N/A;    Social History Social History  Substance Use Topics  . Smoking status: Former Smoker    Packs/day: 0.50    Years: 35.00    Types: Cigarettes    Quit date: 01/10/2015  . Smokeless tobacco: Former Systems developer    Types: Chew    Quit date: 04/10/1978  . Alcohol use No    Family History Family History  Problem Relation Age of Onset  . Hypertension Mother   . Thyroid disease Mother   . Glaucoma Mother   . Alzheimer's disease Father   . Stroke Maternal Grandmother   . Cancer Maternal Grandfather   . Alzheimer's disease Paternal Grandmother   . Stroke Paternal Grandfather   No family history of bleeding/clotting disorders, porphyria or autoimmune disease   No Known Allergies   REVIEW OF SYSTEMS (Negative unless checked)  Constitutional: _0 Weight loss  _1 Fever  _2   Chills Cardiac: _0 Chest pain   _1 Chest pressure   _2 Palpitations   _3 Shortness of breath when laying flat   _4 Shortness of breath with exertion. Vascular:  _5 Pain in legs with walking   _6 Pain in legs at rest  _7 History of DVT   _8 Phlebitis   _9 Swelling in legs   _10 Varicose veins   _11 Non-healing ulcers Pulmonary:   _12 Uses home oxygen   _13 Productive cough   _14 Hemoptysis   _15 Wheeze  _16 COPD   _17 Asthma Neurologic:  _18 Dizziness   _19 Seizures   _20 History of stroke   _21 History of TIA  _22 Aphasia   _23 Vissual changes   _24 Weakness or numbness in arm   _25 Weakness or numbness in leg Musculoskeletal:   _26 Joint swelling   _27 Joint pain   _28 Low back pain Hematologic:  _29 Easy bruising  _30 Easy bleeding    _31 Hypercoagulable state   _32 Anemic Gastrointestinal:  _33 Diarrhea   _34 Vomiting  _35 Gastroesophageal reflux/heartburn   _36 Difficulty swallowing. Genitourinary:  _37 Chronic kidney disease   _38 Difficult urination  _39 Frequent urination   _40 Blood in urine Skin:  _41 Rashes   _42 Ulcers  Psychological:  _43 History of anxiety   _44  History of major depression.  Physical Examination  Vitals:   05/09/16 0832  BP: (!) 142/85  Pulse: 78  Resp: 16  Weight: 90.3 kg (199 lb)  Height: _45  (1.854 m)   Body mass index is 26.25 kg/m. Gen: WD/WN, NAD Head: [place]/AT, No temporalis wasting.  Ear/Nose/Throat: Hearing grossly intact, nares w/o erythema or drainage, poor dentition Eyes: PER, EOMI, sclera nonicteric.  Neck: Supple, no masses.  No bruit or JVD.  Pulmonary:  Good air movement, clear to auscultation bilaterally, no use of accessory muscles.  Cardiac: RRR, normal S1, S2, 2/6 SEM. Vascular: harsh right carotid bruit no left bruit Vessel Right Left  Radial Palpable Palpable  Ulnar Palpable Palpable  Brachial Palpable Palpable  Carotid Palpable Palpable  Femoral Palpable Palpable  Popliteal Palpable Palpable  PT Palpable Palpable  DP Palpable Palpable   Gastrointestinal: soft, non-distended. No guarding/no peritoneal signs.  Musculoskeletal: M/S 5/5 throughout.  No deformity or atrophy.  Neurologic: CN 2-12 intact. Pain and light touch intact in extremities.  Symmetrical.  Speech is fluent. Motor exam as listed above. Psychiatric: Judgment intact, Mood & affect appropriate for pt's clinical situation. Dermatologic: No rashes or ulcers noted.  No changes consistent with cellulitis. Lymph : No Cervical lymphadenopathy, no lichenification or skin changes of chronic lymphedema.  CBC Lab Results  Component Value Date   WBC 8.1 05/06/2016   HGB 14.9 05/06/2016   HCT 41.9 05/06/2016   MCV 87.8 05/06/2016   PLT 149 (L) 05/06/2016    BMET    Component Value Date/Time   NA 133 (L) 05/06/2016  1100   NA 140 01/31/2016 0836   K 2.9 (L) 05/06/2016 1100   CL 93 (L) 05/06/2016 1100   CO2 28 05/06/2016 1100   GLUCOSE 94 05/06/2016 1100   BUN 23 (H) 05/06/2016 1100   BUN 12 01/31/2016 0836   CREATININE 1.27 (H) 05/06/2016 1100   CALCIUM 9.4 05/06/2016 1100   GFRNONAA 55 (L) 05/06/2016 1100   GFRAA >60 05/06/2016 1100   Estimated Creatinine Clearance: 59.4 mL/min (by C-G formula based on SCr of 1.27 mg/dL (H)).  COAG Lab Results  Component Value Date   INR 1.00 04/23/2016    Radiology [name] ON Contrast  Result Date: 05/03/2016 CLINICAL DATA:  Lung cancer and headaches. Evaluate for metastatic disease. EXAM: MRI HEAD WITHOUT AND WITH CONTRAST TECHNIQUE: Multiplanar, multiecho  pulse sequences of the brain and surrounding structures were obtained without and with intravenous contrast. CONTRAST:  4m MULTIHANCE GADOBENATE DIMEGLUMINE 529 MG/ML IV SOLN COMPARISON:  None. FINDINGS: Brain: No evidence of metastatic disease. No acute infarction, hemorrhage, hydrocephalus, extra-axial collection or mass lesion. Vascular: Normal flow voids. Skull and upper cervical spine: Normal marrow signal. Sinuses/Orbits: Bilateral cataract resection.  Negative for mass. IMPRESSION: Negative for metastatic disease. Electronically Signed   By: JMonte FantasiaM.D.   On: 05/03/2016 11:22   UKoreaCarotid Bilateral  Result Date: 04/22/2016 CLINICAL DATA:  Carotid bruits. EXAM: BILATERAL CAROTID DUPLEX ULTRASOUND TECHNIQUE: GPearline Cablesscale imaging, color Doppler and duplex ultrasound were performed of bilateral carotid and vertebral arteries in the neck. COMPARISON:  PET-CT 04/09/2016. FINDINGS: Criteria: Quantification of carotid stenosis is based on velocity parameters that correlate the residual internal carotid diameter with NASCET-based stenosis levels, using the diameter of the distal internal carotid lumen as the denominator for stenosis measurement. The following velocity measurements were obtained: RIGHT  ICA: 67/15 cm/sec peak systolic flow however the carotid bulb is 309 centimeters/second. CCA:  539/76cm/sec SYSTOLIC ICA/CCA RATIO:  1.3 DIASTOLIC ICA/CCA RATIO:  1.3 ECA:  123 cm/sec LEFT ICA:  93/19 cm/sec CCA:  773/41cm/sec SYSTOLIC ICA/CCA RATIO:  1.2 DIASTOLIC ICA/CCA RATIO:  1.0 ECA:  152 cm/sec RIGHT CAROTID ARTERY: Prominent right carotid bifurcation atherosclerotic vascular plaque with probable high-grade stenosis possibly greater than 90% noted. Plaque may be ulcerated. RIGHT VERTEBRAL ARTERY:  Patent with antegrade flow . LEFT CAROTID ARTERY: Moderate left carotid bifurcation atherosclerotic vascular disease. Degree of stenosis less than 50%. Plaque at the left carotid bifurcation may also be ulcerated. LEFT VERTEBRAL ARTERY:  Patent with antegrade flow. IMPRESSION: 1. Prominent right carotid bifurcation atherosclerotic vascular plaque with probable high-grade stenosis, possibly greater than 90% noted. Atherosclerotic vascular plaque may be ulcerated. 2. Moderate left carotid bifurcation atherosclerotic vascular plaque. Degree of stenosis less than 50%. Left carotid bifurcation plaque may also be ulcerated. 3. Vertebral arteries are patent antegrade flow. Electronically Signed   By: TMarcello Moores Register   On: 04/22/2016 10:38   Ct Biopsy  Result Date: 04/23/2016 INDICATION: 71year old male with a left upper lobe mass which is hypermetabolic on PET-CT. Recent endobronchial biopsy was negative for malignancy which is discordant with the imaging findings. He presents for CT-guided biopsy. EXAM: CT-guided biopsy left upper lobe pulmonary nodule Interventional Radiologist:  HCriselda Peaches MD MEDICATIONS: None. ANESTHESIA/SEDATION: Fentanyl 3 mcg IV; Versed 100 mg IV Moderate Sedation Time:  21 minutes The patient was continuously monitored during the procedure by the interventional radiology nurse under my direct supervision. FLUOROSCOPY TIME:  Fluoroscopy Time: 0 minutes 0 seconds (0 mGy).  COMPLICATIONS: None immediate. Estimated blood loss:  0 PROCEDURE: Informed written consent was obtained from the patient after a thorough discussion of the procedural risks, benefits and alternatives. All questions were addressed. Maximal Sterile Barrier Technique was utilized including caps, mask, sterile gowns, sterile gloves, sterile drape, hand hygiene and skin antiseptic. A timeout was performed prior to the initiation of the procedure. A planning axial CT scan was performed. The nodule in the left upper lobe was successfully identified. A suitable skin entry site was selected and marked. The region was then sterilely prepped and draped in standard fashion using Betadine skin prep. Local anesthesia was attained by infiltration with 1% lidocaine. A small dermatotomy was made. Under intermittent CT fluoroscopic guidance, a 17 gauge trocar needle was advanced into the lung and positioned at the margin of the  nodule. Multiple 18 gauge core biopsies were then coaxially obtained using the BioPince automated biopsy device. Biopsy specimens were placed in formalin and delivered to pathology for further analysis. The BioSentry device was inserted and deployed. Post biopsy axial CT imaging demonstrates no evidence of immediate complication. There is no pneumothorax. Mild perilesional alveolar hemorrhage is not unexpected. The patient tolerated the procedure well. IMPRESSION: Technically successful CT-guided biopsy left upper lobe pulmonary nodule. Electronically Signed   By: Jacqulynn Cadet M.D.   On: 04/23/2016 13:09   Dg Chest Port 1 View  Result Date: 04/23/2016 CLINICAL DATA:  Status post lung biopsy today EXAM: PORTABLE CHEST 1 VIEW COMPARISON:  CT scan of the chest of today's date and chest x-ray of January 11th 2018. FINDINGS: There is persistent soft tissue density mass in the left perihilar region. No postprocedure pneumothorax is observed. There is no significant pleural effusion. The right lung is  adequately inflated. The cardiac silhouette is mildly enlarged. The pulmonary vascularity is normal. There is calcification in the wall of the aortic arch. The observed bony thorax exhibits no acute abnormality. IMPRESSION: No postprocedure pneumothorax or hemothorax following left-sided lung biopsy. Thoracic aortic atherosclerosis. Electronically Signed   By: David  Martinique M.D.   On: 04/23/2016 14:54    Assessment/Plan 1. Stenosis of right carotid artery The patient remains asymptomatic with respect to the carotid stenosis.  However, the patient has now progressed and has a lesion the is >70%.  This is a very complicated situation given the upcoming treatment of his lung CA and his long term survival.  I will discuss with oncology the 5 year prognosis and if this is reasonable then he should undergo treatment of his carotid.  Given his up coming cancer treatment stenting would be preferred over surgery if feasible.  Patient should undergo CT angiography of the carotid arteries to define the degree of stenosis of the internal carotid arteries bilaterally and the anatomic suitability for surgery vs. intervention.  Given the fact that hsi true coronary status is unknown and that there are calcifications consistent with CAD noted on his CT scan he will need  cardiac clearance, once cleared the patient will be scheduled for treatment.  The risks, benefits and alternative therapies were reviewed in detail with the patient.  All questions were answered.  The patient agrees to proceed with imaging and subsequent treatment pending Oncology and Cardiology input.  Continue antiplatelet therapy as prescribed. Continue management of CAD, HTN and Hyperlipidemia. Healthy heart diet, encouraged exercise at least 4 times per week.    A total of 70 minutes was spent with this patient and greater than 50% was spent in counseling and coordination of care with the patient.  Discussion included the treatment options  for vascular disease including indications for surgery and intervention.  Also discussed is the appropriate timing of treatment.  In addition medical therapy was discussed.  Both Cardiology and Oncology were contacted by phone to coordinate care.   2. Primary cancer of left upper lobe of lung ([place]) Chemo and XRT planned  3. Pure hyperglyceridemia Continue statin as ordered and reviewed, no changes at this time  4.  Hypertension, unspecified type Continue antihypertensive medications as already ordered, these medications have been reviewed and there are no changes at this time.  5.  CAD I have made arrangements for his to see Dr Althia Forts, MD  05/09/2016 9:59 AM

## 2016-05-09 NOTE — Patient Instructions (Signed)

## 2016-05-10 ENCOUNTER — Encounter: Payer: Self-pay | Admitting: Internal Medicine

## 2016-05-13 ENCOUNTER — Encounter: Payer: Self-pay | Admitting: Cardiovascular Disease

## 2016-05-13 ENCOUNTER — Ambulatory Visit (INDEPENDENT_AMBULATORY_CARE_PROVIDER_SITE_OTHER): Payer: Medicare HMO | Admitting: Cardiovascular Disease

## 2016-05-13 ENCOUNTER — Telehealth: Payer: Self-pay | Admitting: *Deleted

## 2016-05-13 VITALS — BP 138/80 | HR 71 | Ht 73.0 in | Wt 197.8 lb

## 2016-05-13 DIAGNOSIS — E781 Pure hyperglyceridemia: Secondary | ICD-10-CM

## 2016-05-13 DIAGNOSIS — I2510 Atherosclerotic heart disease of native coronary artery without angina pectoris: Secondary | ICD-10-CM | POA: Diagnosis not present

## 2016-05-13 DIAGNOSIS — I10 Essential (primary) hypertension: Secondary | ICD-10-CM

## 2016-05-13 DIAGNOSIS — I700 Atherosclerosis of aorta: Secondary | ICD-10-CM | POA: Diagnosis not present

## 2016-05-13 DIAGNOSIS — I6521 Occlusion and stenosis of right carotid artery: Secondary | ICD-10-CM

## 2016-05-13 DIAGNOSIS — E782 Mixed hyperlipidemia: Secondary | ICD-10-CM | POA: Diagnosis not present

## 2016-05-13 MED ORDER — LOSARTAN POTASSIUM 100 MG PO TABS: 100.0000 mg | ORAL_TABLET | Freq: Every day | ORAL | 11 refills | Status: AC

## 2016-05-13 MED ORDER — EZETIMIBE 10 MG PO TABS: 10.0000 mg | ORAL_TABLET | Freq: Every day | ORAL | 11 refills | Status: AC

## 2016-05-13 MED ORDER — EZETIMIBE 10 MG PO TABS: 10.0000 mg | ORAL_TABLET | Freq: Every day | ORAL | 11 refills | Status: DC

## 2016-05-13 MED ORDER — AMLODIPINE BESYLATE 10 MG PO TABS: 10.0000 mg | ORAL_TABLET | Freq: Every day | ORAL | 3 refills | Status: AC

## 2016-05-13 NOTE — Telephone Encounter (Signed)
Call returned- I spoke with daughter Claiborne Billings, She will call me tomorrow once she knows when the stent placement date will be. Dr. Rogue Bussing stated that the patient should not wait a month to get started on treatment no more than 1 to 2 weeks. She stated that her father was leaning towards postpone apts by 1 week.    ----- Message ----- From: Hazeline Junker Sent: 05/13/2016   3:33 PM To: Sabino Gasser, RN  Per daughter Claiborne Billings need to cancel chemo class tomorrow due to patient having stent placed this week, she thinks his chemo cannot start until a month after, please call daughter Claiborne Billings with instructions as to what to do about chemo class and future chemo appt. T

## 2016-05-13 NOTE — Patient Instructions (Addendum)
Medication Instructions:   Please stop the HCTZ Continue  potassium twice a day for another 5 days then stop  Start losartan 1/2 pill once a day Wednesday  Start zetia one a day for cholesterol  We will look into repatha/praluent  Please call when you would like a stress test (lexiscan or treadmill) myoview   Labwork:  No new labs needed  Testing/Procedures:  No further testing at this time   I recommend watching educational videos on topics of interest to you at:       [email or web address]  Enter code: [email or web address]    Follow-Up: It was a pleasure seeing you in the office today. Please call us if you have new issues that need to be addressed before your next appt.  [phone]  Your physician wants you to follow-up in: 6 months.  You will receive a reminder letter in the mail two months in advance. If you don't receive a letter, please call our office to schedule the follow-up appointment.  If you need a refill on your cardiac medications before your next appointment, please call your pharmacy.

## 2016-05-13 NOTE — Progress Notes (Addendum)
Cardiology Office Note  Date:  05/13/2016   ID:  Marcus Cortez, DOB [birth year], MRN [id number]  PCP:  Kathrine Haddock, NP   Chief Complaint  Patient presents with  . other    Ref by Dr. Ronalee Belts, pt has "bilateral carotid artery stenosis right more than left- awaiting evaluation," "not a surgical candidate given his tumor close to the mediastinum; and also the fact he is a critical carotid artery stenosis." Needs a cardiac clearance for carotid artery stent placement.     HPI:  71 year old gentleman with long history of smoking, emphysema, peripheral arterial disease Greater than 90% carotid stenosis on the right , on ultrasound (70% on CT scan) Left with 50% disease History of adenocarcinoma left upper lobe,  Not a surgical candidate given the tumors close to the mediastinum Mild thrombocytopenia He presents today by referral from Dr. Ronalee Belts for consultation of his severe carotid stenosis, preoperative evaluation  Recently seen by oncology, plan to start chemotherapy, radiation later this week  CT scan showing 70% bilateral proximal subclavian arterial disease Images reviewed by myself in detail in the office with the patient CT scan chest showing moderate aortic atherosclerosis in the arch, at least moderate diffuse three-vessel coronary calcification Mild to moderate diffuse descending aorta calcified and soft plaque  He is active, until recently was very active at home mowing etc. Has been less active since the diagnosis of lung cancer and with recent sinusitis Denies any shortness of breath on exertion, no chest discomfort concerning for angina  He does report having headaches end of last year, seem to improve on amlodipine Blood pressure was running high at the time 01/2015: sinus infection 02/2016: sinus drainage statrted on ABX by ENT  Long discussion with him concerning management of cholesterol/hyperlipidemia Tried numerous statins with Dr. Julian Hy. Reports having  debilitating myalgias on almost all of the statins Has severe PAD, carotid disease, also with diffuse coronary calcifications consistent with underlying coronary artery disease, aortic atherosclerosis  Has not tried Zetia yet, only taking gemfibrozil  EKG on today's visit shows normal sinus rhythm with rate 69 bpm left axis deviation, no significant ST or T-wave changes   PMH:   has a past medical history of Allergy; Cancer ([place]) (03/2016); Carotid artery bruit; Emphysema lung ([place]); Emphysema of lung ([place]); Former smoker; Heart murmur; Hyperlipidemia; Hypertension; IFG (impaired fasting glucose); Osteoporosis; and Pulmonary stenosis.  PSH:    Past Surgical History:  Procedure Laterality Date  . EYE SURGERY  2010   cataract  . FLEXIBLE BRONCHOSCOPY N/A 04/10/2016   Procedure: FLEXIBLE BRONCHOSCOPY;  Surgeon: Wilhelmina Mcardle, MD;  Location: [facility] ORS;  Service: Pulmonary;  Laterality: N/A;    Current Outpatient Prescriptions  Medication Sig Dispense Refill  . acetaminophen (TYLENOL) 325 MG tablet Take 650 mg by mouth every 6 (six) hours as needed.    Marland Kitchen amLODipine (NORVASC) 10 MG tablet Take 1 tablet (10 mg total) by mouth daily. 90 tablet 3  . aspirin 81 MG tablet Take 81 mg by mouth daily.    . fluticasone (FLONASE) 50 MCG/ACT nasal spray Place 2 sprays into both nostrils daily as needed for allergies or rhinitis.     Marland Kitchen gemfibrozil (LOPID) 600 MG tablet Take 600 mg by mouth 2 (two) times daily before a meal.    . ezetimibe (ZETIA) 10 MG tablet Take 1 tablet (10 mg total) by mouth daily. 30 tablet 11  . losartan (COZAAR) 100 MG tablet Take 1 tablet (100 mg total) by mouth daily.  30 tablet 11  . naproxen sodium (ANAPROX) 220 MG tablet Take 220 mg by mouth daily as needed (pain).    . Probiotic CAPS Take 1 capsule by mouth daily.     No current facility-administered medications for this visit.      Allergies:   Patient has no known allergies.   Social History:  The patient  reports  that he quit smoking about 16 months ago. His smoking use included Cigarettes. He has a 17.50 pack-year smoking history. He quit smokeless tobacco use about 38 years ago. His smokeless tobacco use included Chew. He reports that he does not drink alcohol or use drugs.   Family History:   family history includes Alzheimer's disease in his father and paternal grandmother; Cancer in his maternal grandfather; Glaucoma in his mother; Hypertension in his mother; Stroke in his maternal grandmother and paternal grandfather; Thyroid disease in his mother.    Review of Systems: Review of Systems  Constitutional: Negative.   Respiratory: Negative.   Cardiovascular: Negative.   Gastrointestinal: Negative.   Musculoskeletal: Negative.   Neurological: Negative.   Psychiatric/Behavioral: Negative.   All other systems reviewed and are negative.    PHYSICAL EXAM: VS:  BP 138/80 (BP Location: Right Arm, Patient Position: Sitting, Cuff Size: Normal)   Pulse 71   Ht _0  (1.854 m)   Wt 197 lb 12 oz (89.7 kg)   BMI 26.09 kg/m  , BMI Body mass index is 26.09 kg/m. GEN: Well nourished, well developed, in no acute distress  HEENT: normal  Neck: no JVD,1+ carotid bruit on the right, no masses Cardiac: RRR; no murmurs, rubs, or gallops,no edema  Respiratory:  clear to auscultation bilaterally, normal work of breathing GI: soft, nontender, nondistended, + BS MS: no deformity or atrophy  Skin: warm and dry, no rash Neuro:  Strength and sensation are intact Psych: euthymic mood, full affect    Recent Labs: 05/06/2016: ALT 41; BUN 23; Creatinine, Ser 1.27; Hemoglobin 14.9; Platelets 149; Potassium 2.9; Sodium 133    Lipid Panel Lab Results  Component Value Date   CHOL 206 (H) 01/31/2016   HDL 25 (L) 04/25/2015   LDLCALC 122 (H) 04/25/2015   TRIG 270 (H) 01/31/2016      Wt Readings from Last 3 Encounters:  05/13/16 197 lb 12 oz (89.7 kg)  05/09/16 199 lb (90.3 kg)  05/06/16 197 lb 2 oz (89.4  kg)       ASSESSMENT AND PLAN:  Mixed hyperlipidemia Plan: EKG 12-Lead Long history of statin intolerance, previously had numerous attempts made by primary care /Dr. Julian Hy to start a statin.  long history of myalgias on almost all statins  Tolerating gemfibrozil  We will start Zetia daily  Goal LDL less than 70 given diffuse peripheral arterial disease/severe carotid disease, aortic atherosclerosis, heavy coronary artery disease also seen on CT scan  He is interested in learning more about  Repatha/praluent. Last for help from [facility] to see if he qualifies   Hypertension, unspecified type - Plan: EKG 12-Lead  significant problems with hypokalemia on HCTZ . Recent potassium 2.9, currently potassium increased up to twice a day . Recommended he stop the HCTZ . We'll await a day and then start losartan 50 mg daily  (one half of a 100 mg pill ) Suggested he stay on potassium for 5 more days and then stop the medication . He has BMP in 10 days . If blood pressure runs high, we could increase losartan up to 100  mg daily  Creatinine mildly elevated likely secondary to prerenal state   Stenosis of right carotid artery - Plan: EKG 12-Lead  he is  acceptable risk for stenting of his right carotid with Dr.  Ronalee Belts no further testing needed .  he does not require general anesthesia, has not had any anginal symptoms  Normal EKG   Aortic atherosclerosis (HCC) Mild to moderate diffuse disease.  Coronary artery disease involving native coronary artery of native heart without angina pectoris Three-vessel disease, significant coronary calcifications  No significant EKG changes, no symptoms of angina  Recommended at a later date after his stent procedure, after radiation , he consider stress Myoview test     Total encounter time more than 60 minutes  Greater than 50% was spent in counseling and coordination of care with the patient   Disposition:   F/U  6 months   Orders Placed This  Encounter  Procedures  . EKG 12-Lead     Signed, Esmond Plants, M.D., Ph.D. 05/13/2016  [place], [place]

## 2016-05-14 ENCOUNTER — Inpatient Hospital Stay: Payer: Medicare HMO

## 2016-05-15 ENCOUNTER — Encounter: Payer: Self-pay | Admitting: Internal Medicine

## 2016-05-15 ENCOUNTER — Ambulatory Visit
Admission: RE | Admit: 2016-05-15 | Discharge: 2016-05-15 | Disposition: A | Discharge status: Home / Self Care | Payer: Medicare HMO | Source: Ambulatory Visit | Attending: Radiation Oncology | Admitting: Radiation Oncology

## 2016-05-16 ENCOUNTER — Ambulatory Visit
Admission: RE | Admit: 2016-05-16 | Discharge: 2016-05-16 | Disposition: A | Discharge status: Home / Self Care | Payer: Medicare HMO | Source: Ambulatory Visit | Attending: Radiation Oncology | Admitting: Radiation Oncology

## 2016-05-16 ENCOUNTER — Other Ambulatory Visit (INDEPENDENT_AMBULATORY_CARE_PROVIDER_SITE_OTHER): Payer: Self-pay | Admitting: Vascular Surgery

## 2016-05-16 ENCOUNTER — Encounter (INDEPENDENT_AMBULATORY_CARE_PROVIDER_SITE_OTHER): Payer: Self-pay

## 2016-05-16 DIAGNOSIS — M818 Other osteoporosis without current pathological fracture: Secondary | ICD-10-CM | POA: Diagnosis not present

## 2016-05-16 DIAGNOSIS — C3412 Malignant neoplasm of upper lobe, left bronchus or lung: Secondary | ICD-10-CM | POA: Diagnosis not present

## 2016-05-16 DIAGNOSIS — I2510 Atherosclerotic heart disease of native coronary artery without angina pectoris: Secondary | ICD-10-CM | POA: Diagnosis not present

## 2016-05-16 DIAGNOSIS — Z510 Encounter for antineoplastic radiation therapy: Secondary | ICD-10-CM | POA: Diagnosis not present

## 2016-05-16 DIAGNOSIS — R7301 Impaired fasting glucose: Secondary | ICD-10-CM | POA: Diagnosis not present

## 2016-05-16 DIAGNOSIS — Z7982 Long term (current) use of aspirin: Secondary | ICD-10-CM | POA: Diagnosis not present

## 2016-05-16 DIAGNOSIS — J439 Emphysema, unspecified: Secondary | ICD-10-CM | POA: Diagnosis not present

## 2016-05-16 DIAGNOSIS — J8410 Pulmonary fibrosis, unspecified: Secondary | ICD-10-CM | POA: Diagnosis not present

## 2016-05-16 DIAGNOSIS — R0602 Shortness of breath: Secondary | ICD-10-CM | POA: Diagnosis not present

## 2016-05-16 DIAGNOSIS — E785 Hyperlipidemia, unspecified: Secondary | ICD-10-CM | POA: Diagnosis not present

## 2016-05-17 ENCOUNTER — Ambulatory Visit
Admission: RE | Admit: 2016-05-17 | Discharge: 2016-05-17 | Disposition: A | Discharge status: Home / Self Care | Payer: Medicare HMO | Source: Ambulatory Visit | Attending: Radiation Oncology | Admitting: Radiation Oncology

## 2016-05-17 DIAGNOSIS — Z510 Encounter for antineoplastic radiation therapy: Secondary | ICD-10-CM | POA: Diagnosis not present

## 2016-05-17 DIAGNOSIS — R0602 Shortness of breath: Secondary | ICD-10-CM | POA: Diagnosis not present

## 2016-05-17 DIAGNOSIS — J8410 Pulmonary fibrosis, unspecified: Secondary | ICD-10-CM | POA: Diagnosis not present

## 2016-05-17 DIAGNOSIS — Z7982 Long term (current) use of aspirin: Secondary | ICD-10-CM | POA: Diagnosis not present

## 2016-05-17 DIAGNOSIS — I2510 Atherosclerotic heart disease of native coronary artery without angina pectoris: Secondary | ICD-10-CM | POA: Diagnosis not present

## 2016-05-17 DIAGNOSIS — M818 Other osteoporosis without current pathological fracture: Secondary | ICD-10-CM | POA: Diagnosis not present

## 2016-05-17 DIAGNOSIS — C3412 Malignant neoplasm of upper lobe, left bronchus or lung: Secondary | ICD-10-CM | POA: Diagnosis not present

## 2016-05-17 DIAGNOSIS — J439 Emphysema, unspecified: Secondary | ICD-10-CM | POA: Diagnosis not present

## 2016-05-17 DIAGNOSIS — R7301 Impaired fasting glucose: Secondary | ICD-10-CM | POA: Diagnosis not present

## 2016-05-17 DIAGNOSIS — E785 Hyperlipidemia, unspecified: Secondary | ICD-10-CM | POA: Diagnosis not present

## 2016-05-20 ENCOUNTER — Telehealth: Payer: Self-pay

## 2016-05-20 ENCOUNTER — Ambulatory Visit: Payer: Medicare HMO

## 2016-05-20 DIAGNOSIS — Z79899 Other long term (current) drug therapy: Secondary | ICD-10-CM

## 2016-05-20 DIAGNOSIS — E785 Hyperlipidemia, unspecified: Secondary | ICD-10-CM

## 2016-05-20 MED ORDER — ROSUVASTATIN CALCIUM 5 MG PO TABS: 5.0000 mg | ORAL_TABLET | ORAL | 6 refills | Status: DC

## 2016-05-20 NOTE — Telephone Encounter (Signed)
Spoke w/ pt.  Advised him of Dr. Donivan Scull recommendation.  He is agreeable and will call back w/ any questions or concerns.

## 2016-05-20 NOTE — Telephone Encounter (Signed)
-----   Message from Minna Merritts, MD sent at 05/19/2016  7:56 PM EDT ----- Regarding: RE: Praluent/Repatha Would he be willing to retry low dose crestor 5 mg twice a week with zetia daily Recheck lipids in 2 months  We need statin trials from cheryl wicker thx TG  ----- Message ----- From: Stana Bunting, RN Sent: 05/16/2016   8:12 AM To: Minna Merritts, MD Subject: Melton Alar: Praluent/Repatha                             ----- Message ----- From: Leeroy Bock, RPH Sent: 05/15/2016   8:06 AM To: Stana Bunting, RN Subject: RE: Praluent/Repatha                           We would need documents from his PCP with specific statins and doses that he's tried in the past in order for a prior auth to be approved - we do not have any available with Epic. Since he was just started on Zetia, we would also need to wait to see what his lipid panel looks like with him on Zetia therapy - ideally would recheck lipids in ~2 months. Depending on previous statin intolerances and records available, could pursue PCKS9i at that time. Looking at his most recent LDL, Zetia and low dose Crestor a few days a week might bring his LDL to goal without needing PCSK9i which would cost at minimum ~$100-200 per month with his Medicare insurance.   ----- Message ----- From: Stana Bunting, RN Sent: 05/13/2016   2:41 PM To: Leeroy Bock, RPH Subject: Praluent/Repatha                               [facility] Butt! Dr. Rockey Situ would like to start this gentleman on Repatha or Praluent.  He is sched for carotid surgery w/ Dr. Delana Meyer as soon as Dr. Rockey Situ sends over cardiac clearance.  Please let me know if you need anything from me.  [facility]

## 2016-05-21 ENCOUNTER — Ambulatory Visit
Admission: RE | Admit: 2016-05-21 | Discharge: 2016-05-21 | Disposition: A | Discharge status: Home / Self Care | Payer: Medicare HMO | Source: Ambulatory Visit | Attending: Radiation Oncology | Admitting: Radiation Oncology

## 2016-05-21 ENCOUNTER — Encounter
Admission: RE | Admit: 2016-05-21 | Discharge: 2016-05-21 | Disposition: A | Discharge status: Home / Self Care | Payer: Medicare HMO | Source: Ambulatory Visit | Attending: Vascular Surgery | Admitting: Vascular Surgery

## 2016-05-21 DIAGNOSIS — I2510 Atherosclerotic heart disease of native coronary artery without angina pectoris: Secondary | ICD-10-CM | POA: Diagnosis not present

## 2016-05-21 DIAGNOSIS — Z809 Family history of malignant neoplasm, unspecified: Secondary | ICD-10-CM | POA: Diagnosis not present

## 2016-05-21 DIAGNOSIS — C3412 Malignant neoplasm of upper lobe, left bronchus or lung: Secondary | ICD-10-CM | POA: Diagnosis not present

## 2016-05-21 DIAGNOSIS — M818 Other osteoporosis without current pathological fracture: Secondary | ICD-10-CM | POA: Diagnosis not present

## 2016-05-21 DIAGNOSIS — R0602 Shortness of breath: Secondary | ICD-10-CM | POA: Diagnosis not present

## 2016-05-21 DIAGNOSIS — I6529 Occlusion and stenosis of unspecified carotid artery: Secondary | ICD-10-CM | POA: Insufficient documentation

## 2016-05-21 DIAGNOSIS — E785 Hyperlipidemia, unspecified: Secondary | ICD-10-CM | POA: Diagnosis not present

## 2016-05-21 DIAGNOSIS — Z01812 Encounter for preprocedural laboratory examination: Secondary | ICD-10-CM

## 2016-05-21 DIAGNOSIS — Z7982 Long term (current) use of aspirin: Secondary | ICD-10-CM | POA: Diagnosis not present

## 2016-05-21 DIAGNOSIS — J439 Emphysema, unspecified: Secondary | ICD-10-CM | POA: Diagnosis not present

## 2016-05-21 DIAGNOSIS — Z510 Encounter for antineoplastic radiation therapy: Secondary | ICD-10-CM | POA: Diagnosis not present

## 2016-05-21 DIAGNOSIS — R7301 Impaired fasting glucose: Secondary | ICD-10-CM | POA: Diagnosis not present

## 2016-05-21 DIAGNOSIS — J8410 Pulmonary fibrosis, unspecified: Secondary | ICD-10-CM | POA: Diagnosis not present

## 2016-05-21 DIAGNOSIS — Z79899 Other long term (current) drug therapy: Secondary | ICD-10-CM | POA: Diagnosis not present

## 2016-05-21 DIAGNOSIS — Z87891 Personal history of nicotine dependence: Secondary | ICD-10-CM | POA: Diagnosis not present

## 2016-05-21 LAB — TYPE AND SCREEN
ABO/RH(D): A POS
Antibody Screen: NEGATIVE

## 2016-05-21 LAB — CBC
HCT: 38.8 % — ABNORMAL LOW (ref 40.0–52.0)
Hemoglobin: 13.6 g/dL (ref 13.0–18.0)
MCH: 31.3 pg (ref 26.0–34.0)
MCHC: 34.9 g/dL (ref 32.0–36.0)
MCV: 89.6 fL (ref 80.0–100.0)
Platelets: 138 10*3/uL — ABNORMAL LOW (ref 150–440)
RBC: 4.33 MIL/uL — ABNORMAL LOW (ref 4.40–5.90)
RDW: 14.6 % — AB (ref 11.5–14.5)
WBC: 7.2 10*3/uL (ref 3.8–10.6)

## 2016-05-21 LAB — PROTIME-INR
INR: 1.02
Prothrombin Time: 13.4 seconds (ref 11.4–15.2)

## 2016-05-21 LAB — BUN: BUN: 20 mg/dL (ref 6–20)

## 2016-05-21 LAB — APTT: aPTT: 29 seconds (ref 24–36)

## 2016-05-21 MED ORDER — FAMOTIDINE 20 MG PO TABS: 40.0000 mg | ORAL_TABLET | ORAL | Status: DC | PRN

## 2016-05-21 MED ORDER — CEFAZOLIN IN D5W 1 GM/50ML IV SOLN
1.0000 g | Freq: Once | INTRAVENOUS | Status: AC
  Administered 2016-05-22: 1 g via INTRAVENOUS

## 2016-05-21 MED ORDER — METHYLPREDNISOLONE SODIUM SUCC 125 MG IJ SOLR: 125.0000 mg | INTRAMUSCULAR | Status: DC | PRN

## 2016-05-22 ENCOUNTER — Inpatient Hospital Stay
Admission: RE | Admit: 2016-05-22 | Discharge: 2016-05-23 | DRG: 035 | Disposition: A | Discharge status: Home / Self Care | Payer: Medicare HMO | Source: Ambulatory Visit | Attending: Vascular Surgery | Admitting: Vascular Surgery

## 2016-05-22 ENCOUNTER — Encounter
Admission: RE | Disposition: A | Discharge status: Home / Self Care | Payer: Self-pay | Source: Ambulatory Visit | Attending: Vascular Surgery

## 2016-05-22 ENCOUNTER — Ambulatory Visit: Payer: Medicare HMO | Admitting: Internal Medicine

## 2016-05-22 ENCOUNTER — Ambulatory Visit: Payer: Medicare HMO

## 2016-05-22 ENCOUNTER — Other Ambulatory Visit: Payer: Medicare HMO

## 2016-05-22 DIAGNOSIS — Z510 Encounter for antineoplastic radiation therapy: Secondary | ICD-10-CM | POA: Diagnosis not present

## 2016-05-22 DIAGNOSIS — J439 Emphysema, unspecified: Secondary | ICD-10-CM | POA: Diagnosis present

## 2016-05-22 DIAGNOSIS — R0602 Shortness of breath: Secondary | ICD-10-CM | POA: Diagnosis not present

## 2016-05-22 DIAGNOSIS — C3412 Malignant neoplasm of upper lobe, left bronchus or lung: Secondary | ICD-10-CM | POA: Diagnosis not present

## 2016-05-22 DIAGNOSIS — Z7982 Long term (current) use of aspirin: Secondary | ICD-10-CM

## 2016-05-22 DIAGNOSIS — Z8673 Personal history of transient ischemic attack (TIA), and cerebral infarction without residual deficits: Secondary | ICD-10-CM | POA: Diagnosis not present

## 2016-05-22 DIAGNOSIS — Z8249 Family history of ischemic heart disease and other diseases of the circulatory system: Secondary | ICD-10-CM | POA: Diagnosis not present

## 2016-05-22 DIAGNOSIS — Z809 Family history of malignant neoplasm, unspecified: Secondary | ICD-10-CM

## 2016-05-22 DIAGNOSIS — M818 Other osteoporosis without current pathological fracture: Secondary | ICD-10-CM | POA: Diagnosis not present

## 2016-05-22 DIAGNOSIS — I2510 Atherosclerotic heart disease of native coronary artery without angina pectoris: Secondary | ICD-10-CM | POA: Diagnosis present

## 2016-05-22 DIAGNOSIS — I10 Essential (primary) hypertension: Secondary | ICD-10-CM | POA: Diagnosis present

## 2016-05-22 DIAGNOSIS — Z79899 Other long term (current) drug therapy: Secondary | ICD-10-CM | POA: Diagnosis not present

## 2016-05-22 DIAGNOSIS — Z823 Family history of stroke: Secondary | ICD-10-CM

## 2016-05-22 DIAGNOSIS — R7301 Impaired fasting glucose: Secondary | ICD-10-CM | POA: Diagnosis not present

## 2016-05-22 DIAGNOSIS — E781 Pure hyperglyceridemia: Secondary | ICD-10-CM | POA: Diagnosis present

## 2016-05-22 DIAGNOSIS — E785 Hyperlipidemia, unspecified: Secondary | ICD-10-CM | POA: Diagnosis present

## 2016-05-22 DIAGNOSIS — I6521 Occlusion and stenosis of right carotid artery: Secondary | ICD-10-CM | POA: Diagnosis not present

## 2016-05-22 DIAGNOSIS — Z87891 Personal history of nicotine dependence: Secondary | ICD-10-CM

## 2016-05-22 DIAGNOSIS — J8410 Pulmonary fibrosis, unspecified: Secondary | ICD-10-CM | POA: Diagnosis not present

## 2016-05-22 HISTORY — PX: CAROTID PTA/STENT INTERVENTION: CATH118231

## 2016-05-22 LAB — ABO/RH: ABO/RH(D): A POS

## 2016-05-22 LAB — POCT ACTIVATED CLOTTING TIME: Activated Clotting Time: 219 seconds

## 2016-05-22 LAB — BUN: BUN: 22 mg/dL — ABNORMAL HIGH (ref 6–20)

## 2016-05-22 LAB — CREATININE, SERUM
CREATININE: 1.37 mg/dL — AB (ref 0.61–1.24)
GFR calc Af Amer: 58 mL/min — ABNORMAL LOW (ref >60)
GFR, EST NON AFRICAN AMERICAN: 50 mL/min — AB (ref >60)

## 2016-05-22 LAB — GLUCOSE, CAPILLARY: Glucose-Capillary: 80 mg/dL (ref 65–99)

## 2016-05-22 SURGERY — CAROTID PTA/STENT INTERVENTION
Anesthesia: Moderate Sedation | Laterality: Right

## 2016-05-22 MED ORDER — METOPROLOL TARTRATE 5 MG/5ML IV SOLN: 2.0000 mg | INTRAVENOUS | Status: DC | PRN

## 2016-05-22 MED ORDER — AMLODIPINE BESYLATE 5 MG PO TABS: 10.0000 mg | ORAL_TABLET | Freq: Every day | ORAL | Status: DC

## 2016-05-22 MED ORDER — ONDANSETRON HCL 4 MG/2ML IJ SOLN: 4.0000 mg | Freq: Four times a day (QID) | INTRAMUSCULAR | Status: DC | PRN

## 2016-05-22 MED ORDER — FENTANYL CITRATE (PF) 100 MCG/2ML IJ SOLN
INTRAMUSCULAR | Status: DC | PRN
  Administered 2016-05-22: 25 ug via INTRAVENOUS
  Administered 2016-05-22: 50 ug via INTRAVENOUS

## 2016-05-22 MED ORDER — PANTOPRAZOLE SODIUM 40 MG IV SOLR
40.0000 mg | Freq: Every day | INTRAVENOUS | Status: DC
  Administered 2016-05-22: 40 mg via INTRAVENOUS
  Filled 2016-05-22: qty 40

## 2016-05-22 MED ORDER — HEPARIN SODIUM (PORCINE) 1000 UNIT/ML IJ SOLN
INTRAMUSCULAR | Status: AC
  Filled 2016-05-22: qty 1

## 2016-05-22 MED ORDER — ASPIRIN EC 81 MG PO TBEC
81.0000 mg | DELAYED_RELEASE_TABLET | Freq: Every day | ORAL | Status: DC
  Administered 2016-05-22: 81 mg via ORAL
  Filled 2016-05-22: qty 1

## 2016-05-22 MED ORDER — ATROPINE SULFATE 1 MG/10ML IJ SOSY
PREFILLED_SYRINGE | INTRAMUSCULAR | Status: AC
  Filled 2016-05-22: qty 10

## 2016-05-22 MED ORDER — CLOPIDOGREL BISULFATE 300 MG PO TABS
300.0000 mg | ORAL_TABLET | ORAL | Status: AC
  Administered 2016-05-22: 300 mg via ORAL

## 2016-05-22 MED ORDER — FLUTICASONE PROPIONATE 50 MCG/ACT NA SUSP
2.0000 | Freq: Every day | NASAL | Status: DC | PRN
  Filled 2016-05-22: qty 16

## 2016-05-22 MED ORDER — SODIUM CHLORIDE 0.9 % IV SOLN
INTRAVENOUS | Status: DC | PRN
  Administered 2016-05-22: 250 mL via INTRAVENOUS

## 2016-05-22 MED ORDER — GUAIFENESIN-DM 100-10 MG/5ML PO SYRP: 15.0000 mL | ORAL_SOLUTION | ORAL | Status: DC | PRN

## 2016-05-22 MED ORDER — LIDOCAINE HCL (PF) 1 % IJ SOLN
INTRAMUSCULAR | Status: AC
  Filled 2016-05-22: qty 30

## 2016-05-22 MED ORDER — ROSUVASTATIN CALCIUM 5 MG PO TABS: 5.0000 mg | ORAL_TABLET | ORAL | Status: DC

## 2016-05-22 MED ORDER — CLOPIDOGREL BISULFATE 75 MG PO TABS
ORAL_TABLET | ORAL | Status: AC
  Filled 2016-05-22: qty 4

## 2016-05-22 MED ORDER — ATROPINE SULFATE 1 MG/10ML IJ SOSY
PREFILLED_SYRINGE | INTRAMUSCULAR | Status: AC
Start: 2016-05-22 — End: 2016-05-22
  Filled 2016-05-22: qty 10

## 2016-05-22 MED ORDER — MIDAZOLAM HCL 2 MG/2ML IJ SOLN
INTRAMUSCULAR | Status: DC | PRN
  Administered 2016-05-22 (×3): 1 mg via INTRAVENOUS
  Administered 2016-05-22: 0.5 mg via INTRAVENOUS

## 2016-05-22 MED ORDER — ACETAMINOPHEN 325 MG RE SUPP
325.0000 mg | RECTAL | Status: DC | PRN
  Filled 2016-05-22: qty 2

## 2016-05-22 MED ORDER — EZETIMIBE 10 MG PO TABS
10.0000 mg | ORAL_TABLET | Freq: Every day | ORAL | Status: DC
  Administered 2016-05-22: 10 mg via ORAL
  Filled 2016-05-22 (×2): qty 1

## 2016-05-22 MED ORDER — HYDROMORPHONE HCL 1 MG/ML IJ SOLN: 1.0000 mg | Freq: Once | INTRAMUSCULAR | Status: DC

## 2016-05-22 MED ORDER — CLOPIDOGREL BISULFATE 75 MG PO TABS
75.0000 mg | ORAL_TABLET | Freq: Every day | ORAL | Status: DC
  Administered 2016-05-23: 75 mg via ORAL
  Filled 2016-05-22: qty 1

## 2016-05-22 MED ORDER — SODIUM CHLORIDE 0.9 % IV SOLN
0.0000 ug/min | INTRAVENOUS | Status: DC
  Administered 2016-05-22: 6.667 ug/min via INTRAVENOUS

## 2016-05-22 MED ORDER — IOPAMIDOL (ISOVUE-300) INJECTION 61%
INTRAVENOUS | Status: DC | PRN
  Administered 2016-05-22: 65 mL via INTRA_ARTERIAL

## 2016-05-22 MED ORDER — LOSARTAN POTASSIUM 50 MG PO TABS
50.0000 mg | ORAL_TABLET | Freq: Every day | ORAL | Status: DC
  Filled 2016-05-22: qty 1

## 2016-05-22 MED ORDER — SODIUM CHLORIDE 0.9 % IV SOLN: 500.0000 mL | Freq: Once | INTRAVENOUS | Status: DC | PRN

## 2016-05-22 MED ORDER — LOSARTAN POTASSIUM 50 MG PO TABS: 50.0000 mg | ORAL_TABLET | Freq: Every day | ORAL | Status: DC

## 2016-05-22 MED ORDER — MORPHINE SULFATE (PF) 4 MG/ML IV SOLN: 2.0000 mg | INTRAVENOUS | Status: DC | PRN

## 2016-05-22 MED ORDER — PHENYLEPHRINE HCL 10 MG/ML IJ SOLN
INTRAMUSCULAR | Status: AC
  Filled 2016-05-22: qty 2

## 2016-05-22 MED ORDER — LABETALOL HCL 5 MG/ML IV SOLN: 10.0000 mg | INTRAVENOUS | Status: DC | PRN

## 2016-05-22 MED ORDER — LIDOCAINE HCL (PF) 1 % IJ SOLN
INTRAMUSCULAR | Status: AC
  Filled 2016-05-22: qty 60

## 2016-05-22 MED ORDER — KCL IN DEXTROSE-NACL 20-5-0.9 MEQ/L-%-% IV SOLN
INTRAVENOUS | Status: DC
  Administered 2016-05-22: 1000 mL via INTRAVENOUS
  Filled 2016-05-22 (×4): qty 1000

## 2016-05-22 MED ORDER — MIDAZOLAM HCL 5 MG/5ML IJ SOLN
INTRAMUSCULAR | Status: AC
  Filled 2016-05-22: qty 5

## 2016-05-22 MED ORDER — HEPARIN SODIUM (PORCINE) 1000 UNIT/ML IJ SOLN
INTRAMUSCULAR | Status: DC | PRN
  Administered 2016-05-22: 7000 [IU] via INTRAVENOUS
  Administered 2016-05-22: 3000 [IU] via INTRAVENOUS

## 2016-05-22 MED ORDER — PHENOL 1.4 % MT LIQD
1.0000 | OROMUCOSAL | Status: DC | PRN
  Filled 2016-05-22: qty 177

## 2016-05-22 MED ORDER — ACETAMINOPHEN 325 MG PO TABS: 325.0000 mg | ORAL_TABLET | ORAL | Status: DC | PRN

## 2016-05-22 MED ORDER — GEMFIBROZIL 600 MG PO TABS
600.0000 mg | ORAL_TABLET | Freq: Two times a day (BID) | ORAL | Status: DC
  Administered 2016-05-22: 600 mg via ORAL
  Filled 2016-05-22 (×2): qty 1

## 2016-05-22 MED ORDER — DOCUSATE SODIUM 100 MG PO CAPS: 100.0000 mg | ORAL_CAPSULE | Freq: Every day | ORAL | Status: DC

## 2016-05-22 MED ORDER — ASPIRIN 81 MG PO TABS: 81.0000 mg | ORAL_TABLET | ORAL | Status: DC

## 2016-05-22 MED ORDER — FENTANYL CITRATE (PF) 100 MCG/2ML IJ SOLN
INTRAMUSCULAR | Status: AC
  Filled 2016-05-22: qty 2

## 2016-05-22 MED ORDER — CEFAZOLIN IN D5W 1 GM/50ML IV SOLN
1.0000 g | Freq: Three times a day (TID) | INTRAVENOUS | Status: AC
  Administered 2016-05-22 (×2): 1 g via INTRAVENOUS
  Filled 2016-05-22 (×2): qty 50

## 2016-05-22 MED ORDER — DOPAMINE-DEXTROSE 3.2-5 MG/ML-% IV SOLN
INTRAVENOUS | Status: AC
  Filled 2016-05-22: qty 250

## 2016-05-22 MED ORDER — ALUM & MAG HYDROXIDE-SIMETH 200-200-20 MG/5ML PO SUSP: 15.0000 mL | ORAL | Status: DC | PRN

## 2016-05-22 MED ORDER — LABETALOL HCL 5 MG/ML IV SOLN
INTRAVENOUS | Status: AC
  Filled 2016-05-22: qty 4

## 2016-05-22 MED ORDER — ATROPINE SULFATE 1 MG/ML IJ SOLN
INTRAMUSCULAR | Status: DC | PRN
  Administered 2016-05-22: 1 mg via INTRAVENOUS

## 2016-05-22 MED ORDER — HEPARIN (PORCINE) IN NACL 2-0.9 UNIT/ML-% IJ SOLN
INTRAMUSCULAR | Status: AC
  Filled 2016-05-22: qty 500

## 2016-05-22 MED ORDER — HYDRALAZINE HCL 20 MG/ML IJ SOLN: 5.0000 mg | INTRAMUSCULAR | Status: DC | PRN

## 2016-05-22 MED ORDER — STERILE WATER FOR INJECTION IJ SOLN
INTRAMUSCULAR | Status: AC
  Administered 2016-05-22: 21:00:00
  Filled 2016-05-22: qty 10

## 2016-05-22 MED ORDER — SODIUM CHLORIDE 0.9 % IV SOLN
INTRAVENOUS | Status: DC
  Administered 2016-05-22: 07:00:00 via INTRAVENOUS

## 2016-05-22 MED ORDER — OXYCODONE HCL 5 MG PO TABS: 5.0000 mg | ORAL_TABLET | ORAL | Status: DC | PRN

## 2016-05-22 SURGICAL SUPPLY — 24 items
BALLN VIATRAC 5X20X135 (BALLOONS) ×2
BALLOON VIATRAC 5X20X135 (BALLOONS) ×1 IMPLANT
CATH ANGIO 5F 100CM .035 PIG (CATHETERS) ×2 IMPLANT
CATH H1 100CM (CATHETERS) ×2 IMPLANT
DEVICE EMBOSHIELD NAV6 4.0-7.0 (WIRE) ×2 IMPLANT
DEVICE PRESTO INFLATION (MISCELLANEOUS) ×4 IMPLANT
DEVICE SAFEGUARD 24CM (GAUZE/BANDAGES/DRESSINGS) ×2 IMPLANT
DEVICE STARCLOSE SE CLOSURE (Vascular Products) ×2 IMPLANT
DEVICE TORQUE (MISCELLANEOUS) ×2 IMPLANT
FCP FG STRG 5.5XNS LF DISP (INSTRUMENTS) ×1
FORCEPS FG STRG 5.5XNS LF DISP (INSTRUMENTS) ×1 IMPLANT
FORCEPS KELLY 5.5 STR (INSTRUMENTS) ×1
GLIDEWIRE ANGLED SS 035X260CM (WIRE) ×4 IMPLANT
GUIDEWIRE AMPLATZ SS .035X260 (WIRE) ×2 IMPLANT
KIT CAROTID MANIFOLD (MISCELLANEOUS) ×2 IMPLANT
NEEDLE ENTRY 21GA 7CM ECHOTIP (NEEDLE) ×2 IMPLANT
PACK ANGIOGRAPHY (CUSTOM PROCEDURE TRAY) ×2 IMPLANT
SET INTRO CAPELLA COAXIAL (SET/KITS/TRAYS/PACK) ×2 IMPLANT
SHEATH BRITE TIP 6FRX11 (SHEATH) ×2 IMPLANT
SHEATH SHUTTLE SELECT 6F (SHEATH) ×2 IMPLANT
SHIELD RADPAD SCOOP 12X17 (MISCELLANEOUS) ×2 IMPLANT
STENT XACT CAR 10X30X136 (Permanent Stent) ×2 IMPLANT
SYR MEDRAD MARK V 150ML (SYRINGE) ×2 IMPLANT
WIRE J 3MM .035X145CM (WIRE) ×2 IMPLANT

## 2016-05-22 NOTE — Op Note (Signed)
OPERATIVE NOTE DATE: 05/22/2016  PROCEDURE: 1.  Ultrasound guidance for vascular access right femoral artery 2.  Placement of a 10 x 10 x 30 Exact stent with the use of the NAV-6 embolic protection device in the right carotid bulb; right carotid artery  PRE-OPERATIVE DIAGNOSIS: 1. 85% carotid artery stenosis. 2. Right hemispheric TIA  POST-OPERATIVE DIAGNOSIS:  Same as above  SURGEON: Katha Cabal, M.D.  ASSISTANT(S):  Leotis Pain, MD  ANESTHESIA: local/MCS  ESTIMATED BLOOD LOSS:  100 cc  CONTRAST: 65 cc  FLUORO TIME: 9.5 minutes  MODERATE CONSCIOUS SEDATION TIME:  Approximately 65 minutes using  of Versed and Fentanyl  FINDING(S): 1.   Critical right carotid artery stenosis  SPECIMEN(S):   none  INDICATIONS:   Patient is a 71 y.o. male who presents with critical right carotid artery stenosis.  The patient has lung cancer which is currently undergoing treatment he has compromised pulmonary function and therefore is at high risk for a general anesthesia he also has moderate coronary artery disease and carotid artery stenting was felt to be preferred to endarterectomy for that reason.  Risks and benefits were discussed and informed consent was obtained.   DESCRIPTION: After obtaining full informed written consent, the patient was brought back to the vascular suite and placed supine upon the table.  The patient received IV antibiotics prior to induction. Moderate conscious sedation was administered during a face to face encounter with the patient throughout the procedure with my supervision of the RN administering medicines and monitoring the patients vital signs and mental status throughout from the start of the procedure until the patient was taken to the recovery room.  After obtaining adequate anesthesia, the patient was prepped and draped in the standard fashion.   The right femoral artery was visualized with ultrasound and found to be widely patent. It was then accessed  under direct ultrasound guidance without difficulty with a Seldinger needle. A permanent image was recorded. A J-wire was placed and we then placed a 6 French sheath. The patient was then heparinized and a total of 7000 units of intravenous heparin were given and an ACT was checked to confirm successful anticoagulation. Initial ACT was 219 and an additional 3000 units of heparin was given follow-up ACT was greater than 300. A pigtail catheter was then placed into the ascending aorta. This showed a type II arch no evidence of ostial stenosis of the great vessels. I then selectively cannulated the right innominate without difficulty with a H1 catheter and advanced into the mid right common carotid artery.  Cervical and cerebral carotid angiography was then performed. There were no obvious intracranial filling defects with excellent filling of the middle cerebral there was filling of the anterior cerebral on the right as well. The carotid bifurcation demonstrated critical stenosis.  I then advanced into the external carotid artery with a Glidewire and the H1 catheter and then exchanged for the Amplatz Super Stiff wire. Over the Amplatz Super Stiff wire, a 6 [place] shuttle sheath was placed into the mid common carotid artery. I then used the NAV-6  Embolic protection device and crossed the lesion and parked this in the distal internal carotid artery at the base of the skull.  I then selected a 10 x 10 x 30 Exact stent. This was deployed across the lesion encompassing it in its entirety. A 5 x 2 length balloon was used to post dilate the stent. Only about a 35 % residual stenosis was present after angioplasty.  Completion angiogram showed normal intracranial filling without new defects. At this point I elected to terminate the procedure. The sheath was removed and StarClose closure device was deployed in the left femoral artery with excellent hemostatic result. The patient was taken to the recovery room in stable  condition having tolerated the procedure well.  COMPLICATIONS: none  CONDITION: stable  Hortencia Pilar 05/22/2016 9:26 AM   This note was created with Dragon Medical transcription system. Any errors in dictation are purely unintentional.

## 2016-05-22 NOTE — H&P (Signed)
[facility] History & Physical Update  The patient was interviewed and re-examined.  The patient's previous History and Physical has been reviewed and is unchanged.  There is no change in the plan of care. We plan to proceed with the scheduled procedure.  Hortencia Pilar, MD  05/22/2016, 7:58 AM

## 2016-05-22 NOTE — Progress Notes (Signed)
NOS note  S: sitting up in bed no c/o  Would like to eat  O:  AF  BP 121/60 Hr 60       Neuro intact  A:  S/p right carotid stenting   P:  Ok to eat and ok to stand to void       Likely home tomorrow

## 2016-05-22 NOTE — Care Management (Signed)
Elective carotid stenting.  Independent in all adls, denies issues accessing medical care, obtaining medications or with transportation.  Current with PCP.

## 2016-05-23 ENCOUNTER — Ambulatory Visit
Admission: RE | Admit: 2016-05-23 | Discharge: 2016-05-23 | Disposition: A | Discharge status: Home / Self Care | Payer: Medicare HMO | Source: Ambulatory Visit | Attending: Radiation Oncology | Admitting: Radiation Oncology

## 2016-05-23 ENCOUNTER — Encounter: Payer: Self-pay | Admitting: Vascular Surgery

## 2016-05-23 ENCOUNTER — Ambulatory Visit: Payer: Medicare HMO

## 2016-05-23 ENCOUNTER — Telehealth: Payer: Self-pay | Admitting: Cardiovascular Disease

## 2016-05-23 DIAGNOSIS — Z510 Encounter for antineoplastic radiation therapy: Secondary | ICD-10-CM | POA: Diagnosis not present

## 2016-05-23 DIAGNOSIS — R7301 Impaired fasting glucose: Secondary | ICD-10-CM | POA: Diagnosis not present

## 2016-05-23 DIAGNOSIS — C3412 Malignant neoplasm of upper lobe, left bronchus or lung: Secondary | ICD-10-CM | POA: Diagnosis not present

## 2016-05-23 DIAGNOSIS — I2510 Atherosclerotic heart disease of native coronary artery without angina pectoris: Secondary | ICD-10-CM | POA: Diagnosis not present

## 2016-05-23 DIAGNOSIS — Z7982 Long term (current) use of aspirin: Secondary | ICD-10-CM | POA: Diagnosis not present

## 2016-05-23 DIAGNOSIS — J8410 Pulmonary fibrosis, unspecified: Secondary | ICD-10-CM | POA: Diagnosis not present

## 2016-05-23 DIAGNOSIS — J439 Emphysema, unspecified: Secondary | ICD-10-CM | POA: Diagnosis not present

## 2016-05-23 DIAGNOSIS — R0602 Shortness of breath: Secondary | ICD-10-CM | POA: Diagnosis not present

## 2016-05-23 DIAGNOSIS — M818 Other osteoporosis without current pathological fracture: Secondary | ICD-10-CM | POA: Diagnosis not present

## 2016-05-23 DIAGNOSIS — E785 Hyperlipidemia, unspecified: Secondary | ICD-10-CM | POA: Diagnosis not present

## 2016-05-23 LAB — CBC
HEMATOCRIT: 35.3 % — AB (ref 40.0–52.0)
Hemoglobin: 12.3 g/dL — ABNORMAL LOW (ref 13.0–18.0)
MCH: 31.7 pg (ref 26.0–34.0)
MCHC: 34.9 g/dL (ref 32.0–36.0)
MCV: 90.7 fL (ref 80.0–100.0)
Platelets: 123 10*3/uL — ABNORMAL LOW (ref 150–440)
RBC: 3.89 MIL/uL — ABNORMAL LOW (ref 4.40–5.90)
RDW: 14.3 % (ref 11.5–14.5)
WBC: 6.5 10*3/uL (ref 3.8–10.6)

## 2016-05-23 LAB — BASIC METABOLIC PANEL
Anion gap: 6 (ref 5–15)
BUN: 14 mg/dL (ref 6–20)
CHLORIDE: 106 mmol/L (ref 101–111)
CO2: 23 mmol/L (ref 22–32)
CREATININE: 1.27 mg/dL — AB (ref 0.61–1.24)
Calcium: 8.5 mg/dL — ABNORMAL LOW (ref 8.9–10.3)
GFR calc Af Amer: >60 mL/min (ref >60)
GFR calc non Af Amer: 55 mL/min — ABNORMAL LOW (ref >60)
Glucose, Bld: 104 mg/dL — ABNORMAL HIGH (ref 65–99)
Potassium: 3.9 mmol/L (ref 3.5–5.1)
Sodium: 135 mmol/L (ref 135–145)

## 2016-05-23 MED ORDER — PANTOPRAZOLE SODIUM 40 MG PO TBEC: 40.0000 mg | DELAYED_RELEASE_TABLET | Freq: Every day | ORAL | Status: DC

## 2016-05-23 MED ORDER — CLOPIDOGREL BISULFATE 75 MG PO TABS: 75.0000 mg | ORAL_TABLET | Freq: Every day | ORAL | 6 refills | Status: DC

## 2016-05-23 NOTE — Discharge Summary (Signed)
[facility]    Discharge Summary    Patient ID:  Marcus Cortez MRN: [id number] DOB/AGE: [birth year] 71 y.o.  Admit date: 05/22/2016 Discharge date: 05/23/2016 Date of Surgery: 05/22/2016 Surgeon: Surgeon(s): Katha Cabal, MD Algernon Huxley, MD  Admission Diagnosis: Carotid stenosis, symptomatic w/o infarct, right [I65.21]  Discharge Diagnoses:  Carotid stenosis, symptomatic w/o infarct, right [I65.21]  Secondary Diagnoses: Past Medical History:  Diagnosis Date  . Allergy   . Cancer ([place]) 03/2016   lung  . Carotid artery bruit   . Emphysema lung ([place])   . Emphysema of lung ([place])   . Former smoker   . Heart murmur   . Hyperlipidemia   . Hypertension   . IFG (impaired fasting glucose)   . Osteoporosis   . Pulmonary stenosis     Procedure(s): Carotid PTA/Stent Intervention  Discharged Condition: good  HPI:  The patient presented for treatment of a symptomatic right critical carotid stenosis. Yesterday he underwent successful carotid artery stenting. He has done well overnight. Blood pressures been stable neurological function has been normal groin is clean dry and intact. He is felt fit for discharge.  Hospital Course:  Marcus Cortez is a 71 y.o. male is S/P Right Procedure(s): Carotid PTA/Stent Intervention Extubated: POD # 0 Physical exam: Right groin clean dry and intact neuro stable Post-op wounds clean, dry, intact or healing well Pt. Ambulating, voiding and taking PO diet without difficulty. Pt pain controlled with PO pain meds. Labs as below Complications:none  Consults:    Significant Diagnostic Studies: CBC Lab Results  Component Value Date   WBC 6.5 05/23/2016   HGB 12.3 (L) 05/23/2016   HCT 35.3 (L) 05/23/2016   MCV 90.7 05/23/2016   PLT 123 (L) 05/23/2016    BMET    Component Value Date/Time   NA 135 05/23/2016 0421   NA 140 01/31/2016 0836   K 3.9 05/23/2016 0421   CL 106 05/23/2016 0421   CO2 23  05/23/2016 0421   GLUCOSE 104 (H) 05/23/2016 0421   BUN 14 05/23/2016 0421   BUN 12 01/31/2016 0836   CREATININE 1.27 (H) 05/23/2016 0421   CALCIUM 8.5 (L) 05/23/2016 0421   GFRNONAA 55 (L) 05/23/2016 0421   GFRAA >60 05/23/2016 0421   COAG Lab Results  Component Value Date   INR 1.02 05/21/2016   INR 1.00 04/23/2016     Disposition:  Discharge to :Home Discharge Instructions    Call MD for:  redness, tenderness, or signs of infection (pain, swelling, bleeding, redness, odor or green/yellow discharge around incision site)    Complete by:  As directed    Call MD for:  severe or increased pain, loss or decreased feeling  in affected limb(s)    Complete by:  As directed    Call MD for:  temperature >100.5    Complete by:  As directed    Discharge instructions    Complete by:  As directed    Okay to shower   Driving Restrictions    Complete by:  As directed    No driving for 1 week   Lifting restrictions    Complete by:  As directed    No lifting greater than 15 pounds 2 weeks   Resume previous diet    Complete by:  As directed      Allergies as of 05/23/2016   No Known Allergies     Medication List    TAKE these medications  acetaminophen 325 MG tablet Commonly known as:  TYLENOL Take 325-650 mg by mouth every 6 (six) hours as needed (for pain/headache.).   amLODipine 10 MG tablet Commonly known as:  NORVASC Take 1 tablet (10 mg total) by mouth daily.   aspirin 81 MG tablet Take 81 mg by mouth See admin instructions. TAKE 3-4 TIMES A WEEK   clopidogrel 75 MG tablet Commonly known as:  PLAVIX Take 1 tablet (75 mg total) by mouth daily with breakfast. Start taking on:  05/24/2016   ezetimibe 10 MG tablet Commonly known as:  ZETIA Take 1 tablet (10 mg total) by mouth daily.   fluticasone 50 MCG/ACT nasal spray Commonly known as:  FLONASE Place 2 sprays into both nostrils daily as needed for allergies or rhinitis.   gemfibrozil 600 MG tablet Commonly  known as:  LOPID Take 600 mg by mouth 2 (two) times daily.   losartan 100 MG tablet Commonly known as:  COZAAR Take 1 tablet (100 mg total) by mouth daily. What changed:  how much to take  additional instructions   naproxen sodium 220 MG tablet Commonly known as:  ANAPROX Take 220 mg by mouth 2 (two) times daily as needed (pain).   Probiotic Caps Take 1 capsule by mouth See admin instructions. Take 2-3 times a week.   rosuvastatin 5 MG tablet Commonly known as:  CRESTOR Take 1 tablet (5 mg total) by mouth as directed. Take 1 tablet twice a  week      Verbal and written Discharge instructions given to the patient. Wound care per Discharge AVS Follow-up Information    KIMBERLY A STEGMAYER, PA-C Follow up in 2 week(s).   Specialty:  Physician Assistant Contact information: [place] [address] [phone]           Signed: Hortencia Pilar, MD  05/23/2016, 9:04 AM

## 2016-05-23 NOTE — Plan of Care (Signed)
Problem: Health Behavior/Discharge Planning: Goal: Ability to manage health-related needs will improve Outcome: Completed/Met Date Met: 05/23/16 Pt had education provided verbally and in writing on self care after discharge. Pt verbalized understanding and could teach back important post discharge instructions, ie no driving or lifting over 15 pounds.

## 2016-05-23 NOTE — Plan of Care (Signed)
Problem: Education: Goal: Understanding of discharge needs will improve Outcome: Adequate for Discharge Pt verbalized and provided teach back to this nurse on post discharge care for incision site and restrictions.   Problem: Respiratory: Goal: Ability to achieve and maintain a regular respiratory rate will improve Outcome: Adequate for Discharge Pt is able to tolerate room air with no distress and o2 saturation over 90 %.  Problem: Skin Integrity: Goal: Demonstration of wound healing without infection will improve Outcome: Adequate for Discharge Pt verbalizes understanding of caring for wound site. And what complications to look for. Pt verbalizes understanding of when to call the doctor and when to come to the ER.

## 2016-05-23 NOTE — Progress Notes (Signed)
CONCERNING: IV to Oral Route Change Policy  RECOMMENDATION: This patient is receiving pantoprazole by the intravenous route.  Based on criteria approved by the Pharmacy and Therapeutics Committee, the intravenous medication(s) is/are being converted to the equivalent oral dose form(s).   DESCRIPTION: These criteria include:  The patient is eating (either orally or via tube) and/or has been taking other orally administered medications for a least 24 hours  The patient has no evidence of active gastrointestinal bleeding or impaired GI absorption (gastrectomy, short bowel, patient on TNA or NPO).  If you have questions about this conversion, please contact the Pharmacy Department  '[]'$   [phone] )  [facility] '[x]'$   [phone] )  [facility] '[]'$   [phone] )  [facility] '[]'$   [phone] )  [facility] '[]'$   [phone] )  [place], [facility] 05/23/2016 8:41 AM

## 2016-05-23 NOTE — Telephone Encounter (Signed)
Spoke with patients daughter and she wanted to know if patient needed to be on both crestor and zetia. Reviewed patients chart and they did order both of these and then to have lipid panel rechecked in 2 months. She verbalized understanding and had no further questions at this time.

## 2016-05-23 NOTE — Progress Notes (Signed)
Pt discharged in stable condition. No complaints of pain. Pt transported by this RN to cancer center. Pt daughters with patient. Pt transported with all belongings.

## 2016-05-23 NOTE — Telephone Encounter (Signed)
Pt daughter would like to know if pt is supposed to be taking Crestor, Zetia, and Lopid. Please call and advise.

## 2016-05-23 NOTE — Plan of Care (Signed)
Problem: Education: Goal: Knowledge of [facility] Education information/materials will improve Outcome: Completed/Met Date Met: 05/23/16 Pt provided with welcome packet. Pt and family were oriented to the unit. All questions answered.   Problem: Pain Managment: Goal: General experience of comfort will improve Outcome: Completed/Met Date Met: 05/23/16 Pt denies pain at this time.

## 2016-05-24 ENCOUNTER — Ambulatory Visit
Admission: RE | Admit: 2016-05-24 | Discharge: 2016-05-24 | Disposition: A | Discharge status: Home / Self Care | Payer: Medicare HMO | Source: Ambulatory Visit | Attending: Radiation Oncology | Admitting: Radiation Oncology

## 2016-05-24 DIAGNOSIS — Z7982 Long term (current) use of aspirin: Secondary | ICD-10-CM | POA: Diagnosis not present

## 2016-05-24 DIAGNOSIS — I2510 Atherosclerotic heart disease of native coronary artery without angina pectoris: Secondary | ICD-10-CM | POA: Diagnosis not present

## 2016-05-24 DIAGNOSIS — Z87891 Personal history of nicotine dependence: Secondary | ICD-10-CM | POA: Diagnosis not present

## 2016-05-24 DIAGNOSIS — E785 Hyperlipidemia, unspecified: Secondary | ICD-10-CM | POA: Diagnosis not present

## 2016-05-24 DIAGNOSIS — R7301 Impaired fasting glucose: Secondary | ICD-10-CM | POA: Diagnosis not present

## 2016-05-24 DIAGNOSIS — Z809 Family history of malignant neoplasm, unspecified: Secondary | ICD-10-CM | POA: Diagnosis not present

## 2016-05-24 DIAGNOSIS — M818 Other osteoporosis without current pathological fracture: Secondary | ICD-10-CM | POA: Diagnosis not present

## 2016-05-24 DIAGNOSIS — J8410 Pulmonary fibrosis, unspecified: Secondary | ICD-10-CM | POA: Diagnosis not present

## 2016-05-24 DIAGNOSIS — Z79899 Other long term (current) drug therapy: Secondary | ICD-10-CM | POA: Diagnosis not present

## 2016-05-24 DIAGNOSIS — C3412 Malignant neoplasm of upper lobe, left bronchus or lung: Secondary | ICD-10-CM | POA: Diagnosis not present

## 2016-05-24 DIAGNOSIS — J439 Emphysema, unspecified: Secondary | ICD-10-CM | POA: Diagnosis not present

## 2016-05-24 DIAGNOSIS — Z510 Encounter for antineoplastic radiation therapy: Secondary | ICD-10-CM | POA: Diagnosis not present

## 2016-05-24 DIAGNOSIS — R0602 Shortness of breath: Secondary | ICD-10-CM | POA: Diagnosis not present

## 2016-05-27 ENCOUNTER — Ambulatory Visit: Payer: Medicare HMO | Admitting: Pulmonary Disease

## 2016-05-27 ENCOUNTER — Ambulatory Visit
Admission: RE | Admit: 2016-05-27 | Discharge: 2016-05-27 | Disposition: A | Discharge status: Home / Self Care | Payer: Medicare HMO | Source: Ambulatory Visit | Attending: Radiation Oncology | Admitting: Radiation Oncology

## 2016-05-27 DIAGNOSIS — I2510 Atherosclerotic heart disease of native coronary artery without angina pectoris: Secondary | ICD-10-CM | POA: Diagnosis not present

## 2016-05-27 DIAGNOSIS — Z7982 Long term (current) use of aspirin: Secondary | ICD-10-CM | POA: Diagnosis not present

## 2016-05-27 DIAGNOSIS — E785 Hyperlipidemia, unspecified: Secondary | ICD-10-CM | POA: Diagnosis not present

## 2016-05-27 DIAGNOSIS — J439 Emphysema, unspecified: Secondary | ICD-10-CM | POA: Diagnosis not present

## 2016-05-27 DIAGNOSIS — R0602 Shortness of breath: Secondary | ICD-10-CM | POA: Diagnosis not present

## 2016-05-27 DIAGNOSIS — R7301 Impaired fasting glucose: Secondary | ICD-10-CM | POA: Diagnosis not present

## 2016-05-27 DIAGNOSIS — M818 Other osteoporosis without current pathological fracture: Secondary | ICD-10-CM | POA: Diagnosis not present

## 2016-05-27 DIAGNOSIS — J8410 Pulmonary fibrosis, unspecified: Secondary | ICD-10-CM | POA: Diagnosis not present

## 2016-05-27 DIAGNOSIS — Z510 Encounter for antineoplastic radiation therapy: Secondary | ICD-10-CM | POA: Diagnosis not present

## 2016-05-27 DIAGNOSIS — C3412 Malignant neoplasm of upper lobe, left bronchus or lung: Secondary | ICD-10-CM | POA: Diagnosis not present

## 2016-05-28 ENCOUNTER — Inpatient Hospital Stay: Payer: Medicare HMO

## 2016-05-28 ENCOUNTER — Ambulatory Visit
Admission: RE | Admit: 2016-05-28 | Discharge: 2016-05-28 | Disposition: A | Discharge status: Home / Self Care | Payer: Medicare HMO | Source: Ambulatory Visit | Attending: Radiation Oncology | Admitting: Radiation Oncology

## 2016-05-28 DIAGNOSIS — Z77090 Contact with and (suspected) exposure to asbestos: Secondary | ICD-10-CM | POA: Insufficient documentation

## 2016-05-28 DIAGNOSIS — I6521 Occlusion and stenosis of right carotid artery: Secondary | ICD-10-CM | POA: Insufficient documentation

## 2016-05-28 DIAGNOSIS — E785 Hyperlipidemia, unspecified: Secondary | ICD-10-CM | POA: Insufficient documentation

## 2016-05-28 DIAGNOSIS — Z5111 Encounter for antineoplastic chemotherapy: Secondary | ICD-10-CM | POA: Insufficient documentation

## 2016-05-28 DIAGNOSIS — Z510 Encounter for antineoplastic radiation therapy: Secondary | ICD-10-CM | POA: Diagnosis not present

## 2016-05-28 DIAGNOSIS — Z87891 Personal history of nicotine dependence: Secondary | ICD-10-CM | POA: Insufficient documentation

## 2016-05-28 DIAGNOSIS — J8410 Pulmonary fibrosis, unspecified: Secondary | ICD-10-CM | POA: Diagnosis not present

## 2016-05-28 DIAGNOSIS — Z7902 Long term (current) use of antithrombotics/antiplatelets: Secondary | ICD-10-CM | POA: Insufficient documentation

## 2016-05-28 DIAGNOSIS — I2510 Atherosclerotic heart disease of native coronary artery without angina pectoris: Secondary | ICD-10-CM | POA: Diagnosis not present

## 2016-05-28 DIAGNOSIS — N183 Chronic kidney disease, stage 3 (moderate): Secondary | ICD-10-CM | POA: Insufficient documentation

## 2016-05-28 DIAGNOSIS — I129 Hypertensive chronic kidney disease with stage 1 through stage 4 chronic kidney disease, or unspecified chronic kidney disease: Secondary | ICD-10-CM | POA: Insufficient documentation

## 2016-05-28 DIAGNOSIS — Z809 Family history of malignant neoplasm, unspecified: Secondary | ICD-10-CM | POA: Insufficient documentation

## 2016-05-28 DIAGNOSIS — C3412 Malignant neoplasm of upper lobe, left bronchus or lung: Secondary | ICD-10-CM | POA: Insufficient documentation

## 2016-05-28 DIAGNOSIS — J439 Emphysema, unspecified: Secondary | ICD-10-CM | POA: Insufficient documentation

## 2016-05-28 DIAGNOSIS — R7301 Impaired fasting glucose: Secondary | ICD-10-CM | POA: Diagnosis not present

## 2016-05-28 DIAGNOSIS — D696 Thrombocytopenia, unspecified: Secondary | ICD-10-CM | POA: Insufficient documentation

## 2016-05-28 DIAGNOSIS — R0989 Other specified symptoms and signs involving the circulatory and respiratory systems: Secondary | ICD-10-CM | POA: Insufficient documentation

## 2016-05-28 DIAGNOSIS — Z79899 Other long term (current) drug therapy: Secondary | ICD-10-CM | POA: Insufficient documentation

## 2016-05-28 DIAGNOSIS — R0602 Shortness of breath: Secondary | ICD-10-CM | POA: Diagnosis not present

## 2016-05-28 DIAGNOSIS — I700 Atherosclerosis of aorta: Secondary | ICD-10-CM | POA: Insufficient documentation

## 2016-05-28 DIAGNOSIS — M818 Other osteoporosis without current pathological fracture: Secondary | ICD-10-CM | POA: Diagnosis not present

## 2016-05-28 DIAGNOSIS — Z7982 Long term (current) use of aspirin: Secondary | ICD-10-CM | POA: Diagnosis not present

## 2016-05-28 DIAGNOSIS — I708 Atherosclerosis of other arteries: Secondary | ICD-10-CM | POA: Insufficient documentation

## 2016-05-29 ENCOUNTER — Encounter: Payer: Self-pay | Admitting: *Deleted

## 2016-05-29 ENCOUNTER — Inpatient Hospital Stay: Payer: Medicare HMO | Attending: Internal Medicine | Admitting: Internal Medicine

## 2016-05-29 ENCOUNTER — Inpatient Hospital Stay: Payer: Medicare HMO

## 2016-05-29 ENCOUNTER — Ambulatory Visit
Admission: RE | Admit: 2016-05-29 | Discharge: 2016-05-29 | Disposition: A | Discharge status: Home / Self Care | Payer: Medicare HMO | Source: Ambulatory Visit | Attending: Radiation Oncology | Admitting: Radiation Oncology

## 2016-05-29 ENCOUNTER — Telehealth: Payer: Self-pay | Admitting: *Deleted

## 2016-05-29 VITALS — BP 121/69 | HR 77 | Resp 20

## 2016-05-29 VITALS — BP 105/66 | HR 71 | Temp 97.1°F | Resp 18 | Wt 199.2 lb

## 2016-05-29 DIAGNOSIS — C3412 Malignant neoplasm of upper lobe, left bronchus or lung: Secondary | ICD-10-CM

## 2016-05-29 DIAGNOSIS — D696 Thrombocytopenia, unspecified: Secondary | ICD-10-CM | POA: Diagnosis not present

## 2016-05-29 DIAGNOSIS — J439 Emphysema, unspecified: Secondary | ICD-10-CM

## 2016-05-29 DIAGNOSIS — N183 Chronic kidney disease, stage 3 (moderate): Secondary | ICD-10-CM | POA: Diagnosis not present

## 2016-05-29 DIAGNOSIS — R0989 Other specified symptoms and signs involving the circulatory and respiratory systems: Secondary | ICD-10-CM

## 2016-05-29 DIAGNOSIS — I708 Atherosclerosis of other arteries: Secondary | ICD-10-CM | POA: Diagnosis not present

## 2016-05-29 DIAGNOSIS — E785 Hyperlipidemia, unspecified: Secondary | ICD-10-CM

## 2016-05-29 DIAGNOSIS — I700 Atherosclerosis of aorta: Secondary | ICD-10-CM

## 2016-05-29 DIAGNOSIS — Z7902 Long term (current) use of antithrombotics/antiplatelets: Secondary | ICD-10-CM | POA: Diagnosis not present

## 2016-05-29 DIAGNOSIS — Z87891 Personal history of nicotine dependence: Secondary | ICD-10-CM

## 2016-05-29 DIAGNOSIS — I2510 Atherosclerotic heart disease of native coronary artery without angina pectoris: Secondary | ICD-10-CM | POA: Diagnosis not present

## 2016-05-29 DIAGNOSIS — Z7982 Long term (current) use of aspirin: Secondary | ICD-10-CM

## 2016-05-29 DIAGNOSIS — R0602 Shortness of breath: Secondary | ICD-10-CM | POA: Diagnosis not present

## 2016-05-29 DIAGNOSIS — Z77090 Contact with and (suspected) exposure to asbestos: Secondary | ICD-10-CM | POA: Diagnosis not present

## 2016-05-29 DIAGNOSIS — Z809 Family history of malignant neoplasm, unspecified: Secondary | ICD-10-CM

## 2016-05-29 DIAGNOSIS — M818 Other osteoporosis without current pathological fracture: Secondary | ICD-10-CM | POA: Diagnosis not present

## 2016-05-29 DIAGNOSIS — I129 Hypertensive chronic kidney disease with stage 1 through stage 4 chronic kidney disease, or unspecified chronic kidney disease: Secondary | ICD-10-CM

## 2016-05-29 DIAGNOSIS — Z79899 Other long term (current) drug therapy: Secondary | ICD-10-CM | POA: Diagnosis not present

## 2016-05-29 DIAGNOSIS — I6521 Occlusion and stenosis of right carotid artery: Secondary | ICD-10-CM

## 2016-05-29 DIAGNOSIS — Z510 Encounter for antineoplastic radiation therapy: Secondary | ICD-10-CM | POA: Diagnosis not present

## 2016-05-29 DIAGNOSIS — Z5111 Encounter for antineoplastic chemotherapy: Secondary | ICD-10-CM | POA: Diagnosis present

## 2016-05-29 DIAGNOSIS — R7301 Impaired fasting glucose: Secondary | ICD-10-CM | POA: Diagnosis not present

## 2016-05-29 DIAGNOSIS — J8410 Pulmonary fibrosis, unspecified: Secondary | ICD-10-CM | POA: Diagnosis not present

## 2016-05-29 LAB — CBC WITH DIFFERENTIAL/PLATELET
BASOS ABS: 0 10*3/uL (ref 0–0.1)
BASOS PCT: 1 %
Eosinophils Absolute: 0.1 10*3/uL (ref 0–0.7)
Eosinophils Relative: 2 %
HEMATOCRIT: 34.8 % — AB (ref 40.0–52.0)
HEMOGLOBIN: 12.4 g/dL — AB (ref 13.0–18.0)
LYMPHS PCT: 18 %
Lymphs Abs: 1 10*3/uL (ref 1.0–3.6)
MCH: 31.6 pg (ref 26.0–34.0)
MCHC: 35.5 g/dL (ref 32.0–36.0)
MCV: 88.9 fL (ref 80.0–100.0)
MONOS PCT: 7 %
Monocytes Absolute: 0.4 10*3/uL (ref 0.2–1.0)
NEUTROS ABS: 4.1 10*3/uL (ref 1.4–6.5)
NEUTROS PCT: 72 %
Platelets: 132 10*3/uL — ABNORMAL LOW (ref 150–440)
RBC: 3.92 MIL/uL — ABNORMAL LOW (ref 4.40–5.90)
RDW: 14.6 % — ABNORMAL HIGH (ref 11.5–14.5)
WBC: 5.8 10*3/uL (ref 3.8–10.6)

## 2016-05-29 LAB — BASIC METABOLIC PANEL
ANION GAP: 9 (ref 5–15)
BUN: 26 mg/dL — AB (ref 6–20)
CHLORIDE: 103 mmol/L (ref 101–111)
CO2: 24 mmol/L (ref 22–32)
Calcium: 9.2 mg/dL (ref 8.9–10.3)
Creatinine, Ser: 1.6 mg/dL — ABNORMAL HIGH (ref 0.61–1.24)
GFR calc Af Amer: 48 mL/min — ABNORMAL LOW (ref >60)
GFR calc non Af Amer: 41 mL/min — ABNORMAL LOW (ref >60)
Glucose, Bld: 106 mg/dL — ABNORMAL HIGH (ref 65–99)
POTASSIUM: 4 mmol/L (ref 3.5–5.1)
SODIUM: 136 mmol/L (ref 135–145)

## 2016-05-29 MED ORDER — PROCHLORPERAZINE MALEATE 10 MG PO TABS: 10.0000 mg | ORAL_TABLET | Freq: Four times a day (QID) | ORAL | 1 refills | Status: DC | PRN

## 2016-05-29 MED ORDER — ONDANSETRON HCL 8 MG PO TABS: 8.0000 mg | ORAL_TABLET | Freq: Three times a day (TID) | ORAL | 1 refills | Status: DC | PRN

## 2016-05-29 MED ORDER — SODIUM CHLORIDE 0.9 % IV SOLN
45.0000 mg/m2 | Freq: Once | INTRAVENOUS | Status: AC
  Administered 2016-05-29: 96 mg via INTRAVENOUS
  Filled 2016-05-29: qty 16

## 2016-05-29 MED ORDER — SODIUM CHLORIDE 0.9 % IV SOLN
20.0000 mg | Freq: Once | INTRAVENOUS | Status: AC
  Administered 2016-05-29: 20 mg via INTRAVENOUS
  Filled 2016-05-29: qty 2

## 2016-05-29 MED ORDER — PALONOSETRON HCL INJECTION 0.25 MG/5ML
0.2500 mg | Freq: Once | INTRAVENOUS | Status: AC
  Administered 2016-05-29: 0.25 mg via INTRAVENOUS
  Filled 2016-05-29: qty 5

## 2016-05-29 MED ORDER — SODIUM CHLORIDE 0.9 % IV SOLN
Freq: Once | INTRAVENOUS | Status: AC
  Administered 2016-05-29: 10:00:00 via INTRAVENOUS
  Filled 2016-05-29: qty 1000

## 2016-05-29 MED ORDER — SODIUM CHLORIDE 0.9 % IV SOLN
155.0000 mg | Freq: Once | INTRAVENOUS | Status: AC
  Administered 2016-05-29: 160 mg via INTRAVENOUS
  Filled 2016-05-29: qty 16

## 2016-05-29 MED ORDER — FAMOTIDINE IN NACL 20-0.9 MG/50ML-% IV SOLN
20.0000 mg | Freq: Once | INTRAVENOUS | Status: AC
  Administered 2016-05-29: 20 mg via INTRAVENOUS
  Filled 2016-05-29: qty 50

## 2016-05-29 MED ORDER — DIPHENHYDRAMINE HCL 50 MG/ML IJ SOLN
50.0000 mg | Freq: Once | INTRAMUSCULAR | Status: AC
  Administered 2016-05-29: 50 mg via INTRAVENOUS
  Filled 2016-05-29: qty 1

## 2016-05-29 NOTE — Assessment & Plan Note (Addendum)
#   Adenocarcinoma LUL- T3N0 [however non-PET avid mediastinal LN; LLL infiltrative changes]. Recommend chemoradiation definitive; not a surgical candidate. Start concurrent chemo- with RT.  # start carbo-taxol #1 today [delayed because of [planned CEA] today; Labs today reviewed;  acceptable for treatment today.   # Mild thrombocytopenia- ? Etiology; ITP vs others- monitor for now.   # CKD- Stage III- baseline creat 1.3; today; 1.6- recommend increase fluids; avoid NSAIDs.   # s/p CEA- R [on asprin/plavix]  # follow up in 2 weeks/ labs; carbo-taxol weekly chemo.

## 2016-05-29 NOTE — Progress Notes (Signed)
Patient here today for follow up.  Patient states no new concerns today  

## 2016-05-29 NOTE — Progress Notes (Signed)
[place] NOTE  Patient Care Team: Kathrine Haddock, NP as PCP - General (Nurse Practitioner) Minna Merritts, MD as Consulting Physician (Cardiology)  CHIEF COMPLAINTS/PURPOSE OF CONSULTATION: Lung mass; highly concerning for cancer.   #   Oncology History   # FEB 2018- [place] CA LUL [CT Bx] T3N0 [abutting mediastinum; Mediastinal LN on CT; Neg on PET]; NOT surgical candidate [Dr.Oaks/ Dr.Henderickson; [place]];   # Carbo-Taxol RT [[facility]]  # Mild Thrombocytopenia [130s?]  # MILD THROMBOCYTOPENIA [130-140s]; Feb 2018- MR brain-NEG  # Bil Carotid stenosis [R>90% s/p CEA on 05/22/16; L~50%; Dr.Schneir].   # MOL Testing- ROS-1;ALK/EGFR-NEG.      Primary cancer of left upper lobe of lung ([name])   04/24/2016 Initial Diagnosis    Primary cancer of left upper lobe of lung ([facility])       HISTORY OF PRESENTING ILLNESS:  Marcus Cortez 71 y.o.  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 Patient started radiation last week.   Patient in the interim underwent CEA of the right neck approximately week ago. He tolerated the procedure well. He is currently on aspirin and Plavix.   Has mild cough; improved with antitussives. This is not getting worse. Denies any difficulty swallowing.  He continues to deny any unusual shortness of breath or chest pain. Denies any hemoptysis.  Patient denies any fevers. Denies any night sweats. Denies any nausea vomiting. Denies any pain. No weight loss. No tingling or numbness.He states with a fluids.  ROS: A complete 10 point review of system is done which is negative except mentioned above in history of present illness  MEDICAL HISTORY:  Past Medical History:  Diagnosis Date  . Allergy   . Cancer ([place]) 03/2016   lung  . Carotid artery bruit   . Emphysema lung ([place])   . Emphysema of lung ([place])   . Former smoker   . Heart murmur   .  Hyperlipidemia   . Hypertension   . IFG (impaired fasting glucose)   . Osteoporosis   . Pulmonary stenosis     SURGICAL HISTORY: Past Surgical History:  Procedure Laterality Date  . CAROTID PTA/STENT INTERVENTION Right 05/22/2016   Procedure: Carotid PTA/Stent Intervention;  Surgeon: Katha Cabal, MD;  Location: [facility];  Service: Cardiovascular;  Laterality: Right;  . EYE SURGERY  2010   cataract  . 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 Previous history of smoking half a pack a day for 25-30 years. Quit approximately 2 years ago. Social History   Social History  . Marital status: Widowed    Spouse name: N/A  . Number of children: N/A  . Years of education: N/A   Occupational History  . Not on file.   Social History Main Topics  . Smoking status: Former Smoker    Packs/day: 0.50    Years: 35.00    Types: Cigarettes    Quit date: 01/10/2015  . Smokeless tobacco: Former Systems developer    Types: Chew    Quit date: 04/10/1978  . Alcohol use No  . Drug use: No  . Sexual activity: Yes   Other Topics Concern  . Not on file   Social History Narrative  . No narrative on file  FAMILY HISTORY: Family History  Problem Relation Age of Onset  . Hypertension Mother   . Thyroid disease Mother   . Glaucoma Mother   . Alzheimer's disease Father   . Stroke Maternal Grandmother   . Cancer Maternal Grandfather   . Alzheimer's disease Paternal Grandmother   . Stroke Paternal Grandfather     ALLERGIES:  has No Known Allergies.  MEDICATIONS:  Current Outpatient Prescriptions  Medication Sig Dispense Refill  . acetaminophen (TYLENOL) 325 MG tablet Take 325-650 mg by mouth every 6 (six) hours as needed (for pain/headache.).     Marland Kitchen amLODipine (NORVASC) 10 MG tablet  Take 1 tablet (10 mg total) by mouth daily. 90 tablet 3  . aspirin 81 MG tablet Take 81 mg by mouth daily. TAKE 3-4 TIMES A WEEK    . clopidogrel (PLAVIX) 75 MG tablet Take 1 tablet (75 mg total) by mouth daily with breakfast. 30 tablet 6  . ezetimibe (ZETIA) 10 MG tablet Take 1 tablet (10 mg total) by mouth daily. 30 tablet 11  . fluticasone (FLONASE) 50 MCG/ACT nasal spray Place 2 sprays into both nostrils daily as needed for allergies or rhinitis.     Marland Kitchen gemfibrozil (LOPID) 600 MG tablet Take 600 mg by mouth 2 (two) times daily.     Marland Kitchen losartan (COZAAR) 100 MG tablet Take 1 tablet (100 mg total) by mouth daily. (Patient taking differently: Take 50 mg by mouth daily. BASED ON BLOOD PRESSURE READINGS) 30 tablet 11  . naproxen sodium (ANAPROX) 220 MG tablet Take 220 mg by mouth 2 (two) times daily as needed (pain).     . Probiotic CAPS Take 1 capsule by mouth See admin instructions. Take 2-3 times a week.    . rosuvastatin (CRESTOR) 5 MG tablet Take 1 tablet (5 mg total) by mouth as directed. Take 1 tablet twice a  week 15 tablet 6  . ondansetron (ZOFRAN) 8 MG tablet Take 1 tablet (8 mg total) by mouth every 8 (eight) hours as needed for nausea or vomiting (start 3 days; after chemo). 40 tablet 1  . prochlorperazine (COMPAZINE) 10 MG tablet Take 1 tablet (10 mg total) by mouth every 6 (six) hours as needed for nausea or vomiting. 40 tablet 1   No current facility-administered medications for this visit.    Facility-Administered Medications Ordered in Other Visits  Medication Dose Route Frequency Provider Last Rate Last Dose  . CARBOplatin (PARAPLATIN) 160 mg in sodium chloride 0.9 % 250 mL chemo infusion  160 mg Intravenous Once Cammie Sickle, MD 532 mL/hr at 05/29/16 1315 160 mg at 05/29/16 1315      .  PHYSICAL EXAMINATION: ECOG PERFORMANCE STATUS: 0 - Asymptomatic  Vitals:   05/29/16 0834  BP: 105/66  Pulse: 71  Resp: 18  Temp: 97.1 F (36.2 C)   Filed Weights   05/29/16  0834  Weight: 199 lb 4 oz (90.4 kg)    GENERAL: Well-nourished well-developed; Alert, no distress and comfortable.   With His daughters.  EYES: no pallor or icterus OROPHARYNX: no thrush or ulceration; good dentition  NECK: supple, no masses felt LYMPH:  no palpable lymphadenopathy in the cervical, axillary or inguinal regions LUNGS: clear to auscultation and  No wheeze or crackles HEART/CVS: regular rate & rhythm and no murmurs; No lower extremity edema ABDOMEN: abdomen soft, non-tender and normal bowel sounds Musculoskeletal:no cyanosis of digits and no clubbing  PSYCH: alert & oriented x 3 with fluent speech NEURO: no focal motor/sensory deficits SKIN:  no rashes or significant lesions  LABORATORY DATA:  I have reviewed the data as listed Lab Results  Component Value Date   WBC 5.8 05/29/2016   HGB 12.4 (L) 05/29/2016   HCT 34.8 (L) 05/29/2016   MCV 88.9 05/29/2016   PLT 132 (L) 05/29/2016    Recent Labs  01/31/16 0836  04/12/16 1415 05/06/16 1100  05/22/16 0658 05/23/16 0421 05/29/16 0758  NA 140  < > 132* 133*  --   --  135 136  K 4.1  --  3.2* 2.9*  --   --  3.9 4.0  CL 101  --  100* 93*  --   --  106 103  CO2 22  --  24 28  --   --  23 24  GLUCOSE 97  < > 102* 94  --   --  104* 106*  BUN 12  < > 21* 23*  < > 22* 14 26*  CREATININE 1.10  < > 1.16 1.27*  --  1.37* 1.27* 1.60*  CALCIUM 9.1  --  9.1 9.4  --   --  8.5* 9.2  GFRNONAA 67  --  >60 55*  --  50* 55* 41*  GFRAA 78  --  >60 >60  --  58* >60 48*  PROT 7.8  --  8.6* 8.8*  --   --   --   --   ALBUMIN 4.6  --  4.4 4.7  --   --   --   --   AST 22  --  27 37  --   --   --   --   ALT 18  --  19 41  --   --   --   --   ALKPHOS 71  --  66 70  --   --   --   --   BILITOT 1.0  --  0.7 1.0  --   --   --   --   < > = values in this interval not displayed.  RADIOGRAPHIC STUDIES: I have personally reviewed the radiological images as listed and agreed with the findings in the report. Ct Angio Neck W Or Wo  Contrast  Result Date: 05/09/2016 CLINICAL DATA:  71 year old male with left lung cancer and headaches. Right carotid bruit on exam with suspected high-grade right ICA stenosis on Doppler ultrasound last month. Subsequent encounter. EXAM: CT ANGIOGRAPHY NECK TECHNIQUE: Multidetector CT imaging of the neck was performed using the standard protocol during bolus administration of intravenous contrast. Multiplanar CT image reconstructions and MIPs were obtained to evaluate the vascular anatomy. Carotid stenosis measurements (when applicable) are obtained utilizing NASCET criteria, using the distal internal carotid diameter as the denominator. CONTRAST:  75 mL Isovue 370 COMPARISON:  Brain MRI 05/03/2016. Carotid ultrasound 04/22/2016. PET-CT 04/09/2016 FINDINGS: Skeleton: C2 through C4 level ankylosis with severe lower cervical chronic disc and endplate degeneration. Mild thoracic scoliosis. No acute or suspicious osseous lesion identified. Visualized paranasal sinuses and mastoids are stable and well pneumatized. Upper chest: Abnormal left upper lung with heterogeneous lung mass inseparable from the left hilum. The appearance appears not significantly changed since the 03/1928 18 PET-CT. Other neck: Negative thyroid. Larynx and pharynx soft tissue contours are within normal limits, the glottis is closed. Negative parapharyngeal and retropharyngeal spaces. Sublingual space, submandibular glands and parotid glands are within normal limits. No cervical lymphadenopathy. Negative visualized posterior fossa. Aortic arch: Moderate soft and calcified aortic arch atherosclerosis. Three vessel arch configuration.  Right carotid system: No hemodynamically significant brachiocephalic or right CCA origin stenosis despite soft and calcified plaque. Mildly tortuous right CCA. Bulky combined soft and calcified atherosclerosis at the posterior right carotid bifurcation results in stenosis numerically estimated at 65-70% with respect to  the post bulb all are right ICA. See series 6, image 109 and series 10, image 74). This same plaque continues along the medial surface of the right ICA bulb, but there is no additional hemodynamically significant stenosis of the cervical right ICA. The visible right ICA siphon is patent with mild to moderate calcified plaque. Left carotid system: No left CCA origin stenosis despite calcified plaque. Nearly circumferential soft and calcified plaque at the left carotid bifurcation, but not hemodynamically significant as seen on series 6, images 107-103. Mild extension of mostly calcified plaque into the left ICA bulb. Negative cervical left ICA otherwise. Visible left ICA siphon is patent with moderate calcified plaque. Vertebral arteries: Proximal right subclavian artery stenosis best demonstrated on series 8, image 88. This is numerically estimated at 70 % with respect to the distal vessel. There is soft and calcified plaque at the right vertebral artery origin with moderate to severe origin stenosis (series 8, image 100). The right vertebral otherwise is normal to the skullbase. The right V4 segment tapers distal to the patent right PICA origin but the right vertebral remains patent to the vertebrobasilar junction. The visible basilar artery is tortuous but otherwise normal. Proximal left subclavian artery stenosis estimated at 70 % with respect to the distal vessel (series 8, image 90). Moderate stenosis at the left vertebral artery origin related to soft and calcified plaque (series 8, image 118). The left vertebral artery is mildly dominant and tortuous but otherwise negative to the skullbase. No distal left vertebral artery stenosis. Small left PICA origin is patent. IMPRESSION: 1. Bulky soft and calcified plaque at the right carotid bifurcation with hemodynamically significant stenosis numerically estimated at up to 70%. Soft and calcified plaque at the left carotid bifurcation without hemodynamically  significant stenosis. 2. Similar approximately 70% stenosis of both proximal subclavian arteries. Mildly dominant left vertebral artery with moderate origin stenosis. Mildly non dominant right vertebral artery with moderate to severe origin stenosis. 3. Abnormal left upper lung appears stable since the January PET-CT. No metastatic disease identified in the neck. These results will be called to the ordering clinician or representative by the Radiology Department at the imaging location. Electronically Signed   By: Genevie Ann M.D.   On: 05/09/2016 12:32   [name] DV Contrast  Result Date: 05/03/2016 CLINICAL DATA:  Lung cancer and headaches. Evaluate for metastatic disease. EXAM: MRI HEAD WITHOUT AND WITH CONTRAST TECHNIQUE: Multiplanar, multiecho pulse sequences of the brain and surrounding structures were obtained without and with intravenous contrast. CONTRAST:  5m MULTIHANCE GADOBENATE DIMEGLUMINE 529 MG/ML IV SOLN COMPARISON:  None. FINDINGS: Brain: No evidence of metastatic disease. No acute infarction, hemorrhage, hydrocephalus, extra-axial collection or mass lesion. Vascular: Normal flow voids. Skull and upper cervical spine: Normal marrow signal. Sinuses/Orbits: Bilateral cataract resection.  Negative for mass. IMPRESSION: Negative for metastatic disease. Electronically Signed   By: JMonte FantasiaM.D.   On: 05/03/2016 11:22    ASSESSMENT & PLAN:   Primary cancer of left upper lobe of lung ([place] # Adenocarcinoma LUL- T3N0 [however non-PET avid mediastinal LN; LLL infiltrative changes]. Recommend chemoradiation definitive; not a surgical candidate. Start concurrent chemo- with RT.  # start carbo-taxol #1 today [delayed because of [planned CEA] today; Labs  today reviewed;  acceptable for treatment today.   # Mild thrombocytopenia- ? Etiology; ITP vs others- monitor for now.   # CKD- Stage III- baseline creat 1.3; today; 1.6- recommend increase fluids; avoid NSAIDs.   # s/p CEA- R [on  asprin/plavix]  # follow up in 2 weeks/ labs; carbo-taxol weekly chemo.     Cammie Sickle, MD 05/29/2016 1:31 PM

## 2016-05-29 NOTE — Telephone Encounter (Signed)
Outgoing fax submitted for prior auth for zofran

## 2016-05-30 ENCOUNTER — Ambulatory Visit
Admission: RE | Admit: 2016-05-30 | Discharge: 2016-05-30 | Disposition: A | Discharge status: Home / Self Care | Payer: Medicare HMO | Source: Ambulatory Visit | Attending: Radiation Oncology | Admitting: Radiation Oncology

## 2016-05-30 DIAGNOSIS — I2510 Atherosclerotic heart disease of native coronary artery without angina pectoris: Secondary | ICD-10-CM | POA: Diagnosis not present

## 2016-05-30 DIAGNOSIS — Z510 Encounter for antineoplastic radiation therapy: Secondary | ICD-10-CM | POA: Diagnosis not present

## 2016-05-30 DIAGNOSIS — J439 Emphysema, unspecified: Secondary | ICD-10-CM | POA: Diagnosis not present

## 2016-05-30 DIAGNOSIS — J8410 Pulmonary fibrosis, unspecified: Secondary | ICD-10-CM | POA: Diagnosis not present

## 2016-05-30 DIAGNOSIS — E785 Hyperlipidemia, unspecified: Secondary | ICD-10-CM | POA: Diagnosis not present

## 2016-05-30 DIAGNOSIS — R0602 Shortness of breath: Secondary | ICD-10-CM | POA: Diagnosis not present

## 2016-05-30 DIAGNOSIS — M818 Other osteoporosis without current pathological fracture: Secondary | ICD-10-CM | POA: Diagnosis not present

## 2016-05-30 DIAGNOSIS — R7301 Impaired fasting glucose: Secondary | ICD-10-CM | POA: Diagnosis not present

## 2016-05-30 DIAGNOSIS — C3412 Malignant neoplasm of upper lobe, left bronchus or lung: Secondary | ICD-10-CM | POA: Diagnosis not present

## 2016-05-30 DIAGNOSIS — Z7982 Long term (current) use of aspirin: Secondary | ICD-10-CM | POA: Diagnosis not present

## 2016-05-31 ENCOUNTER — Ambulatory Visit
Admission: RE | Admit: 2016-05-31 | Discharge: 2016-05-31 | Disposition: A | Discharge status: Home / Self Care | Payer: Medicare HMO | Source: Ambulatory Visit | Attending: Radiation Oncology | Admitting: Radiation Oncology

## 2016-05-31 DIAGNOSIS — R7301 Impaired fasting glucose: Secondary | ICD-10-CM | POA: Diagnosis not present

## 2016-05-31 DIAGNOSIS — R0602 Shortness of breath: Secondary | ICD-10-CM | POA: Diagnosis not present

## 2016-05-31 DIAGNOSIS — I2510 Atherosclerotic heart disease of native coronary artery without angina pectoris: Secondary | ICD-10-CM | POA: Diagnosis not present

## 2016-05-31 DIAGNOSIS — E785 Hyperlipidemia, unspecified: Secondary | ICD-10-CM | POA: Diagnosis not present

## 2016-05-31 DIAGNOSIS — J439 Emphysema, unspecified: Secondary | ICD-10-CM | POA: Diagnosis not present

## 2016-05-31 DIAGNOSIS — M818 Other osteoporosis without current pathological fracture: Secondary | ICD-10-CM | POA: Diagnosis not present

## 2016-05-31 DIAGNOSIS — J8410 Pulmonary fibrosis, unspecified: Secondary | ICD-10-CM | POA: Diagnosis not present

## 2016-05-31 DIAGNOSIS — C3412 Malignant neoplasm of upper lobe, left bronchus or lung: Secondary | ICD-10-CM | POA: Diagnosis not present

## 2016-05-31 DIAGNOSIS — Z7982 Long term (current) use of aspirin: Secondary | ICD-10-CM | POA: Diagnosis not present

## 2016-05-31 DIAGNOSIS — Z510 Encounter for antineoplastic radiation therapy: Secondary | ICD-10-CM | POA: Diagnosis not present

## 2016-06-03 ENCOUNTER — Ambulatory Visit: Payer: Medicare HMO

## 2016-06-04 ENCOUNTER — Encounter (INDEPENDENT_AMBULATORY_CARE_PROVIDER_SITE_OTHER): Payer: Self-pay | Admitting: Vascular Surgery

## 2016-06-04 ENCOUNTER — Ambulatory Visit (INDEPENDENT_AMBULATORY_CARE_PROVIDER_SITE_OTHER): Payer: Medicare HMO | Admitting: Vascular Surgery

## 2016-06-04 ENCOUNTER — Ambulatory Visit
Admission: RE | Admit: 2016-06-04 | Discharge: 2016-06-04 | Disposition: A | Discharge status: Home / Self Care | Payer: Medicare HMO | Source: Ambulatory Visit | Attending: Radiation Oncology | Admitting: Radiation Oncology

## 2016-06-04 ENCOUNTER — Other Ambulatory Visit: Payer: Self-pay | Admitting: *Deleted

## 2016-06-04 VITALS — BP 123/68 | HR 73 | Resp 17 | Wt 206.0 lb

## 2016-06-04 DIAGNOSIS — Z510 Encounter for antineoplastic radiation therapy: Secondary | ICD-10-CM | POA: Diagnosis not present

## 2016-06-04 DIAGNOSIS — R7301 Impaired fasting glucose: Secondary | ICD-10-CM | POA: Diagnosis not present

## 2016-06-04 DIAGNOSIS — J439 Emphysema, unspecified: Secondary | ICD-10-CM | POA: Diagnosis not present

## 2016-06-04 DIAGNOSIS — I10 Essential (primary) hypertension: Secondary | ICD-10-CM

## 2016-06-04 DIAGNOSIS — Z7982 Long term (current) use of aspirin: Secondary | ICD-10-CM | POA: Diagnosis not present

## 2016-06-04 DIAGNOSIS — C3412 Malignant neoplasm of upper lobe, left bronchus or lung: Secondary | ICD-10-CM | POA: Diagnosis not present

## 2016-06-04 DIAGNOSIS — J8410 Pulmonary fibrosis, unspecified: Secondary | ICD-10-CM | POA: Diagnosis not present

## 2016-06-04 DIAGNOSIS — I6521 Occlusion and stenosis of right carotid artery: Secondary | ICD-10-CM

## 2016-06-04 DIAGNOSIS — M818 Other osteoporosis without current pathological fracture: Secondary | ICD-10-CM | POA: Diagnosis not present

## 2016-06-04 DIAGNOSIS — I2510 Atherosclerotic heart disease of native coronary artery without angina pectoris: Secondary | ICD-10-CM | POA: Diagnosis not present

## 2016-06-04 DIAGNOSIS — E781 Pure hyperglyceridemia: Secondary | ICD-10-CM

## 2016-06-04 DIAGNOSIS — R0602 Shortness of breath: Secondary | ICD-10-CM | POA: Diagnosis not present

## 2016-06-04 DIAGNOSIS — E785 Hyperlipidemia, unspecified: Secondary | ICD-10-CM | POA: Diagnosis not present

## 2016-06-04 MED ORDER — SUCRALFATE 1 G PO TABS: 1.0000 g | ORAL_TABLET | Freq: Three times a day (TID) | ORAL | 3 refills | Status: DC

## 2016-06-04 NOTE — Progress Notes (Signed)
Subjective:    Patient ID: Marcus Cortez, male    DOB: 06/12/1945, 71 y.o.   MRN: [id number] Chief Complaint  Patient presents with  . Follow-up   Patient presents for his first post-operative follow up s/p a right carotid stent placement on 05/22/16. The patient is without complaint with the exception of leaving his groin dressing in place as he was unsure when to remove it. Asking if he could drive. Asking how much weight he could lift. The patient denies experiencing Amaurosis Fugax, TIA like symptoms or focal motor deficits.    Review of Systems  Constitutional: Negative.   HENT: Negative.   Eyes: Negative.   Respiratory: Negative.   Cardiovascular: Negative.   Gastrointestinal: Negative.   Endocrine: Negative.   Genitourinary: Negative.   Musculoskeletal: Negative.   Skin: Negative.   Allergic/Immunologic: Negative.   Neurological: Negative.   Hematological: Negative.   Psychiatric/Behavioral: Negative.       Objective:   Physical Exam  Constitutional: He is oriented to person, place, and time. He appears well-developed and well-nourished. No distress.  HENT:  Head: Normocephalic and atraumatic.  Eyes: Conjunctivae are normal. Pupils are equal, round, and reactive to light.  Neck: Normal range of motion.  No carotid bruits appreciated.   Cardiovascular: Normal rate, regular rhythm, normal heart sounds and intact distal pulses.   Pulses:      Dorsalis pedis pulses are 2+ on the right side, and 2+ on the left side.       Posterior tibial pulses are 2+ on the right side, and 2+ on the left side.  Pulmonary/Chest: Effort normal.  Musculoskeletal: Normal range of motion. He exhibits no edema.  Neurological: He is alert and oriented to person, place, and time.  Skin: He is not diaphoretic.  Right Groin: Removed dressing. Skin macerated but not infected. Some ecchymosis noted. No swelling. No drainage.   Psychiatric: He has a normal mood and affect. His behavior is normal.  Judgment and thought content normal.   BP 123/68   Pulse 73   Resp 17   Wt 93.4 kg (206 lb)   BMI 27.18 kg/m   Past Medical History:  Diagnosis Date  . Allergy   . Cancer ([place]) 03/2016   lung  . Carotid artery bruit   . CINV (chemotherapy-induced nausea and vomiting)   . Emphysema lung ([place])   . Emphysema of lung ([place])   . Former smoker   . Heart murmur   . Hyperlipidemia   . Hypertension   . IFG (impaired fasting glucose)   . Osteoporosis   . Pulmonary stenosis    Social History   Social History  . Marital status: Widowed    Spouse name: N/A  . Number of children: N/A  . Years of education: N/A   Occupational History  . Not on file.   Social History Main Topics  . Smoking status: Former Smoker    Packs/day: 0.50    Years: 35.00    Types: Cigarettes    Quit date: 01/10/2015  . Smokeless tobacco: Former Systems developer    Types: Chew    Quit date: 04/10/1978  . Alcohol use No  . Drug use: No  . Sexual activity: Yes   Other Topics Concern  . Not on file   Social History Narrative  . No narrative on file   Past Surgical History:  Procedure Laterality Date  . CAROTID PTA/STENT INTERVENTION Right 05/22/2016   Procedure: Carotid PTA/Stent Intervention;  Surgeon: Belenda Cruise  Eloise Levels, MD;  Location: [facility];  Service: Cardiovascular;  Laterality: Right;  . EYE SURGERY  2010   cataract  . FLEXIBLE BRONCHOSCOPY N/A 04/10/2016   Procedure: FLEXIBLE BRONCHOSCOPY;  Surgeon: Wilhelmina Mcardle, MD;  Location: [facility];  Service: Pulmonary;  Laterality: N/A;   Family History  Problem Relation Age of Onset  . Hypertension Mother   . Thyroid disease Mother   . Glaucoma Mother   . Alzheimer's disease Father   . Stroke Maternal Grandmother   . Cancer Maternal Grandfather   . Alzheimer's disease Paternal Grandmother   . Stroke Paternal Grandfather    No Known Allergies     Assessment & Plan:  Patient presents for his first post-operative follow up s/p a right  carotid stent placement on 05/22/16. The patient is without complaint with the exception of leaving his groin dressing in place as he was unsure when to remove it. Asking if he could drive. Asking how much weight he could lift. The patient denies experiencing Amaurosis Fugax, TIA like symptoms or focal motor deficits.  1. 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  OK to shower. OK to drive. OK to lift no heavier than 10 pounds each arm. Patient to follow up in one month for carotid duplex.   - VAS US CAROTID; Future  2. Pure hyperglyceridemia - Stable Encouraged good control as its slows the progression of atherosclerotic disease.  3. Hypertension, unspecified type - Stable Encouraged good control as its slows the progression of atherosclerotic disease.  Current Outpatient Prescriptions on File Prior to Visit  Medication Sig Dispense Refill  . acetaminophen (TYLENOL) 325 MG tablet Take 325-650 mg by mouth every 6 (six) hours as needed (for pain/headache.).     Marland Kitchen amLODipine (NORVASC) 10 MG tablet Take 1 tablet (10 mg total) by mouth daily. 90 tablet 3  . aspirin 81 MG tablet Take 81 mg by mouth daily. TAKE 3-4 TIMES A WEEK    . clopidogrel (PLAVIX) 75 MG tablet Take 1 tablet (75 mg total) by mouth daily with breakfast. 30 tablet 6  . ezetimibe (ZETIA) 10 MG tablet Take 1 tablet (10 mg total) by mouth daily. 30 tablet 11  . fluticasone (FLONASE) 50 MCG/ACT nasal spray Place 2 sprays into both nostrils daily as needed for allergies or rhinitis.     Marland Kitchen gemfibrozil (LOPID) 600 MG tablet Take 600 mg by mouth 2 (two) times daily.     Marland Kitchen losartan (COZAAR) 100 MG tablet Take 1 tablet (100 mg total) by mouth daily. (Patient taking differently: Take 50 mg by mouth daily. BASED ON BLOOD PRESSURE READINGS) 30 tablet 11  . naproxen sodium (ANAPROX) 220 MG tablet Take 220 mg by mouth 2 (two) times daily as needed (pain).     . ondansetron (ZOFRAN)  8 MG tablet Take 1 tablet (8 mg total) by mouth every 8 (eight) hours as needed for nausea or vomiting (start 3 days; after chemo). 40 tablet 1  . Probiotic CAPS Take 1 capsule by mouth See admin instructions. Take 2-3 times a week.    . prochlorperazine (COMPAZINE) 10 MG tablet Take 1 tablet (10 mg total) by mouth every 6 (six) hours as needed for nausea or vomiting. 40 tablet 1  . rosuvastatin (CRESTOR) 5 MG tablet Take 1 tablet (5 mg total) by mouth as directed. Take 1 tablet twice a  week 15 tablet 6   No current facility-administered  medications on file prior to visit.    There are no Patient Instructions on file for this visit. Return if symptoms worsen or fail to improve, for One Month For Carotid Duplex.  Zaquan Duffner A Demarlo Riojas, PA-C

## 2016-06-05 ENCOUNTER — Ambulatory Visit
Admission: RE | Admit: 2016-06-05 | Discharge: 2016-06-05 | Disposition: A | Discharge status: Home / Self Care | Payer: Medicare HMO | Source: Ambulatory Visit | Attending: Radiation Oncology | Admitting: Radiation Oncology

## 2016-06-05 ENCOUNTER — Encounter: Payer: Self-pay | Admitting: *Deleted

## 2016-06-05 ENCOUNTER — Other Ambulatory Visit: Payer: Self-pay | Admitting: Internal Medicine

## 2016-06-05 ENCOUNTER — Inpatient Hospital Stay: Payer: Medicare HMO

## 2016-06-05 ENCOUNTER — Inpatient Hospital Stay: Payer: Medicare HMO | Admitting: Internal Medicine

## 2016-06-05 VITALS — BP 117/74 | HR 69 | Resp 18

## 2016-06-05 DIAGNOSIS — Z7982 Long term (current) use of aspirin: Secondary | ICD-10-CM | POA: Diagnosis not present

## 2016-06-05 DIAGNOSIS — R7301 Impaired fasting glucose: Secondary | ICD-10-CM | POA: Diagnosis not present

## 2016-06-05 DIAGNOSIS — C3412 Malignant neoplasm of upper lobe, left bronchus or lung: Secondary | ICD-10-CM

## 2016-06-05 DIAGNOSIS — J439 Emphysema, unspecified: Secondary | ICD-10-CM | POA: Diagnosis not present

## 2016-06-05 DIAGNOSIS — M818 Other osteoporosis without current pathological fracture: Secondary | ICD-10-CM | POA: Diagnosis not present

## 2016-06-05 DIAGNOSIS — R0602 Shortness of breath: Secondary | ICD-10-CM | POA: Diagnosis not present

## 2016-06-05 DIAGNOSIS — I2510 Atherosclerotic heart disease of native coronary artery without angina pectoris: Secondary | ICD-10-CM | POA: Diagnosis not present

## 2016-06-05 DIAGNOSIS — Z510 Encounter for antineoplastic radiation therapy: Secondary | ICD-10-CM | POA: Diagnosis not present

## 2016-06-05 DIAGNOSIS — J8410 Pulmonary fibrosis, unspecified: Secondary | ICD-10-CM | POA: Diagnosis not present

## 2016-06-05 DIAGNOSIS — Z5111 Encounter for antineoplastic chemotherapy: Secondary | ICD-10-CM | POA: Diagnosis not present

## 2016-06-05 DIAGNOSIS — E785 Hyperlipidemia, unspecified: Secondary | ICD-10-CM | POA: Diagnosis not present

## 2016-06-05 LAB — CBC WITH DIFFERENTIAL/PLATELET
Basophils Absolute: 0 10*3/uL (ref 0–0.1)
Basophils Relative: 1 %
EOS ABS: 0.1 10*3/uL (ref 0–0.7)
EOS PCT: 3 %
HCT: 33.9 % — ABNORMAL LOW (ref 40.0–52.0)
Hemoglobin: 11.8 g/dL — ABNORMAL LOW (ref 13.0–18.0)
LYMPHS ABS: 0.9 10*3/uL — AB (ref 1.0–3.6)
Lymphocytes Relative: 21 %
MCH: 31.6 pg (ref 26.0–34.0)
MCHC: 35 g/dL (ref 32.0–36.0)
MCV: 90.5 fL (ref 80.0–100.0)
MONO ABS: 0.3 10*3/uL (ref 0.2–1.0)
MONOS PCT: 8 %
Neutro Abs: 3 10*3/uL (ref 1.4–6.5)
Neutrophils Relative %: 67 %
PLATELETS: 143 10*3/uL — AB (ref 150–440)
RBC: 3.74 MIL/uL — ABNORMAL LOW (ref 4.40–5.90)
RDW: 14.8 % — AB (ref 11.5–14.5)
WBC: 4.4 10*3/uL (ref 3.8–10.6)

## 2016-06-05 LAB — BASIC METABOLIC PANEL
ANION GAP: 8 (ref 5–15)
BUN: 23 mg/dL — ABNORMAL HIGH (ref 6–20)
CALCIUM: 9 mg/dL (ref 8.9–10.3)
CO2: 26 mmol/L (ref 22–32)
Chloride: 104 mmol/L (ref 101–111)
Creatinine, Ser: 1.41 mg/dL — ABNORMAL HIGH (ref 0.61–1.24)
GFR, EST AFRICAN AMERICAN: 56 mL/min — AB (ref >60)
GFR, EST NON AFRICAN AMERICAN: 48 mL/min — AB (ref >60)
Glucose, Bld: 69 mg/dL (ref 65–99)
Potassium: 4 mmol/L (ref 3.5–5.1)
Sodium: 138 mmol/L (ref 135–145)

## 2016-06-05 MED ORDER — FAMOTIDINE IN NACL 20-0.9 MG/50ML-% IV SOLN
20.0000 mg | Freq: Once | INTRAVENOUS | Status: AC
  Administered 2016-06-05: 20 mg via INTRAVENOUS
  Filled 2016-06-05: qty 50

## 2016-06-05 MED ORDER — SODIUM CHLORIDE 0.9 % IV SOLN
45.0000 mg/m2 | Freq: Once | INTRAVENOUS | Status: AC
  Administered 2016-06-05: 96 mg via INTRAVENOUS
  Filled 2016-06-05: qty 16

## 2016-06-05 MED ORDER — DEXAMETHASONE SODIUM PHOSPHATE 100 MG/10ML IJ SOLN
20.0000 mg | Freq: Once | INTRAMUSCULAR | Status: AC
  Administered 2016-06-05: 20 mg via INTRAVENOUS
  Filled 2016-06-05: qty 2

## 2016-06-05 MED ORDER — DIPHENHYDRAMINE HCL 50 MG/ML IJ SOLN
50.0000 mg | Freq: Once | INTRAMUSCULAR | Status: AC
Start: 2016-06-05 — End: 2016-06-05
  Administered 2016-06-05: 50 mg via INTRAVENOUS
  Filled 2016-06-05: qty 1

## 2016-06-05 MED ORDER — SODIUM CHLORIDE 0.9 % IV SOLN
Freq: Once | INTRAVENOUS | Status: AC
  Administered 2016-06-05: 10:00:00 via INTRAVENOUS
  Filled 2016-06-05: qty 1000

## 2016-06-05 MED ORDER — CARBOPLATIN CHEMO INJECTION 450 MG/45ML
160.0000 mg | Freq: Once | INTRAVENOUS | Status: AC
  Administered 2016-06-05: 160 mg via INTRAVENOUS
  Filled 2016-06-05: qty 16

## 2016-06-05 MED ORDER — PALONOSETRON HCL INJECTION 0.25 MG/5ML
0.2500 mg | Freq: Once | INTRAVENOUS | Status: AC
  Administered 2016-06-05: 0.25 mg via INTRAVENOUS
  Filled 2016-06-05: qty 5

## 2016-06-05 NOTE — Progress Notes (Signed)
Proceed with treatment per Dr. Rogue Bussing. LJ

## 2016-06-06 ENCOUNTER — Ambulatory Visit
Admission: RE | Admit: 2016-06-06 | Discharge: 2016-06-06 | Disposition: A | Discharge status: Home / Self Care | Payer: Medicare HMO | Source: Ambulatory Visit | Attending: Radiation Oncology | Admitting: Radiation Oncology

## 2016-06-06 DIAGNOSIS — C3412 Malignant neoplasm of upper lobe, left bronchus or lung: Secondary | ICD-10-CM | POA: Diagnosis not present

## 2016-06-06 DIAGNOSIS — M818 Other osteoporosis without current pathological fracture: Secondary | ICD-10-CM | POA: Diagnosis not present

## 2016-06-06 DIAGNOSIS — Z7982 Long term (current) use of aspirin: Secondary | ICD-10-CM | POA: Diagnosis not present

## 2016-06-06 DIAGNOSIS — R0602 Shortness of breath: Secondary | ICD-10-CM | POA: Diagnosis not present

## 2016-06-06 DIAGNOSIS — Z510 Encounter for antineoplastic radiation therapy: Secondary | ICD-10-CM | POA: Diagnosis not present

## 2016-06-06 DIAGNOSIS — I2510 Atherosclerotic heart disease of native coronary artery without angina pectoris: Secondary | ICD-10-CM | POA: Diagnosis not present

## 2016-06-06 DIAGNOSIS — E785 Hyperlipidemia, unspecified: Secondary | ICD-10-CM | POA: Diagnosis not present

## 2016-06-06 DIAGNOSIS — J8410 Pulmonary fibrosis, unspecified: Secondary | ICD-10-CM | POA: Diagnosis not present

## 2016-06-06 DIAGNOSIS — J439 Emphysema, unspecified: Secondary | ICD-10-CM | POA: Diagnosis not present

## 2016-06-06 DIAGNOSIS — R7301 Impaired fasting glucose: Secondary | ICD-10-CM | POA: Diagnosis not present

## 2016-06-07 ENCOUNTER — Ambulatory Visit
Admission: RE | Admit: 2016-06-07 | Discharge: 2016-06-07 | Disposition: A | Discharge status: Home / Self Care | Payer: Medicare HMO | Source: Ambulatory Visit | Attending: Radiation Oncology | Admitting: Radiation Oncology

## 2016-06-07 DIAGNOSIS — Z7982 Long term (current) use of aspirin: Secondary | ICD-10-CM | POA: Diagnosis not present

## 2016-06-07 DIAGNOSIS — E785 Hyperlipidemia, unspecified: Secondary | ICD-10-CM | POA: Diagnosis not present

## 2016-06-07 DIAGNOSIS — M818 Other osteoporosis without current pathological fracture: Secondary | ICD-10-CM | POA: Diagnosis not present

## 2016-06-07 DIAGNOSIS — J439 Emphysema, unspecified: Secondary | ICD-10-CM | POA: Diagnosis not present

## 2016-06-07 DIAGNOSIS — R0602 Shortness of breath: Secondary | ICD-10-CM | POA: Diagnosis not present

## 2016-06-07 DIAGNOSIS — J8410 Pulmonary fibrosis, unspecified: Secondary | ICD-10-CM | POA: Diagnosis not present

## 2016-06-07 DIAGNOSIS — C3412 Malignant neoplasm of upper lobe, left bronchus or lung: Secondary | ICD-10-CM | POA: Diagnosis not present

## 2016-06-07 DIAGNOSIS — I2510 Atherosclerotic heart disease of native coronary artery without angina pectoris: Secondary | ICD-10-CM | POA: Diagnosis not present

## 2016-06-07 DIAGNOSIS — Z510 Encounter for antineoplastic radiation therapy: Secondary | ICD-10-CM | POA: Diagnosis not present

## 2016-06-07 DIAGNOSIS — R7301 Impaired fasting glucose: Secondary | ICD-10-CM | POA: Diagnosis not present

## 2016-06-10 ENCOUNTER — Ambulatory Visit
Admission: RE | Admit: 2016-06-10 | Discharge: 2016-06-10 | Disposition: A | Discharge status: Home / Self Care | Payer: Medicare HMO | Source: Ambulatory Visit | Attending: Radiation Oncology | Admitting: Radiation Oncology

## 2016-06-10 ENCOUNTER — Other Ambulatory Visit: Payer: Self-pay | Admitting: *Deleted

## 2016-06-10 ENCOUNTER — Telehealth: Payer: Self-pay | Admitting: *Deleted

## 2016-06-10 DIAGNOSIS — M818 Other osteoporosis without current pathological fracture: Secondary | ICD-10-CM | POA: Diagnosis not present

## 2016-06-10 DIAGNOSIS — R7301 Impaired fasting glucose: Secondary | ICD-10-CM | POA: Diagnosis not present

## 2016-06-10 DIAGNOSIS — I2510 Atherosclerotic heart disease of native coronary artery without angina pectoris: Secondary | ICD-10-CM | POA: Diagnosis not present

## 2016-06-10 DIAGNOSIS — E785 Hyperlipidemia, unspecified: Secondary | ICD-10-CM | POA: Diagnosis not present

## 2016-06-10 DIAGNOSIS — Z7982 Long term (current) use of aspirin: Secondary | ICD-10-CM | POA: Diagnosis not present

## 2016-06-10 DIAGNOSIS — Z510 Encounter for antineoplastic radiation therapy: Secondary | ICD-10-CM | POA: Diagnosis not present

## 2016-06-10 DIAGNOSIS — J8410 Pulmonary fibrosis, unspecified: Secondary | ICD-10-CM | POA: Diagnosis not present

## 2016-06-10 DIAGNOSIS — C3412 Malignant neoplasm of upper lobe, left bronchus or lung: Secondary | ICD-10-CM | POA: Diagnosis not present

## 2016-06-10 DIAGNOSIS — R0602 Shortness of breath: Secondary | ICD-10-CM | POA: Diagnosis not present

## 2016-06-10 DIAGNOSIS — J439 Emphysema, unspecified: Secondary | ICD-10-CM | POA: Diagnosis not present

## 2016-06-10 MED ORDER — CEPHALEXIN 500 MG PO CAPS: 500.0000 mg | ORAL_CAPSULE | Freq: Three times a day (TID) | ORAL | 0 refills | Status: DC

## 2016-06-10 MED ORDER — HYDROCOD POLST-CPM POLST ER 10-8 MG/5ML PO SUER: 5.0000 mL | Freq: Two times a day (BID) | ORAL | 0 refills | Status: DC | PRN

## 2016-06-10 NOTE — Telephone Encounter (Signed)
Per Dr Rogue Bussing, Keflex 500 mg tid times 7 days. Await Vascular input. Claiborne Billings informed and while on the phone with her, vascular office was calling, she stated she will call us back with an update

## 2016-06-10 NOTE — Telephone Encounter (Signed)
Called to report that he has been running fever up to 100.9 since Friday. His stent placement site has pus present.   They have a call in to the vascular surgeons office, but has not heard back from them yet. Please advise

## 2016-06-11 ENCOUNTER — Ambulatory Visit
Admission: RE | Admit: 2016-06-11 | Discharge: 2016-06-11 | Disposition: A | Discharge status: Home / Self Care | Payer: Medicare HMO | Source: Ambulatory Visit | Attending: Radiation Oncology | Admitting: Radiation Oncology

## 2016-06-11 ENCOUNTER — Ambulatory Visit: Payer: Medicare HMO

## 2016-06-11 DIAGNOSIS — J439 Emphysema, unspecified: Secondary | ICD-10-CM | POA: Diagnosis not present

## 2016-06-11 DIAGNOSIS — J8410 Pulmonary fibrosis, unspecified: Secondary | ICD-10-CM | POA: Diagnosis not present

## 2016-06-11 DIAGNOSIS — C3412 Malignant neoplasm of upper lobe, left bronchus or lung: Secondary | ICD-10-CM | POA: Diagnosis not present

## 2016-06-11 DIAGNOSIS — M818 Other osteoporosis without current pathological fracture: Secondary | ICD-10-CM | POA: Diagnosis not present

## 2016-06-11 DIAGNOSIS — R0602 Shortness of breath: Secondary | ICD-10-CM | POA: Diagnosis not present

## 2016-06-11 DIAGNOSIS — E785 Hyperlipidemia, unspecified: Secondary | ICD-10-CM | POA: Diagnosis not present

## 2016-06-11 DIAGNOSIS — R7301 Impaired fasting glucose: Secondary | ICD-10-CM | POA: Diagnosis not present

## 2016-06-11 DIAGNOSIS — Z510 Encounter for antineoplastic radiation therapy: Secondary | ICD-10-CM | POA: Diagnosis not present

## 2016-06-11 DIAGNOSIS — Z7982 Long term (current) use of aspirin: Secondary | ICD-10-CM | POA: Diagnosis not present

## 2016-06-11 DIAGNOSIS — I2510 Atherosclerotic heart disease of native coronary artery without angina pectoris: Secondary | ICD-10-CM | POA: Diagnosis not present

## 2016-06-12 ENCOUNTER — Ambulatory Visit
Admission: RE | Admit: 2016-06-12 | Discharge: 2016-06-12 | Disposition: A | Discharge status: Home / Self Care | Payer: Medicare HMO | Source: Ambulatory Visit | Attending: Internal Medicine | Admitting: Internal Medicine

## 2016-06-12 ENCOUNTER — Inpatient Hospital Stay (HOSPITAL_BASED_OUTPATIENT_CLINIC_OR_DEPARTMENT_OTHER): Payer: Medicare HMO | Admitting: Internal Medicine

## 2016-06-12 ENCOUNTER — Inpatient Hospital Stay: Payer: Medicare HMO | Attending: Internal Medicine

## 2016-06-12 ENCOUNTER — Inpatient Hospital Stay: Payer: Medicare HMO

## 2016-06-12 ENCOUNTER — Ambulatory Visit
Admission: RE | Admit: 2016-06-12 | Discharge: 2016-06-12 | Disposition: A | Discharge status: Home / Self Care | Payer: Medicare HMO | Source: Ambulatory Visit | Attending: Radiation Oncology | Admitting: Radiation Oncology

## 2016-06-12 VITALS — BP 122/68 | HR 81 | Temp 98.5°F | Resp 18 | Wt 204.4 lb

## 2016-06-12 DIAGNOSIS — R067 Sneezing: Secondary | ICD-10-CM | POA: Insufficient documentation

## 2016-06-12 DIAGNOSIS — N183 Chronic kidney disease, stage 3 (moderate): Secondary | ICD-10-CM | POA: Diagnosis not present

## 2016-06-12 DIAGNOSIS — B349 Viral infection, unspecified: Secondary | ICD-10-CM | POA: Diagnosis not present

## 2016-06-12 DIAGNOSIS — Z79899 Other long term (current) drug therapy: Secondary | ICD-10-CM | POA: Insufficient documentation

## 2016-06-12 DIAGNOSIS — R05 Cough: Secondary | ICD-10-CM | POA: Insufficient documentation

## 2016-06-12 DIAGNOSIS — R5383 Other fatigue: Secondary | ICD-10-CM | POA: Insufficient documentation

## 2016-06-12 DIAGNOSIS — R509 Fever, unspecified: Secondary | ICD-10-CM

## 2016-06-12 DIAGNOSIS — I119 Hypertensive heart disease without heart failure: Secondary | ICD-10-CM | POA: Diagnosis not present

## 2016-06-12 DIAGNOSIS — Z5111 Encounter for antineoplastic chemotherapy: Secondary | ICD-10-CM | POA: Insufficient documentation

## 2016-06-12 DIAGNOSIS — J439 Emphysema, unspecified: Secondary | ICD-10-CM

## 2016-06-12 DIAGNOSIS — Z7952 Long term (current) use of systemic steroids: Secondary | ICD-10-CM | POA: Diagnosis not present

## 2016-06-12 DIAGNOSIS — I517 Cardiomegaly: Secondary | ICD-10-CM | POA: Insufficient documentation

## 2016-06-12 DIAGNOSIS — R059 Cough, unspecified: Secondary | ICD-10-CM

## 2016-06-12 DIAGNOSIS — C3412 Malignant neoplasm of upper lobe, left bronchus or lung: Secondary | ICD-10-CM | POA: Insufficient documentation

## 2016-06-12 DIAGNOSIS — Z7982 Long term (current) use of aspirin: Secondary | ICD-10-CM | POA: Insufficient documentation

## 2016-06-12 DIAGNOSIS — Z510 Encounter for antineoplastic radiation therapy: Secondary | ICD-10-CM | POA: Diagnosis not present

## 2016-06-12 DIAGNOSIS — I6523 Occlusion and stenosis of bilateral carotid arteries: Secondary | ICD-10-CM | POA: Diagnosis not present

## 2016-06-12 DIAGNOSIS — R040 Epistaxis: Secondary | ICD-10-CM | POA: Diagnosis not present

## 2016-06-12 DIAGNOSIS — I700 Atherosclerosis of aorta: Secondary | ICD-10-CM | POA: Diagnosis not present

## 2016-06-12 DIAGNOSIS — I129 Hypertensive chronic kidney disease with stage 1 through stage 4 chronic kidney disease, or unspecified chronic kidney disease: Secondary | ICD-10-CM | POA: Diagnosis not present

## 2016-06-12 DIAGNOSIS — E785 Hyperlipidemia, unspecified: Secondary | ICD-10-CM

## 2016-06-12 DIAGNOSIS — J8410 Pulmonary fibrosis, unspecified: Secondary | ICD-10-CM | POA: Diagnosis not present

## 2016-06-12 DIAGNOSIS — M810 Age-related osteoporosis without current pathological fracture: Secondary | ICD-10-CM

## 2016-06-12 DIAGNOSIS — Z77090 Contact with and (suspected) exposure to asbestos: Secondary | ICD-10-CM | POA: Insufficient documentation

## 2016-06-12 DIAGNOSIS — D696 Thrombocytopenia, unspecified: Secondary | ICD-10-CM | POA: Diagnosis not present

## 2016-06-12 DIAGNOSIS — M818 Other osteoporosis without current pathological fracture: Secondary | ICD-10-CM | POA: Diagnosis not present

## 2016-06-12 DIAGNOSIS — Z87891 Personal history of nicotine dependence: Secondary | ICD-10-CM | POA: Diagnosis not present

## 2016-06-12 DIAGNOSIS — R7301 Impaired fasting glucose: Secondary | ICD-10-CM | POA: Diagnosis not present

## 2016-06-12 DIAGNOSIS — I2510 Atherosclerotic heart disease of native coronary artery without angina pectoris: Secondary | ICD-10-CM | POA: Diagnosis not present

## 2016-06-12 DIAGNOSIS — R0602 Shortness of breath: Secondary | ICD-10-CM | POA: Diagnosis not present

## 2016-06-12 LAB — URINALYSIS, COMPLETE (UACMP) WITH MICROSCOPIC
Bilirubin Urine: NEGATIVE
Glucose, UA: 150 mg/dL — AB
Hgb urine dipstick: NEGATIVE
Ketones, ur: NEGATIVE mg/dL
Leukocytes, UA: NEGATIVE
NITRITE: NEGATIVE
PROTEIN: 30 mg/dL — AB
SPECIFIC GRAVITY, URINE: 1.025 (ref 1.005–1.030)
pH: 5 (ref 5.0–8.0)

## 2016-06-12 LAB — CBC WITH DIFFERENTIAL/PLATELET
BASOS ABS: 0 10*3/uL (ref 0–0.1)
Basophils Relative: 1 %
Eosinophils Absolute: 0 10*3/uL (ref 0–0.7)
Eosinophils Relative: 1 %
HCT: 28.2 % — ABNORMAL LOW (ref 40.0–52.0)
Hemoglobin: 9.9 g/dL — ABNORMAL LOW (ref 13.0–18.0)
LYMPHS ABS: 0.4 10*3/uL — AB (ref 1.0–3.6)
LYMPHS PCT: 8 %
MCH: 32 pg (ref 26.0–34.0)
MCHC: 35 g/dL (ref 32.0–36.0)
MCV: 91.3 fL (ref 80.0–100.0)
MONO ABS: 0.6 10*3/uL (ref 0.2–1.0)
Monocytes Relative: 12 %
Neutro Abs: 4 10*3/uL (ref 1.4–6.5)
Neutrophils Relative %: 78 %
Platelets: 117 10*3/uL — ABNORMAL LOW (ref 150–440)
RBC: 3.09 MIL/uL — AB (ref 4.40–5.90)
RDW: 15.1 % — AB (ref 11.5–14.5)
WBC: 5.1 10*3/uL (ref 3.8–10.6)

## 2016-06-12 LAB — COMPREHENSIVE METABOLIC PANEL
ALT: 14 U/L — AB (ref 17–63)
AST: 18 U/L (ref 15–41)
Albumin: 3.4 g/dL — ABNORMAL LOW (ref 3.5–5.0)
Alkaline Phosphatase: 44 U/L (ref 38–126)
Anion gap: 7 (ref 5–15)
BILIRUBIN TOTAL: 0.6 mg/dL (ref 0.3–1.2)
BUN: 23 mg/dL — AB (ref 6–20)
CO2: 24 mmol/L (ref 22–32)
CREATININE: 1.31 mg/dL — AB (ref 0.61–1.24)
Calcium: 8.3 mg/dL — ABNORMAL LOW (ref 8.9–10.3)
Chloride: 104 mmol/L (ref 101–111)
GFR calc Af Amer: >60 mL/min (ref >60)
GFR, EST NON AFRICAN AMERICAN: 53 mL/min — AB (ref >60)
Glucose, Bld: 91 mg/dL (ref 65–99)
Potassium: 3.4 mmol/L — ABNORMAL LOW (ref 3.5–5.1)
Sodium: 135 mmol/L (ref 135–145)
TOTAL PROTEIN: 7.6 g/dL (ref 6.5–8.1)

## 2016-06-12 NOTE — Progress Notes (Signed)
[place] NOTE  Patient Care Team: Kathrine Haddock, NP as PCP - General (Nurse Practitioner) Minna Merritts, MD as Consulting Physician (Cardiology)  CHIEF COMPLAINTS/PURPOSE OF CONSULTATION: Lung mass; highly concerning for cancer.   #   Oncology History   # FEB 2018- [place] CA LUL [CT Bx] T3N0 [abutting mediastinum; Mediastinal LN on CT; Neg on PET]; NOT surgical candidate [Dr.Oaks/ Dr.Henderickson; [place]];   # Carbo-Taxol RT [[facility]]  # Mild Thrombocytopenia [130s?]  # MILD THROMBOCYTOPENIA [130-140s]; Feb 2018- MR brain-NEG  # Bil Carotid stenosis [R>90% s/p CEA on 05/22/16; L~50%; Dr.Schneir].   # MOL Testing- ROS-1;ALK/EGFR-NEG.      Primary cancer of left upper lobe of lung ([place])   04/24/2016 Initial Diagnosis    Primary cancer of left upper lobe of lung ([facility])       HISTORY OF PRESENTING ILLNESS:  Marcus Cortez 71 y.o.  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  Patient noted to have a fever of 102 that started approximately a few days ago. As per the daughter he noted to have a pus at the site of his right groin/incision from his carotid endarterectomy. Patient was started on Keflex; symptoms improved since yesterday.  Overall he feels poorly. Chronic cough. Mild shortness of breath. No obvious hemoptysis. Complains of chills. Denies any nausea vomiting. Denies any pain. No weight loss. No tingling or numbness. he states to be drinking enough fluids.  ROS: A complete 10 point review of system is done which is negative except mentioned above in history of present illness  MEDICAL HISTORY:  Past Medical History:  Diagnosis Date  . Allergy   . Cancer ([place]) 03/2016   lung  . Carotid artery bruit   . CINV (chemotherapy-induced nausea and vomiting)   . Emphysema lung ([place])   . Emphysema of lung ([place])   . Former smoker   . Heart murmur   .  Hyperlipidemia   . Hypertension   . IFG (impaired fasting glucose)   . Osteoporosis   . Pulmonary stenosis     SURGICAL HISTORY: Past Surgical History:  Procedure Laterality Date  . CAROTID PTA/STENT INTERVENTION Right 05/22/2016   Procedure: Carotid PTA/Stent Intervention;  Surgeon: Katha Cabal, MD;  Location: [facility];  Service: Cardiovascular;  Laterality: Right;  . EYE SURGERY  2010   cataract  . 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 Previous history of smoking half a pack a day for 25-30 years. Quit approximately 2 years ago. Social History   Social History  . Marital status: Widowed    Spouse name: N/A  . Number of children: N/A  . Years of education: N/A   Occupational History  . Not on file.   Social History Main Topics  . Smoking status: Former Smoker    Packs/day: 0.50    Years: 35.00    Types: Cigarettes    Quit date: 01/10/2015  . Smokeless tobacco: Former Systems developer    Types: Chew    Quit date: 04/10/1978  . Alcohol use No  . Drug use: No  . Sexual activity: Yes   Other Topics Concern  . Not on file   Social History Narrative  . No narrative on file  FAMILY HISTORY: Family History  Problem Relation Age of Onset  . Hypertension Mother   . Thyroid disease Mother   . Glaucoma Mother   . Alzheimer's disease Father   . Stroke Maternal Grandmother   . Cancer Maternal Grandfather   . Alzheimer's disease Paternal Grandmother   . Stroke Paternal Grandfather     ALLERGIES:  has No Known Allergies.  MEDICATIONS:  Current Outpatient Prescriptions  Medication Sig Dispense Refill  . acetaminophen (TYLENOL) 325 MG tablet Take 325-650 mg by mouth every 6 (six) hours as needed (for pain/headache.).     Marland Kitchen amLODipine (NORVASC) 10 MG tablet  Take 1 tablet (10 mg total) by mouth daily. 90 tablet 3  . aspirin 81 MG tablet Take 81 mg by mouth daily. TAKE 3-4 TIMES A WEEK    . cephALEXin (KEFLEX) 500 MG capsule Take 1 capsule (500 mg total) by mouth 3 (three) times daily. 21 capsule 0  . chlorpheniramine-HYDROcodone (TUSSIONEX) 10-8 MG/5ML SUER Take 5 mLs by mouth every 12 (twelve) hours as needed for cough. 140 mL 0  . clopidogrel (PLAVIX) 75 MG tablet Take 1 tablet (75 mg total) by mouth daily with breakfast. 30 tablet 6  . ezetimibe (ZETIA) 10 MG tablet Take 1 tablet (10 mg total) by mouth daily. 30 tablet 11  . fluticasone (FLONASE) 50 MCG/ACT nasal spray Place 2 sprays into both nostrils daily as needed for allergies or rhinitis.     Marland Kitchen gemfibrozil (LOPID) 600 MG tablet Take 600 mg by mouth 2 (two) times daily.     Marland Kitchen losartan (COZAAR) 100 MG tablet Take 1 tablet (100 mg total) by mouth daily. (Patient taking differently: Take 50 mg by mouth daily. BASED ON BLOOD PRESSURE READINGS) 30 tablet 11  . naproxen sodium (ANAPROX) 220 MG tablet Take 220 mg by mouth 2 (two) times daily as needed (pain).     . ondansetron (ZOFRAN) 8 MG tablet Take 1 tablet (8 mg total) by mouth every 8 (eight) hours as needed for nausea or vomiting (start 3 days; after chemo). 40 tablet 1  . Probiotic CAPS Take 1 capsule by mouth See admin instructions. Take 2-3 times a week.    . prochlorperazine (COMPAZINE) 10 MG tablet Take 1 tablet (10 mg total) by mouth every 6 (six) hours as needed for nausea or vomiting. 40 tablet 1  . rosuvastatin (CRESTOR) 5 MG tablet Take 1 tablet (5 mg total) by mouth as directed. Take 1 tablet twice a  week 15 tablet 6  . sucralfate (CARAFATE) 1 g tablet Take 1 tablet (1 g total) by mouth 3 (three) times daily. Dissolve in 2-3 tbsp warm water, swish and swallow 90 tablet 3   No current facility-administered medications for this visit.       Marland Kitchen  PHYSICAL EXAMINATION: ECOG PERFORMANCE STATUS: 0 - Asymptomatic  Vitals:   06/12/16  0931  BP: 122/68  Pulse: 81  Resp: 18  Temp: 98.5 F (36.9 C)   Filed Weights   06/12/16 0931  Weight: 204 lb 6 oz (92.7 kg)    GENERAL: Well-nourished well-developed; Alert, no distress and comfortable.   With His daughter.  EYES: no pallor or icterus OROPHARYNX: no thrush or ulceration; good dentition  NECK: supple, no masses felt LYMPH:  no palpable lymphadenopathy in the cervical, axillary or inguinal regions LUNGS: Decreased breath sounds to auscultation on left side and  No wheeze or crackles HEART/CVS: regular rate & rhythm and no murmurs; No lower extremity edema ABDOMEN: abdomen  soft, non-tender and normal bowel sounds Musculoskeletal:no cyanosis of digits and no clubbing  PSYCH: alert & oriented x 3 with fluent speech NEURO: no focal motor/sensory deficits SKIN:  no rashes or significant lesions; right inguinal incision well-healed. No signs of infection.  LABORATORY DATA:  I have reviewed the data as listed Lab Results  Component Value Date   WBC 5.1 06/12/2016   HGB 9.9 (L) 06/12/2016   HCT 28.2 (L) 06/12/2016   MCV 91.3 06/12/2016   PLT 117 (L) 06/12/2016    Recent Labs  04/12/16 1415 05/06/16 1100  05/29/16 0758 06/05/16 0900 06/12/16 0858  NA 132* 133*  < > 136 138 135  K 3.2* 2.9*  < > 4.0 4.0 3.4*  CL 100* 93*  < > 103 104 104  CO2 24 28  < > _0 GLUCOSE 102* 94  < > 106* 69 91  BUN 21* 23*  < > 26* 23* 23*  CREATININE 1.16 1.27*  < > 1.60* 1.41* 1.31*  CALCIUM 9.1 9.4  < > 9.2 9.0 8.3*  GFRNONAA >60 55*  < > 41* 48* 53*  GFRAA >60 >60  < > 48* 56* >60  PROT 8.6* 8.8*  --   --   --  7.6  ALBUMIN 4.4 4.7  --   --   --  3.4*  AST 27 37  --   --   --  18  ALT 19 41  --   --   --  14*  ALKPHOS 66 70  --   --   --  44  BILITOT 0.7 1.0  --   --   --  0.6  < > = values in this interval not displayed.  RADIOGRAPHIC STUDIES: I have personally reviewed the radiological images as listed and agreed with the findings in the report. Dg Chest 2  View  Result Date: 06/12/2016 CLINICAL DATA:  Productive cough over the last month. Fever. Lung cancer being treated with radiation and chemotherapy. EXAM: CHEST  2 VIEW COMPARISON:  04/23/2016.  03/21/2016. FINDINGS: Chronic cardiomegaly. Chronic aortic atherosclerosis. Chronic pulmonary fibrotic pattern. No active process seen on the right. On the left, left hilar region mass and left lower lobe volume loss/ infiltrate. Similar appearance to the previous study, with slight worsening of the left lower lobe density. IMPRESSION: Left hilar region mass and volume loss appears similar to the previous study. Some worsening of left lower lobe density that could be due to atelectasis or pneumonia. Electronically Signed   By: Nelson Chimes M.D.   On: 06/12/2016 11:25    ASSESSMENT & PLAN:   Primary cancer of left upper lobe of lung ([place]) # Adenocarcinoma LUL- T3N0 [however non-PET avid mediastinal LN; LLL infiltrative changes]. Recommend chemoradiation definitive; not a surgical candidate. Currently on concurrent chemo- with RT.  # HOLD chemo today [see discussion below]; reval in 1 week.  # Fever/chills [place] ]- ? Etiology; s/p keflex.check Blood cultures; UA; CXR; viral infection  # Right groin- cath- currently no signs of infection.   # Mild thrombocytopenia- ? Etiology; ITP vs others- monitor for now.   # CKD- Stage III- baseline creat 1.3; today; 1.6- recommend increase fluids; avoid NSAIDs.   # s/p CEA- R [on asprin/plavix]  # follow up in 1 weeks/ labs; carbo-taxol weekly chemo.     Cammie Sickle, MD 06/12/2016 4:44 PM

## 2016-06-12 NOTE — Progress Notes (Signed)
No treatment today per Dr. Rogue Bussing. LJ

## 2016-06-12 NOTE — Assessment & Plan Note (Addendum)
#   Adenocarcinoma LUL- T3N0 [however non-PET avid mediastinal LN; LLL infiltrative changes]. Recommend chemoradiation definitive; not a surgical candidate. Currently on concurrent chemo- with RT.  # HOLD chemo today [see discussion below]; reval in 1 week.  # Fever/chills Marcus Cortez ]- ? Etiology; s/p keflex.check Blood cultures; UA; CXR; viral infection  # Right groin- cath- currently no signs of infection.   # Mild thrombocytopenia- ? Etiology; ITP vs others- monitor for now.   # CKD- Stage III- baseline creat 1.3; today; 1.6- recommend increase fluids; avoid NSAIDs.   # s/p CEA- R [on asprin/plavix]  # follow up in 1 weeks/ labs; carbo-taxol weekly chemo.

## 2016-06-12 NOTE — Progress Notes (Signed)
Patient states he has run a temp and had chills since last Friday.  He has taken tylenol q4h.  Patient had a carotid stent placed and got an infection.  Currently on antibiotics.  Feels very weak today.  States he has not had much of an appetite and has not been sleeping well either.

## 2016-06-13 ENCOUNTER — Ambulatory Visit
Admission: RE | Admit: 2016-06-13 | Discharge: 2016-06-13 | Disposition: A | Discharge status: Home / Self Care | Payer: Medicare HMO | Source: Ambulatory Visit | Attending: Radiation Oncology | Admitting: Radiation Oncology

## 2016-06-13 DIAGNOSIS — C3412 Malignant neoplasm of upper lobe, left bronchus or lung: Secondary | ICD-10-CM | POA: Diagnosis not present

## 2016-06-13 DIAGNOSIS — M818 Other osteoporosis without current pathological fracture: Secondary | ICD-10-CM | POA: Diagnosis not present

## 2016-06-13 DIAGNOSIS — J439 Emphysema, unspecified: Secondary | ICD-10-CM | POA: Diagnosis not present

## 2016-06-13 DIAGNOSIS — E785 Hyperlipidemia, unspecified: Secondary | ICD-10-CM | POA: Diagnosis not present

## 2016-06-13 DIAGNOSIS — I2510 Atherosclerotic heart disease of native coronary artery without angina pectoris: Secondary | ICD-10-CM | POA: Diagnosis not present

## 2016-06-13 DIAGNOSIS — Z7982 Long term (current) use of aspirin: Secondary | ICD-10-CM | POA: Diagnosis not present

## 2016-06-13 DIAGNOSIS — Z510 Encounter for antineoplastic radiation therapy: Secondary | ICD-10-CM | POA: Diagnosis not present

## 2016-06-13 DIAGNOSIS — J8410 Pulmonary fibrosis, unspecified: Secondary | ICD-10-CM | POA: Diagnosis not present

## 2016-06-13 DIAGNOSIS — R7301 Impaired fasting glucose: Secondary | ICD-10-CM | POA: Diagnosis not present

## 2016-06-13 DIAGNOSIS — R0602 Shortness of breath: Secondary | ICD-10-CM | POA: Diagnosis not present

## 2016-06-13 LAB — URINE CULTURE: Culture: NO GROWTH

## 2016-06-14 ENCOUNTER — Ambulatory Visit
Admission: RE | Admit: 2016-06-14 | Discharge: 2016-06-14 | Disposition: A | Discharge status: Home / Self Care | Payer: Medicare HMO | Source: Ambulatory Visit | Attending: Radiation Oncology | Admitting: Radiation Oncology

## 2016-06-14 DIAGNOSIS — M818 Other osteoporosis without current pathological fracture: Secondary | ICD-10-CM | POA: Diagnosis not present

## 2016-06-14 DIAGNOSIS — R7301 Impaired fasting glucose: Secondary | ICD-10-CM | POA: Diagnosis not present

## 2016-06-14 DIAGNOSIS — J8410 Pulmonary fibrosis, unspecified: Secondary | ICD-10-CM | POA: Diagnosis not present

## 2016-06-14 DIAGNOSIS — E785 Hyperlipidemia, unspecified: Secondary | ICD-10-CM | POA: Diagnosis not present

## 2016-06-14 DIAGNOSIS — C3412 Malignant neoplasm of upper lobe, left bronchus or lung: Secondary | ICD-10-CM | POA: Diagnosis not present

## 2016-06-14 DIAGNOSIS — Z7982 Long term (current) use of aspirin: Secondary | ICD-10-CM | POA: Diagnosis not present

## 2016-06-14 DIAGNOSIS — J439 Emphysema, unspecified: Secondary | ICD-10-CM | POA: Diagnosis not present

## 2016-06-14 DIAGNOSIS — Z510 Encounter for antineoplastic radiation therapy: Secondary | ICD-10-CM | POA: Diagnosis not present

## 2016-06-14 DIAGNOSIS — R0602 Shortness of breath: Secondary | ICD-10-CM | POA: Diagnosis not present

## 2016-06-14 DIAGNOSIS — I2510 Atherosclerotic heart disease of native coronary artery without angina pectoris: Secondary | ICD-10-CM | POA: Diagnosis not present

## 2016-06-17 ENCOUNTER — Ambulatory Visit
Admission: RE | Admit: 2016-06-17 | Discharge: 2016-06-17 | Disposition: A | Discharge status: Home / Self Care | Payer: Medicare HMO | Source: Ambulatory Visit | Attending: Radiation Oncology | Admitting: Radiation Oncology

## 2016-06-17 DIAGNOSIS — J439 Emphysema, unspecified: Secondary | ICD-10-CM | POA: Diagnosis not present

## 2016-06-17 DIAGNOSIS — Z510 Encounter for antineoplastic radiation therapy: Secondary | ICD-10-CM | POA: Diagnosis not present

## 2016-06-17 DIAGNOSIS — C3412 Malignant neoplasm of upper lobe, left bronchus or lung: Secondary | ICD-10-CM | POA: Diagnosis not present

## 2016-06-17 DIAGNOSIS — I2510 Atherosclerotic heart disease of native coronary artery without angina pectoris: Secondary | ICD-10-CM | POA: Diagnosis not present

## 2016-06-17 DIAGNOSIS — Z7982 Long term (current) use of aspirin: Secondary | ICD-10-CM | POA: Diagnosis not present

## 2016-06-17 DIAGNOSIS — R7301 Impaired fasting glucose: Secondary | ICD-10-CM | POA: Diagnosis not present

## 2016-06-17 DIAGNOSIS — E785 Hyperlipidemia, unspecified: Secondary | ICD-10-CM | POA: Diagnosis not present

## 2016-06-17 DIAGNOSIS — J8410 Pulmonary fibrosis, unspecified: Secondary | ICD-10-CM | POA: Diagnosis not present

## 2016-06-17 DIAGNOSIS — R0602 Shortness of breath: Secondary | ICD-10-CM | POA: Diagnosis not present

## 2016-06-17 DIAGNOSIS — M818 Other osteoporosis without current pathological fracture: Secondary | ICD-10-CM | POA: Diagnosis not present

## 2016-06-17 LAB — CULTURE, BLOOD (ROUTINE X 2)
CULTURE: NO GROWTH
Culture: NO GROWTH

## 2016-06-18 ENCOUNTER — Ambulatory Visit
Admission: RE | Admit: 2016-06-18 | Discharge: 2016-06-18 | Disposition: A | Discharge status: Home / Self Care | Payer: Medicare HMO | Source: Ambulatory Visit | Attending: Radiation Oncology | Admitting: Radiation Oncology

## 2016-06-18 DIAGNOSIS — M818 Other osteoporosis without current pathological fracture: Secondary | ICD-10-CM | POA: Diagnosis not present

## 2016-06-18 DIAGNOSIS — J439 Emphysema, unspecified: Secondary | ICD-10-CM | POA: Diagnosis not present

## 2016-06-18 DIAGNOSIS — E785 Hyperlipidemia, unspecified: Secondary | ICD-10-CM | POA: Diagnosis not present

## 2016-06-18 DIAGNOSIS — C3412 Malignant neoplasm of upper lobe, left bronchus or lung: Secondary | ICD-10-CM | POA: Diagnosis not present

## 2016-06-18 DIAGNOSIS — R0602 Shortness of breath: Secondary | ICD-10-CM | POA: Diagnosis not present

## 2016-06-18 DIAGNOSIS — J8410 Pulmonary fibrosis, unspecified: Secondary | ICD-10-CM | POA: Diagnosis not present

## 2016-06-18 DIAGNOSIS — I2510 Atherosclerotic heart disease of native coronary artery without angina pectoris: Secondary | ICD-10-CM | POA: Diagnosis not present

## 2016-06-18 DIAGNOSIS — Z510 Encounter for antineoplastic radiation therapy: Secondary | ICD-10-CM | POA: Diagnosis not present

## 2016-06-18 DIAGNOSIS — Z7982 Long term (current) use of aspirin: Secondary | ICD-10-CM | POA: Diagnosis not present

## 2016-06-18 DIAGNOSIS — R7301 Impaired fasting glucose: Secondary | ICD-10-CM | POA: Diagnosis not present

## 2016-06-19 ENCOUNTER — Ambulatory Visit
Admission: RE | Admit: 2016-06-19 | Discharge: 2016-06-19 | Disposition: A | Discharge status: Home / Self Care | Payer: Medicare HMO | Source: Ambulatory Visit | Attending: Radiation Oncology | Admitting: Radiation Oncology

## 2016-06-19 ENCOUNTER — Inpatient Hospital Stay: Payer: Medicare HMO

## 2016-06-19 ENCOUNTER — Inpatient Hospital Stay (HOSPITAL_BASED_OUTPATIENT_CLINIC_OR_DEPARTMENT_OTHER): Payer: Medicare HMO | Admitting: Internal Medicine

## 2016-06-19 VITALS — BP 107/62 | HR 76 | Temp 97.8°F | Resp 18 | Ht 73.0 in | Wt 199.2 lb

## 2016-06-19 VITALS — BP 100/52 | HR 74 | Resp 20

## 2016-06-19 DIAGNOSIS — R509 Fever, unspecified: Secondary | ICD-10-CM

## 2016-06-19 DIAGNOSIS — D696 Thrombocytopenia, unspecified: Secondary | ICD-10-CM

## 2016-06-19 DIAGNOSIS — M818 Other osteoporosis without current pathological fracture: Secondary | ICD-10-CM | POA: Diagnosis not present

## 2016-06-19 DIAGNOSIS — Z87891 Personal history of nicotine dependence: Secondary | ICD-10-CM

## 2016-06-19 DIAGNOSIS — E785 Hyperlipidemia, unspecified: Secondary | ICD-10-CM

## 2016-06-19 DIAGNOSIS — N183 Chronic kidney disease, stage 3 (moderate): Secondary | ICD-10-CM

## 2016-06-19 DIAGNOSIS — I700 Atherosclerosis of aorta: Secondary | ICD-10-CM

## 2016-06-19 DIAGNOSIS — R634 Abnormal weight loss: Secondary | ICD-10-CM

## 2016-06-19 DIAGNOSIS — Z7982 Long term (current) use of aspirin: Secondary | ICD-10-CM

## 2016-06-19 DIAGNOSIS — J8410 Pulmonary fibrosis, unspecified: Secondary | ICD-10-CM | POA: Diagnosis not present

## 2016-06-19 DIAGNOSIS — Z77090 Contact with and (suspected) exposure to asbestos: Secondary | ICD-10-CM

## 2016-06-19 DIAGNOSIS — C3412 Malignant neoplasm of upper lobe, left bronchus or lung: Secondary | ICD-10-CM

## 2016-06-19 DIAGNOSIS — M810 Age-related osteoporosis without current pathological fracture: Secondary | ICD-10-CM

## 2016-06-19 DIAGNOSIS — R481 Agnosia: Secondary | ICD-10-CM

## 2016-06-19 DIAGNOSIS — B349 Viral infection, unspecified: Secondary | ICD-10-CM

## 2016-06-19 DIAGNOSIS — J439 Emphysema, unspecified: Secondary | ICD-10-CM | POA: Diagnosis not present

## 2016-06-19 DIAGNOSIS — Z79899 Other long term (current) drug therapy: Secondary | ICD-10-CM

## 2016-06-19 DIAGNOSIS — R0602 Shortness of breath: Secondary | ICD-10-CM | POA: Diagnosis not present

## 2016-06-19 DIAGNOSIS — I2510 Atherosclerotic heart disease of native coronary artery without angina pectoris: Secondary | ICD-10-CM | POA: Diagnosis not present

## 2016-06-19 DIAGNOSIS — I6523 Occlusion and stenosis of bilateral carotid arteries: Secondary | ICD-10-CM | POA: Diagnosis not present

## 2016-06-19 DIAGNOSIS — Z5111 Encounter for antineoplastic chemotherapy: Secondary | ICD-10-CM | POA: Diagnosis not present

## 2016-06-19 DIAGNOSIS — R630 Anorexia: Secondary | ICD-10-CM

## 2016-06-19 DIAGNOSIS — Z510 Encounter for antineoplastic radiation therapy: Secondary | ICD-10-CM | POA: Diagnosis not present

## 2016-06-19 DIAGNOSIS — I517 Cardiomegaly: Secondary | ICD-10-CM

## 2016-06-19 DIAGNOSIS — I129 Hypertensive chronic kidney disease with stage 1 through stage 4 chronic kidney disease, or unspecified chronic kidney disease: Secondary | ICD-10-CM

## 2016-06-19 DIAGNOSIS — R7301 Impaired fasting glucose: Secondary | ICD-10-CM | POA: Diagnosis not present

## 2016-06-19 LAB — COMPREHENSIVE METABOLIC PANEL
ALBUMIN: 3.4 g/dL — AB (ref 3.5–5.0)
ALK PHOS: 57 U/L (ref 38–126)
ALT: 22 U/L (ref 17–63)
AST: 20 U/L (ref 15–41)
Anion gap: 7 (ref 5–15)
BILIRUBIN TOTAL: 0.6 mg/dL (ref 0.3–1.2)
BUN: 22 mg/dL — AB (ref 6–20)
CO2: 25 mmol/L (ref 22–32)
Calcium: 8.7 mg/dL — ABNORMAL LOW (ref 8.9–10.3)
Chloride: 101 mmol/L (ref 101–111)
Creatinine, Ser: 1.28 mg/dL — ABNORMAL HIGH (ref 0.61–1.24)
GFR calc Af Amer: >60 mL/min (ref >60)
GFR, EST NON AFRICAN AMERICAN: 54 mL/min — AB (ref >60)
GLUCOSE: 92 mg/dL (ref 65–99)
Potassium: 4.1 mmol/L (ref 3.5–5.1)
Sodium: 133 mmol/L — ABNORMAL LOW (ref 135–145)
Total Protein: 8.2 g/dL — ABNORMAL HIGH (ref 6.5–8.1)

## 2016-06-19 LAB — CBC WITH DIFFERENTIAL/PLATELET
Basophils Absolute: 0 10*3/uL (ref 0–0.1)
Basophils Relative: 1 %
EOS PCT: 1 %
Eosinophils Absolute: 0 10*3/uL (ref 0–0.7)
HEMATOCRIT: 29.1 % — AB (ref 40.0–52.0)
Hemoglobin: 10.2 g/dL — ABNORMAL LOW (ref 13.0–18.0)
LYMPHS ABS: 0.4 10*3/uL — AB (ref 1.0–3.6)
LYMPHS PCT: 8 %
MCH: 31.7 pg (ref 26.0–34.0)
MCHC: 34.9 g/dL (ref 32.0–36.0)
MCV: 91 fL (ref 80.0–100.0)
MONO ABS: 0.5 10*3/uL (ref 0.2–1.0)
MONOS PCT: 10 %
Neutro Abs: 4.4 10*3/uL (ref 1.4–6.5)
Neutrophils Relative %: 80 %
PLATELETS: 127 10*3/uL — AB (ref 150–440)
RBC: 3.2 MIL/uL — ABNORMAL LOW (ref 4.40–5.90)
RDW: 15.4 % — AB (ref 11.5–14.5)
WBC: 5.4 10*3/uL (ref 3.8–10.6)

## 2016-06-19 MED ORDER — PALONOSETRON HCL INJECTION 0.25 MG/5ML
0.2500 mg | Freq: Once | INTRAVENOUS | Status: AC
  Administered 2016-06-19: 0.25 mg via INTRAVENOUS
  Filled 2016-06-19: qty 5

## 2016-06-19 MED ORDER — SODIUM CHLORIDE 0.9 % IV SOLN
20.0000 mg | Freq: Once | INTRAVENOUS | Status: AC
  Administered 2016-06-19: 20 mg via INTRAVENOUS
  Filled 2016-06-19: qty 2

## 2016-06-19 MED ORDER — PACLITAXEL CHEMO INJECTION 300 MG/50ML
45.0000 mg/m2 | Freq: Once | INTRAVENOUS | Status: AC
  Administered 2016-06-19: 96 mg via INTRAVENOUS
  Filled 2016-06-19: qty 16

## 2016-06-19 MED ORDER — DIPHENHYDRAMINE HCL 50 MG/ML IJ SOLN
50.0000 mg | Freq: Once | INTRAMUSCULAR | Status: AC
  Administered 2016-06-19: 50 mg via INTRAVENOUS
  Filled 2016-06-19: qty 1

## 2016-06-19 MED ORDER — SODIUM CHLORIDE 0.9 % IV SOLN
180.0000 mg | Freq: Once | INTRAVENOUS | Status: AC
  Administered 2016-06-19: 180 mg via INTRAVENOUS
  Filled 2016-06-19: qty 18

## 2016-06-19 MED ORDER — SODIUM CHLORIDE 0.9 % IV SOLN
Freq: Once | INTRAVENOUS | Status: AC
  Administered 2016-06-19: 10:00:00 via INTRAVENOUS
  Filled 2016-06-19: qty 1000

## 2016-06-19 MED ORDER — FAMOTIDINE IN NACL 20-0.9 MG/50ML-% IV SOLN
20.0000 mg | Freq: Once | INTRAVENOUS | Status: AC
  Administered 2016-06-19: 20 mg via INTRAVENOUS
  Filled 2016-06-19: qty 50

## 2016-06-19 NOTE — Progress Notes (Signed)
Patient here for lung cancer follow-up. He reports

## 2016-06-19 NOTE — Progress Notes (Signed)
[place] NOTE  Patient Care Team: Kathrine Haddock, NP as PCP - General (Nurse Practitioner) Minna Merritts, MD as Consulting Physician (Cardiology)  CHIEF COMPLAINTS/PURPOSE OF CONSULTATION: Lung mass; highly concerning for cancer.   #   Oncology History   # FEB 2018- [place] CA LUL [CT Bx] T3N0 [abutting mediastinum; Mediastinal LN on CT; Neg on PET]; NOT surgical candidate [Dr.Oaks/ Dr.Henderickson; [place]];   # Carbo-Taxol RT [[facility]]  # Mild Thrombocytopenia [130s?]  # MILD THROMBOCYTOPENIA [130-140s]; Feb 2018- MR brain-NEG  # Bil Carotid stenosis [R>90% s/p CEA on 05/22/16; L~50%; Dr.Schneir].   # MOL Testing- ROS-1;ALK/EGFR-NEG.      Primary cancer of left upper lobe of lung ([place])   04/24/2016 Initial Diagnosis    Primary cancer of left upper lobe of lung (H[facility])       HISTORY OF PRESENTING ILLNESS:  Marcus Cortez 71 y.o.  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  Patient's chemotherapy was held last week because of fevers. Patient's fevers have resolved. Treated with antibiotics. Blood cultures were negative. Chest x-ray atelectasis versus lung cancer. Not any worse.  He feels better. Continues to have Chronic cough. Mild shortness of breath.  Denies any nausea vomiting. Denies any pain. No weight loss. No tingling or numbness. He states he been drinking enough fluids.   ROS: A complete 10 point review of system is done which is negative except mentioned above in history of present illness  MEDICAL HISTORY:  Past Medical History:  Diagnosis Date  . Allergy   . Cancer ([place]) 03/2016   lung  . Carotid artery bruit   . CINV (chemotherapy-induced nausea and vomiting)   . Emphysema lung ([place])   . Emphysema of lung ([place])   . Former smoker   . Heart murmur   . Hyperlipidemia   . Hypertension   . IFG (impaired fasting glucose)   . Osteoporosis   .  Pulmonary stenosis     SURGICAL HISTORY: Past Surgical History:  Procedure Laterality Date  . CAROTID PTA/STENT INTERVENTION Right 05/22/2016   Procedure: Carotid PTA/Stent Intervention;  Surgeon: Katha Cabal, MD;  Location: [facility];  Service: Cardiovascular;  Laterality: Right;  . EYE SURGERY  2010   cataract  . 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 Previous history of smoking half a pack a day for 25-30 years. Quit approximately 2 years ago. Social History   Social History  . Marital status: Widowed    Spouse name: N/A  . Number of children: N/A  . Years of education: N/A   Occupational History  . Not on file.   Social History Main Topics  . Smoking status: Former Smoker    Packs/day: 0.50    Years: 35.00    Types: Cigarettes    Quit date: 01/10/2015  . Smokeless tobacco: Former Systems developer    Types: Chew    Quit date: 04/10/1978  . Alcohol use No  . Drug use: No  . Sexual activity: Yes   Other Topics Concern  . Not on file   Social History Narrative  . No narrative on file    FAMILY HISTORY: Family History  Problem Relation Age of Onset  . Hypertension Mother   .  Thyroid disease Mother   . Glaucoma Mother   . Alzheimer's disease Father   . Stroke Maternal Grandmother   . Cancer Maternal Grandfather   . Alzheimer's disease Paternal Grandmother   . Stroke Paternal Grandfather     ALLERGIES:  has No Known Allergies.  MEDICATIONS:  Current Outpatient Prescriptions  Medication Sig Dispense Refill  . acetaminophen (TYLENOL) 325 MG tablet Take 325-650 mg by mouth every 6 (six) hours as needed (for pain/headache.).     Marland Kitchen amLODipine (NORVASC) 10 MG tablet Take 1 tablet (10 mg total) by mouth daily. 90 tablet 3  . aspirin 81 MG tablet Take 81 mg  by mouth daily. TAKE 3-4 TIMES A WEEK    . chlorpheniramine-HYDROcodone (TUSSIONEX) 10-8 MG/5ML SUER Take 5 mLs by mouth every 12 (twelve) hours as needed for cough. 140 mL 0  . clopidogrel (PLAVIX) 75 MG tablet Take 1 tablet (75 mg total) by mouth daily with breakfast. 30 tablet 6  . ezetimibe (ZETIA) 10 MG tablet Take 1 tablet (10 mg total) by mouth daily. 30 tablet 11  . gemfibrozil (LOPID) 600 MG tablet Take 600 mg by mouth 2 (two) times daily.     Marland Kitchen losartan (COZAAR) 100 MG tablet Take 1 tablet (100 mg total) by mouth daily. (Patient taking differently: Take 50 mg by mouth daily. BASED ON BLOOD PRESSURE READINGS) 30 tablet 11  . sucralfate (CARAFATE) 1 g tablet Take 1 tablet (1 g total) by mouth 3 (three) times daily. Dissolve in 2-3 tbsp warm water, swish and swallow 90 tablet 3  . fluticasone (FLONASE) 50 MCG/ACT nasal spray Place 2 sprays into both nostrils daily as needed for allergies or rhinitis.     Marland Kitchen ondansetron (ZOFRAN) 8 MG tablet Take 1 tablet (8 mg total) by mouth every 8 (eight) hours as needed for nausea or vomiting (start 3 days; after chemo). (Patient not taking: Reported on 06/19/2016) 40 tablet 1  . prochlorperazine (COMPAZINE) 10 MG tablet Take 1 tablet (10 mg total) by mouth every 6 (six) hours as needed for nausea or vomiting. (Patient not taking: Reported on 06/19/2016) 40 tablet 1   No current facility-administered medications for this visit.       Marland Kitchen  PHYSICAL EXAMINATION: ECOG PERFORMANCE STATUS: 0 - Asymptomatic  Vitals:   06/19/16 0845  BP: 107/62  Pulse: 76  Resp: 18  Temp: 97.8 F (36.6 C)   Filed Weights   06/19/16 0845  Weight: 199 lb 3.2 oz (90.4 kg)    GENERAL: Well-nourished well-developed; Alert, no distress and comfortable.   With His daughter.  EYES: no pallor or icterus OROPHARYNX: no thrush or ulceration; good dentition  NECK: supple, no masses felt LYMPH:  no palpable lymphadenopathy in the cervical, axillary or inguinal regions LUNGS:  Decreased breath sounds to auscultation on left side and  No wheeze or crackles HEART/CVS: regular rate & rhythm and no murmurs; No lower extremity edema ABDOMEN: abdomen soft, non-tender and normal bowel sounds Musculoskeletal:no cyanosis of digits and no clubbing  PSYCH: alert & oriented x 3 with fluent speech NEURO: no focal motor/sensory deficits SKIN:  no rashes or significant lesions.   LABORATORY DATA:  I have reviewed the data as listed Lab Results  Component Value Date   WBC 5.4 06/19/2016   HGB 10.2 (L) 06/19/2016   HCT 29.1 (L) 06/19/2016   MCV 91.0 06/19/2016   PLT 127 (L) 06/19/2016    Recent Labs  05/06/16 1100  06/05/16 0900 06/12/16 0858 06/19/16 5726  NA 133*  < > 138 135 133*  K 2.9*  < > 4.0 3.4* 4.1  CL 93*  < > 104 104 101  CO2 28  < > _0 GLUCOSE 94  < > 69 91 92  BUN 23*  < > 23* 23* 22*  CREATININE 1.27*  < > 1.41* 1.31* 1.28*  CALCIUM 9.4  < > 9.0 8.3* 8.7*  GFRNONAA 55*  < > 48* 53* 54*  GFRAA >60  < > 56* >60 >60  PROT 8.8*  --   --  7.6 8.2*  ALBUMIN 4.7  --   --  3.4* 3.4*  AST 37  --   --  18 20  ALT 41  --   --  14* 22  ALKPHOS 70  --   --  44 57  BILITOT 1.0  --   --  0.6 0.6  < > = values in this interval not displayed.  RADIOGRAPHIC STUDIES: I have personally reviewed the radiological images as listed and agreed with the findings in the report. Dg Chest 2 View  Result Date: 06/12/2016 CLINICAL DATA:  Productive cough over the last month. Fever. Lung cancer being treated with radiation and chemotherapy. EXAM: CHEST  2 VIEW COMPARISON:  04/23/2016.  03/21/2016. FINDINGS: Chronic cardiomegaly. Chronic aortic atherosclerosis. Chronic pulmonary fibrotic pattern. No active process seen on the right. On the left, left hilar region mass and left lower lobe volume loss/ infiltrate. Similar appearance to the previous study, with slight worsening of the left lower lobe density. IMPRESSION: Left hilar region mass and volume loss appears  similar to the previous study. Some worsening of left lower lobe density that could be due to atelectasis or pneumonia. Electronically Signed   By: Nelson Chimes M.D.   On: 06/12/2016 11:25    ASSESSMENT & PLAN:   Primary cancer of left upper lobe of lung ([place]) # Adenocarcinoma LUL- T3N0 [however non-PET avid mediastinal LN; LLL infiltrative changes].  Currently on definitive concurrent chemo- with RT.  # Chemotherapy was held last week because of fevers [C discussion below]; currently doing well. She was improved.  # Proceed with carbo-taxol today [#3]- with RT; Labs today reviewed;  acceptable for treatment today.   # Fever/chills [place] ]- ? Etiology- infectious versus tumor fevers; s/p keflex. Resolved.   # Mild thrombocytopenia- ? Etiology; ITP vs others- monitor for now. .   # follow up in 2 weeks/ labs/chemo; carbo-taxol weekly chemo.     Cammie Sickle, MD 06/19/2016 12:57 PM

## 2016-06-19 NOTE — Assessment & Plan Note (Addendum)
#   Adenocarcinoma LUL- T3N0 [however non-PET avid mediastinal LN; LLL infiltrative changes].  Currently on definitive concurrent chemo- with RT.  # Chemotherapy was held last week because of fevers [C discussion below]; currently doing well. She was improved.  # Proceed with carbo-taxol today [#3]- with RT; Labs today reviewed;  acceptable for treatment today.   # Fever/chills [place] ]- ? Etiology- infectious versus tumor fevers; s/p keflex. Resolved.   # Mild thrombocytopenia- ? Etiology; ITP vs others- monitor for now. .   # follow up in 2 weeks/ labs/chemo; carbo-taxol weekly chemo.

## 2016-06-20 ENCOUNTER — Ambulatory Visit
Admission: RE | Admit: 2016-06-20 | Discharge: 2016-06-20 | Disposition: A | Discharge status: Home / Self Care | Payer: Medicare HMO | Source: Ambulatory Visit | Attending: Radiation Oncology | Admitting: Radiation Oncology

## 2016-06-20 DIAGNOSIS — M818 Other osteoporosis without current pathological fracture: Secondary | ICD-10-CM | POA: Diagnosis not present

## 2016-06-20 DIAGNOSIS — I2510 Atherosclerotic heart disease of native coronary artery without angina pectoris: Secondary | ICD-10-CM | POA: Diagnosis not present

## 2016-06-20 DIAGNOSIS — J439 Emphysema, unspecified: Secondary | ICD-10-CM | POA: Diagnosis not present

## 2016-06-20 DIAGNOSIS — R7301 Impaired fasting glucose: Secondary | ICD-10-CM | POA: Diagnosis not present

## 2016-06-20 DIAGNOSIS — Z510 Encounter for antineoplastic radiation therapy: Secondary | ICD-10-CM | POA: Diagnosis not present

## 2016-06-20 DIAGNOSIS — E785 Hyperlipidemia, unspecified: Secondary | ICD-10-CM | POA: Diagnosis not present

## 2016-06-20 DIAGNOSIS — J8410 Pulmonary fibrosis, unspecified: Secondary | ICD-10-CM | POA: Diagnosis not present

## 2016-06-20 DIAGNOSIS — C3412 Malignant neoplasm of upper lobe, left bronchus or lung: Secondary | ICD-10-CM | POA: Diagnosis not present

## 2016-06-20 DIAGNOSIS — Z7982 Long term (current) use of aspirin: Secondary | ICD-10-CM | POA: Diagnosis not present

## 2016-06-20 DIAGNOSIS — R0602 Shortness of breath: Secondary | ICD-10-CM | POA: Diagnosis not present

## 2016-06-21 ENCOUNTER — Ambulatory Visit
Admission: RE | Admit: 2016-06-21 | Discharge: 2016-06-21 | Disposition: A | Discharge status: Home / Self Care | Payer: Medicare HMO | Source: Ambulatory Visit | Attending: Radiation Oncology | Admitting: Radiation Oncology

## 2016-06-21 DIAGNOSIS — J439 Emphysema, unspecified: Secondary | ICD-10-CM | POA: Diagnosis not present

## 2016-06-21 DIAGNOSIS — J8410 Pulmonary fibrosis, unspecified: Secondary | ICD-10-CM | POA: Diagnosis not present

## 2016-06-21 DIAGNOSIS — I2510 Atherosclerotic heart disease of native coronary artery without angina pectoris: Secondary | ICD-10-CM | POA: Diagnosis not present

## 2016-06-21 DIAGNOSIS — Z7982 Long term (current) use of aspirin: Secondary | ICD-10-CM | POA: Diagnosis not present

## 2016-06-21 DIAGNOSIS — R7301 Impaired fasting glucose: Secondary | ICD-10-CM | POA: Diagnosis not present

## 2016-06-21 DIAGNOSIS — Z510 Encounter for antineoplastic radiation therapy: Secondary | ICD-10-CM | POA: Diagnosis not present

## 2016-06-21 DIAGNOSIS — E785 Hyperlipidemia, unspecified: Secondary | ICD-10-CM | POA: Diagnosis not present

## 2016-06-21 DIAGNOSIS — M818 Other osteoporosis without current pathological fracture: Secondary | ICD-10-CM | POA: Diagnosis not present

## 2016-06-21 DIAGNOSIS — R0602 Shortness of breath: Secondary | ICD-10-CM | POA: Diagnosis not present

## 2016-06-21 DIAGNOSIS — C3412 Malignant neoplasm of upper lobe, left bronchus or lung: Secondary | ICD-10-CM | POA: Diagnosis not present

## 2016-06-24 ENCOUNTER — Ambulatory Visit
Admission: RE | Admit: 2016-06-24 | Discharge: 2016-06-24 | Disposition: A | Discharge status: Home / Self Care | Payer: Medicare HMO | Source: Ambulatory Visit | Attending: Radiation Oncology | Admitting: Radiation Oncology

## 2016-06-24 DIAGNOSIS — J8410 Pulmonary fibrosis, unspecified: Secondary | ICD-10-CM | POA: Diagnosis not present

## 2016-06-24 DIAGNOSIS — E785 Hyperlipidemia, unspecified: Secondary | ICD-10-CM | POA: Diagnosis not present

## 2016-06-24 DIAGNOSIS — I2510 Atherosclerotic heart disease of native coronary artery without angina pectoris: Secondary | ICD-10-CM | POA: Diagnosis not present

## 2016-06-24 DIAGNOSIS — C3412 Malignant neoplasm of upper lobe, left bronchus or lung: Secondary | ICD-10-CM | POA: Diagnosis not present

## 2016-06-24 DIAGNOSIS — M818 Other osteoporosis without current pathological fracture: Secondary | ICD-10-CM | POA: Diagnosis not present

## 2016-06-24 DIAGNOSIS — Z7982 Long term (current) use of aspirin: Secondary | ICD-10-CM | POA: Diagnosis not present

## 2016-06-24 DIAGNOSIS — Z510 Encounter for antineoplastic radiation therapy: Secondary | ICD-10-CM | POA: Diagnosis not present

## 2016-06-24 DIAGNOSIS — R0602 Shortness of breath: Secondary | ICD-10-CM | POA: Diagnosis not present

## 2016-06-24 DIAGNOSIS — R7301 Impaired fasting glucose: Secondary | ICD-10-CM | POA: Diagnosis not present

## 2016-06-24 DIAGNOSIS — J439 Emphysema, unspecified: Secondary | ICD-10-CM | POA: Diagnosis not present

## 2016-06-25 ENCOUNTER — Ambulatory Visit
Admission: RE | Admit: 2016-06-25 | Discharge: 2016-06-25 | Disposition: A | Discharge status: Home / Self Care | Payer: Medicare HMO | Source: Ambulatory Visit | Attending: Radiation Oncology | Admitting: Radiation Oncology

## 2016-06-25 DIAGNOSIS — J8410 Pulmonary fibrosis, unspecified: Secondary | ICD-10-CM | POA: Diagnosis not present

## 2016-06-25 DIAGNOSIS — Z510 Encounter for antineoplastic radiation therapy: Secondary | ICD-10-CM | POA: Diagnosis not present

## 2016-06-25 DIAGNOSIS — R0602 Shortness of breath: Secondary | ICD-10-CM | POA: Diagnosis not present

## 2016-06-25 DIAGNOSIS — Z7982 Long term (current) use of aspirin: Secondary | ICD-10-CM | POA: Diagnosis not present

## 2016-06-25 DIAGNOSIS — E785 Hyperlipidemia, unspecified: Secondary | ICD-10-CM | POA: Diagnosis not present

## 2016-06-25 DIAGNOSIS — M818 Other osteoporosis without current pathological fracture: Secondary | ICD-10-CM | POA: Diagnosis not present

## 2016-06-25 DIAGNOSIS — J439 Emphysema, unspecified: Secondary | ICD-10-CM | POA: Diagnosis not present

## 2016-06-25 DIAGNOSIS — C3412 Malignant neoplasm of upper lobe, left bronchus or lung: Secondary | ICD-10-CM | POA: Diagnosis not present

## 2016-06-25 DIAGNOSIS — R7301 Impaired fasting glucose: Secondary | ICD-10-CM | POA: Diagnosis not present

## 2016-06-25 DIAGNOSIS — I2510 Atherosclerotic heart disease of native coronary artery without angina pectoris: Secondary | ICD-10-CM | POA: Diagnosis not present

## 2016-06-26 ENCOUNTER — Inpatient Hospital Stay: Payer: Medicare HMO

## 2016-06-26 ENCOUNTER — Ambulatory Visit
Admission: RE | Admit: 2016-06-26 | Discharge: 2016-06-26 | Disposition: A | Discharge status: Home / Self Care | Payer: Medicare HMO | Source: Ambulatory Visit | Attending: Radiation Oncology | Admitting: Radiation Oncology

## 2016-06-26 ENCOUNTER — Other Ambulatory Visit: Payer: Self-pay | Admitting: Internal Medicine

## 2016-06-26 VITALS — BP 120/80 | HR 64 | Temp 97.7°F | Resp 20

## 2016-06-26 DIAGNOSIS — Z7982 Long term (current) use of aspirin: Secondary | ICD-10-CM | POA: Diagnosis not present

## 2016-06-26 DIAGNOSIS — J8410 Pulmonary fibrosis, unspecified: Secondary | ICD-10-CM | POA: Diagnosis not present

## 2016-06-26 DIAGNOSIS — C3412 Malignant neoplasm of upper lobe, left bronchus or lung: Secondary | ICD-10-CM

## 2016-06-26 DIAGNOSIS — Z510 Encounter for antineoplastic radiation therapy: Secondary | ICD-10-CM | POA: Diagnosis not present

## 2016-06-26 DIAGNOSIS — M818 Other osteoporosis without current pathological fracture: Secondary | ICD-10-CM | POA: Diagnosis not present

## 2016-06-26 DIAGNOSIS — R0602 Shortness of breath: Secondary | ICD-10-CM | POA: Diagnosis not present

## 2016-06-26 DIAGNOSIS — I2510 Atherosclerotic heart disease of native coronary artery without angina pectoris: Secondary | ICD-10-CM | POA: Diagnosis not present

## 2016-06-26 DIAGNOSIS — R7301 Impaired fasting glucose: Secondary | ICD-10-CM | POA: Diagnosis not present

## 2016-06-26 DIAGNOSIS — J439 Emphysema, unspecified: Secondary | ICD-10-CM | POA: Diagnosis not present

## 2016-06-26 DIAGNOSIS — R630 Anorexia: Secondary | ICD-10-CM

## 2016-06-26 DIAGNOSIS — Z5111 Encounter for antineoplastic chemotherapy: Secondary | ICD-10-CM | POA: Diagnosis not present

## 2016-06-26 DIAGNOSIS — R634 Abnormal weight loss: Secondary | ICD-10-CM

## 2016-06-26 DIAGNOSIS — R481 Agnosia: Secondary | ICD-10-CM

## 2016-06-26 DIAGNOSIS — E785 Hyperlipidemia, unspecified: Secondary | ICD-10-CM | POA: Diagnosis not present

## 2016-06-26 LAB — CBC WITH DIFFERENTIAL/PLATELET
Basophils Absolute: 0 10*3/uL (ref 0–0.1)
Basophils Relative: 1 %
EOS ABS: 0 10*3/uL (ref 0–0.7)
EOS PCT: 1 %
HCT: 29.4 % — ABNORMAL LOW (ref 40.0–52.0)
Hemoglobin: 10.4 g/dL — ABNORMAL LOW (ref 13.0–18.0)
Lymphocytes Relative: 10 %
Lymphs Abs: 0.5 10*3/uL — ABNORMAL LOW (ref 1.0–3.6)
MCH: 32 pg (ref 26.0–34.0)
MCHC: 35.3 g/dL (ref 32.0–36.0)
MCV: 90.7 fL (ref 80.0–100.0)
Monocytes Absolute: 0.2 10*3/uL (ref 0.2–1.0)
Monocytes Relative: 5 %
Neutro Abs: 4.4 10*3/uL (ref 1.4–6.5)
Neutrophils Relative %: 83 %
PLATELETS: 86 10*3/uL — AB (ref 150–440)
RBC: 3.25 MIL/uL — AB (ref 4.40–5.90)
RDW: 15.9 % — ABNORMAL HIGH (ref 11.5–14.5)
WBC: 5.3 10*3/uL (ref 3.8–10.6)

## 2016-06-26 MED ORDER — PACLITAXEL CHEMO INJECTION 300 MG/50ML
45.0000 mg/m2 | Freq: Once | INTRAVENOUS | Status: AC
  Administered 2016-06-26: 96 mg via INTRAVENOUS
  Filled 2016-06-26: qty 16

## 2016-06-26 MED ORDER — CARBOPLATIN CHEMO INJECTION 450 MG/45ML
181.4000 mg | Freq: Once | INTRAVENOUS | Status: AC
  Administered 2016-06-26: 180 mg via INTRAVENOUS
  Filled 2016-06-26: qty 18

## 2016-06-26 MED ORDER — SODIUM CHLORIDE 0.9 % IV SOLN
20.0000 mg | Freq: Once | INTRAVENOUS | Status: AC
  Administered 2016-06-26: 20 mg via INTRAVENOUS
  Filled 2016-06-26: qty 2

## 2016-06-26 MED ORDER — PALONOSETRON HCL INJECTION 0.25 MG/5ML
0.2500 mg | Freq: Once | INTRAVENOUS | Status: AC
  Administered 2016-06-26: 0.25 mg via INTRAVENOUS
  Filled 2016-06-26: qty 5

## 2016-06-26 MED ORDER — DIPHENHYDRAMINE HCL 50 MG/ML IJ SOLN
50.0000 mg | Freq: Once | INTRAMUSCULAR | Status: AC
  Administered 2016-06-26: 50 mg via INTRAVENOUS
  Filled 2016-06-26: qty 1

## 2016-06-26 MED ORDER — SODIUM CHLORIDE 0.9 % IV SOLN
Freq: Once | INTRAVENOUS | Status: AC
  Administered 2016-06-26: 09:00:00 via INTRAVENOUS
  Filled 2016-06-26: qty 1000

## 2016-06-26 MED ORDER — FAMOTIDINE IN NACL 20-0.9 MG/50ML-% IV SOLN
20.0000 mg | Freq: Once | INTRAVENOUS | Status: AC
  Administered 2016-06-26: 20 mg via INTRAVENOUS
  Filled 2016-06-26: qty 50

## 2016-06-26 NOTE — Progress Notes (Signed)
Dr. Rogue Bussing aware of plts 86.  Order to continue with treatment for today.

## 2016-06-27 ENCOUNTER — Ambulatory Visit
Admission: RE | Admit: 2016-06-27 | Discharge: 2016-06-27 | Disposition: A | Discharge status: Home / Self Care | Payer: Medicare HMO | Source: Ambulatory Visit | Attending: Radiation Oncology | Admitting: Radiation Oncology

## 2016-06-27 DIAGNOSIS — J8410 Pulmonary fibrosis, unspecified: Secondary | ICD-10-CM | POA: Diagnosis not present

## 2016-06-27 DIAGNOSIS — J439 Emphysema, unspecified: Secondary | ICD-10-CM | POA: Diagnosis not present

## 2016-06-27 DIAGNOSIS — E785 Hyperlipidemia, unspecified: Secondary | ICD-10-CM | POA: Diagnosis not present

## 2016-06-27 DIAGNOSIS — I2510 Atherosclerotic heart disease of native coronary artery without angina pectoris: Secondary | ICD-10-CM | POA: Diagnosis not present

## 2016-06-27 DIAGNOSIS — Z7982 Long term (current) use of aspirin: Secondary | ICD-10-CM | POA: Diagnosis not present

## 2016-06-27 DIAGNOSIS — C3412 Malignant neoplasm of upper lobe, left bronchus or lung: Secondary | ICD-10-CM | POA: Diagnosis not present

## 2016-06-27 DIAGNOSIS — R7301 Impaired fasting glucose: Secondary | ICD-10-CM | POA: Diagnosis not present

## 2016-06-27 DIAGNOSIS — Z510 Encounter for antineoplastic radiation therapy: Secondary | ICD-10-CM | POA: Diagnosis not present

## 2016-06-27 DIAGNOSIS — M818 Other osteoporosis without current pathological fracture: Secondary | ICD-10-CM | POA: Diagnosis not present

## 2016-06-27 DIAGNOSIS — R0602 Shortness of breath: Secondary | ICD-10-CM | POA: Diagnosis not present

## 2016-06-28 ENCOUNTER — Ambulatory Visit
Admission: RE | Admit: 2016-06-28 | Discharge: 2016-06-28 | Disposition: A | Discharge status: Home / Self Care | Payer: Medicare HMO | Source: Ambulatory Visit | Attending: Radiation Oncology | Admitting: Radiation Oncology

## 2016-06-28 DIAGNOSIS — E785 Hyperlipidemia, unspecified: Secondary | ICD-10-CM | POA: Diagnosis not present

## 2016-06-28 DIAGNOSIS — R0602 Shortness of breath: Secondary | ICD-10-CM | POA: Diagnosis not present

## 2016-06-28 DIAGNOSIS — C3412 Malignant neoplasm of upper lobe, left bronchus or lung: Secondary | ICD-10-CM | POA: Diagnosis not present

## 2016-06-28 DIAGNOSIS — J8410 Pulmonary fibrosis, unspecified: Secondary | ICD-10-CM | POA: Diagnosis not present

## 2016-06-28 DIAGNOSIS — Z510 Encounter for antineoplastic radiation therapy: Secondary | ICD-10-CM | POA: Diagnosis not present

## 2016-06-28 DIAGNOSIS — J439 Emphysema, unspecified: Secondary | ICD-10-CM | POA: Diagnosis not present

## 2016-06-28 DIAGNOSIS — I2510 Atherosclerotic heart disease of native coronary artery without angina pectoris: Secondary | ICD-10-CM | POA: Diagnosis not present

## 2016-06-28 DIAGNOSIS — M818 Other osteoporosis without current pathological fracture: Secondary | ICD-10-CM | POA: Diagnosis not present

## 2016-06-28 DIAGNOSIS — Z7982 Long term (current) use of aspirin: Secondary | ICD-10-CM | POA: Diagnosis not present

## 2016-06-28 DIAGNOSIS — R7301 Impaired fasting glucose: Secondary | ICD-10-CM | POA: Diagnosis not present

## 2016-07-01 ENCOUNTER — Ambulatory Visit
Admission: RE | Admit: 2016-07-01 | Discharge: 2016-07-01 | Disposition: A | Discharge status: Home / Self Care | Payer: Medicare HMO | Source: Ambulatory Visit | Attending: Radiation Oncology | Admitting: Radiation Oncology

## 2016-07-01 ENCOUNTER — Inpatient Hospital Stay: Payer: Medicare HMO

## 2016-07-01 ENCOUNTER — Ambulatory Visit: Payer: Medicare HMO

## 2016-07-01 DIAGNOSIS — J439 Emphysema, unspecified: Secondary | ICD-10-CM | POA: Diagnosis not present

## 2016-07-01 DIAGNOSIS — I2510 Atherosclerotic heart disease of native coronary artery without angina pectoris: Secondary | ICD-10-CM | POA: Diagnosis not present

## 2016-07-01 DIAGNOSIS — R0602 Shortness of breath: Secondary | ICD-10-CM | POA: Diagnosis not present

## 2016-07-01 DIAGNOSIS — R7301 Impaired fasting glucose: Secondary | ICD-10-CM | POA: Diagnosis not present

## 2016-07-01 DIAGNOSIS — C3412 Malignant neoplasm of upper lobe, left bronchus or lung: Secondary | ICD-10-CM | POA: Diagnosis not present

## 2016-07-01 DIAGNOSIS — E785 Hyperlipidemia, unspecified: Secondary | ICD-10-CM | POA: Diagnosis not present

## 2016-07-01 DIAGNOSIS — M818 Other osteoporosis without current pathological fracture: Secondary | ICD-10-CM | POA: Diagnosis not present

## 2016-07-01 DIAGNOSIS — Z7982 Long term (current) use of aspirin: Secondary | ICD-10-CM | POA: Diagnosis not present

## 2016-07-01 DIAGNOSIS — Z510 Encounter for antineoplastic radiation therapy: Secondary | ICD-10-CM | POA: Diagnosis not present

## 2016-07-01 DIAGNOSIS — J8410 Pulmonary fibrosis, unspecified: Secondary | ICD-10-CM | POA: Diagnosis not present

## 2016-07-01 NOTE — Progress Notes (Signed)
Nutrition  Message received that daughter called to say that patient was eating much better and will not be coming to nutrition appointment today.   RD available as needed.  Heyden Jaber B. Zenia Resides, [place], [place] Registered Dietitian [phone] (pager)

## 2016-07-02 ENCOUNTER — Ambulatory Visit: Payer: Medicare HMO

## 2016-07-02 ENCOUNTER — Ambulatory Visit
Admission: RE | Admit: 2016-07-02 | Discharge: 2016-07-02 | Disposition: A | Discharge status: Home / Self Care | Payer: Medicare HMO | Source: Ambulatory Visit | Attending: Radiation Oncology | Admitting: Radiation Oncology

## 2016-07-02 DIAGNOSIS — R0602 Shortness of breath: Secondary | ICD-10-CM | POA: Diagnosis not present

## 2016-07-02 DIAGNOSIS — R7301 Impaired fasting glucose: Secondary | ICD-10-CM | POA: Diagnosis not present

## 2016-07-02 DIAGNOSIS — J439 Emphysema, unspecified: Secondary | ICD-10-CM | POA: Diagnosis not present

## 2016-07-02 DIAGNOSIS — J8410 Pulmonary fibrosis, unspecified: Secondary | ICD-10-CM | POA: Diagnosis not present

## 2016-07-02 DIAGNOSIS — Z7982 Long term (current) use of aspirin: Secondary | ICD-10-CM | POA: Diagnosis not present

## 2016-07-02 DIAGNOSIS — Z510 Encounter for antineoplastic radiation therapy: Secondary | ICD-10-CM | POA: Diagnosis not present

## 2016-07-02 DIAGNOSIS — I2510 Atherosclerotic heart disease of native coronary artery without angina pectoris: Secondary | ICD-10-CM | POA: Diagnosis not present

## 2016-07-02 DIAGNOSIS — E785 Hyperlipidemia, unspecified: Secondary | ICD-10-CM | POA: Diagnosis not present

## 2016-07-02 DIAGNOSIS — M818 Other osteoporosis without current pathological fracture: Secondary | ICD-10-CM | POA: Diagnosis not present

## 2016-07-02 DIAGNOSIS — C3412 Malignant neoplasm of upper lobe, left bronchus or lung: Secondary | ICD-10-CM | POA: Diagnosis not present

## 2016-07-03 ENCOUNTER — Inpatient Hospital Stay: Payer: Medicare HMO

## 2016-07-03 ENCOUNTER — Inpatient Hospital Stay (HOSPITAL_BASED_OUTPATIENT_CLINIC_OR_DEPARTMENT_OTHER): Payer: Medicare HMO | Admitting: Internal Medicine

## 2016-07-03 ENCOUNTER — Ambulatory Visit
Admission: RE | Admit: 2016-07-03 | Discharge: 2016-07-03 | Disposition: A | Discharge status: Home / Self Care | Payer: Medicare HMO | Source: Ambulatory Visit | Attending: Radiation Oncology | Admitting: Radiation Oncology

## 2016-07-03 ENCOUNTER — Ambulatory Visit: Payer: Medicare HMO

## 2016-07-03 VITALS — BP 97/58 | HR 89 | Temp 97.9°F | Resp 16 | Wt 198.4 lb

## 2016-07-03 DIAGNOSIS — C3412 Malignant neoplasm of upper lobe, left bronchus or lung: Secondary | ICD-10-CM | POA: Diagnosis not present

## 2016-07-03 DIAGNOSIS — R05 Cough: Secondary | ICD-10-CM

## 2016-07-03 DIAGNOSIS — I700 Atherosclerosis of aorta: Secondary | ICD-10-CM

## 2016-07-03 DIAGNOSIS — E785 Hyperlipidemia, unspecified: Secondary | ICD-10-CM

## 2016-07-03 DIAGNOSIS — Z7982 Long term (current) use of aspirin: Secondary | ICD-10-CM | POA: Diagnosis not present

## 2016-07-03 DIAGNOSIS — Z5111 Encounter for antineoplastic chemotherapy: Secondary | ICD-10-CM | POA: Diagnosis not present

## 2016-07-03 DIAGNOSIS — R067 Sneezing: Secondary | ICD-10-CM | POA: Diagnosis not present

## 2016-07-03 DIAGNOSIS — R5383 Other fatigue: Secondary | ICD-10-CM | POA: Diagnosis not present

## 2016-07-03 DIAGNOSIS — R7301 Impaired fasting glucose: Secondary | ICD-10-CM | POA: Diagnosis not present

## 2016-07-03 DIAGNOSIS — R040 Epistaxis: Secondary | ICD-10-CM

## 2016-07-03 DIAGNOSIS — I2510 Atherosclerotic heart disease of native coronary artery without angina pectoris: Secondary | ICD-10-CM | POA: Diagnosis not present

## 2016-07-03 DIAGNOSIS — R481 Agnosia: Secondary | ICD-10-CM

## 2016-07-03 DIAGNOSIS — Z77090 Contact with and (suspected) exposure to asbestos: Secondary | ICD-10-CM

## 2016-07-03 DIAGNOSIS — R630 Anorexia: Secondary | ICD-10-CM

## 2016-07-03 DIAGNOSIS — D696 Thrombocytopenia, unspecified: Secondary | ICD-10-CM | POA: Diagnosis not present

## 2016-07-03 DIAGNOSIS — I6523 Occlusion and stenosis of bilateral carotid arteries: Secondary | ICD-10-CM

## 2016-07-03 DIAGNOSIS — R634 Abnormal weight loss: Secondary | ICD-10-CM

## 2016-07-03 DIAGNOSIS — I129 Hypertensive chronic kidney disease with stage 1 through stage 4 chronic kidney disease, or unspecified chronic kidney disease: Secondary | ICD-10-CM

## 2016-07-03 DIAGNOSIS — I517 Cardiomegaly: Secondary | ICD-10-CM

## 2016-07-03 DIAGNOSIS — Z87891 Personal history of nicotine dependence: Secondary | ICD-10-CM

## 2016-07-03 DIAGNOSIS — M810 Age-related osteoporosis without current pathological fracture: Secondary | ICD-10-CM

## 2016-07-03 DIAGNOSIS — N183 Chronic kidney disease, stage 3 (moderate): Secondary | ICD-10-CM

## 2016-07-03 DIAGNOSIS — Z510 Encounter for antineoplastic radiation therapy: Secondary | ICD-10-CM | POA: Diagnosis not present

## 2016-07-03 DIAGNOSIS — J8410 Pulmonary fibrosis, unspecified: Secondary | ICD-10-CM | POA: Diagnosis not present

## 2016-07-03 DIAGNOSIS — I119 Hypertensive heart disease without heart failure: Secondary | ICD-10-CM

## 2016-07-03 DIAGNOSIS — Z79899 Other long term (current) drug therapy: Secondary | ICD-10-CM

## 2016-07-03 DIAGNOSIS — Z7952 Long term (current) use of systemic steroids: Secondary | ICD-10-CM

## 2016-07-03 DIAGNOSIS — R0602 Shortness of breath: Secondary | ICD-10-CM | POA: Diagnosis not present

## 2016-07-03 DIAGNOSIS — J439 Emphysema, unspecified: Secondary | ICD-10-CM

## 2016-07-03 DIAGNOSIS — M818 Other osteoporosis without current pathological fracture: Secondary | ICD-10-CM | POA: Diagnosis not present

## 2016-07-03 LAB — CBC WITH DIFFERENTIAL/PLATELET
BASOS ABS: 0 10*3/uL (ref 0–0.1)
BASOS PCT: 1 %
EOS ABS: 0 10*3/uL (ref 0–0.7)
EOS PCT: 2 %
HCT: 28.5 % — ABNORMAL LOW (ref 40.0–52.0)
Hemoglobin: 10.1 g/dL — ABNORMAL LOW (ref 13.0–18.0)
LYMPHS PCT: 15 %
Lymphs Abs: 0.4 10*3/uL — ABNORMAL LOW (ref 1.0–3.6)
MCH: 32.2 pg (ref 26.0–34.0)
MCHC: 35.4 g/dL (ref 32.0–36.0)
MCV: 90.8 fL (ref 80.0–100.0)
MONO ABS: 0.2 10*3/uL (ref 0.2–1.0)
Monocytes Relative: 9 %
Neutro Abs: 1.8 10*3/uL (ref 1.4–6.5)
Neutrophils Relative %: 73 %
PLATELETS: 70 10*3/uL — AB (ref 150–440)
RBC: 3.13 MIL/uL — ABNORMAL LOW (ref 4.40–5.90)
RDW: 16.8 % — AB (ref 11.5–14.5)
WBC: 2.5 10*3/uL — ABNORMAL LOW (ref 3.8–10.6)

## 2016-07-03 LAB — COMPREHENSIVE METABOLIC PANEL
ALT: 16 U/L — AB (ref 17–63)
AST: 21 U/L (ref 15–41)
Albumin: 3.8 g/dL (ref 3.5–5.0)
Alkaline Phosphatase: 57 U/L (ref 38–126)
Anion gap: 6 (ref 5–15)
BUN: 24 mg/dL — AB (ref 6–20)
CHLORIDE: 103 mmol/L (ref 101–111)
CO2: 26 mmol/L (ref 22–32)
CREATININE: 1.13 mg/dL (ref 0.61–1.24)
Calcium: 8.8 mg/dL — ABNORMAL LOW (ref 8.9–10.3)
GFR calc Af Amer: >60 mL/min (ref >60)
GFR calc non Af Amer: >60 mL/min (ref >60)
Glucose, Bld: 113 mg/dL — ABNORMAL HIGH (ref 65–99)
POTASSIUM: 3.6 mmol/L (ref 3.5–5.1)
SODIUM: 135 mmol/L (ref 135–145)
Total Bilirubin: 0.7 mg/dL (ref 0.3–1.2)
Total Protein: 7.9 g/dL (ref 6.5–8.1)

## 2016-07-03 MED ORDER — SODIUM CHLORIDE 0.9 % IV SOLN
20.0000 mg | Freq: Once | INTRAVENOUS | Status: AC
  Administered 2016-07-03: 20 mg via INTRAVENOUS
  Filled 2016-07-03: qty 2

## 2016-07-03 MED ORDER — DIPHENHYDRAMINE HCL 50 MG/ML IJ SOLN
50.0000 mg | Freq: Once | INTRAMUSCULAR | Status: AC
  Administered 2016-07-03: 50 mg via INTRAVENOUS
  Filled 2016-07-03: qty 1

## 2016-07-03 MED ORDER — SODIUM CHLORIDE 0.9 % IV SOLN
180.0000 mg | Freq: Once | INTRAVENOUS | Status: AC
  Administered 2016-07-03: 180 mg via INTRAVENOUS
  Filled 2016-07-03: qty 18

## 2016-07-03 MED ORDER — PREDNISONE 20 MG PO TABS: 20.0000 mg | ORAL_TABLET | Freq: Every day | ORAL | 0 refills | Status: DC

## 2016-07-03 MED ORDER — SODIUM CHLORIDE 0.9 % IV SOLN
45.0000 mg/m2 | Freq: Once | INTRAVENOUS | Status: AC
  Administered 2016-07-03: 96 mg via INTRAVENOUS
  Filled 2016-07-03: qty 16

## 2016-07-03 MED ORDER — PALONOSETRON HCL INJECTION 0.25 MG/5ML
0.2500 mg | Freq: Once | INTRAVENOUS | Status: AC
  Administered 2016-07-03: 0.25 mg via INTRAVENOUS
  Filled 2016-07-03: qty 5

## 2016-07-03 MED ORDER — SODIUM CHLORIDE 0.9 % IV SOLN
Freq: Once | INTRAVENOUS | Status: AC
  Administered 2016-07-03: 10:00:00 via INTRAVENOUS
  Filled 2016-07-03: qty 1000

## 2016-07-03 MED ORDER — FAMOTIDINE IN NACL 20-0.9 MG/50ML-% IV SOLN
20.0000 mg | Freq: Once | INTRAVENOUS | Status: AC
  Administered 2016-07-03: 20 mg via INTRAVENOUS
  Filled 2016-07-03: qty 50

## 2016-07-03 NOTE — Progress Notes (Signed)
Proceed with treatment today per Dr. Rogue Bussing. LJ

## 2016-07-03 NOTE — Assessment & Plan Note (Addendum)
#   Adenocarcinoma LUL- T3N0 [however non-PET avid mediastinal LN; LLL infiltrative changes].  Currently on definitive concurrent chemo- with RT.  # Proceed with carbo-taxol today [#5]- with RT; Labs today reviewed; platelets- 72;  acceptable for treatment today    # Mild thrombocytopenia- 72/ sec to chemo  ITP vs others- monitor for now.   # Fatigue- start prednsone 20 mg/day.   # follow up in 2 weeks/ labs/chemo; labs 1 week; will decide on chemo the following week. Discussed the patient's daughter at length. Patient will need 2 doses of carbo-taxol.

## 2016-07-03 NOTE — Progress Notes (Signed)
Patient here today for follow up.   

## 2016-07-03 NOTE — Progress Notes (Signed)
Plts are 70. MD aware and wants to proceed with treatement.

## 2016-07-03 NOTE — Progress Notes (Signed)
[place] NOTE  Patient Care Team: Kathrine Haddock, NP as PCP - General (Nurse Practitioner) Minna Merritts, MD as Consulting Physician (Cardiology)  CHIEF COMPLAINTS/PURPOSE OF CONSULTATION:  Oncology History   # FEB 2018- [place] CA LUL [CT Bx] T3N0 [abutting mediastinum; Mediastinal LN on CT; Neg on PET]; NOT surgical candidate [Dr.Oaks/ Dr.Henderickson; [place]];   # Carbo-Taxol RT [[facility]]  # Mild Thrombocytopenia [130s?]  # MILD THROMBOCYTOPENIA [130-140s]; Feb 2018- MR brain-NEG  # Bil Carotid stenosis [R>90% s/p CEA on 05/22/16; L~50%; Dr.Schneir].   # MOL Testing- ROS-1;ALK/EGFR-NEG.      Primary cancer of left upper lobe of lung ([place])   04/24/2016 Initial Diagnosis    Primary cancer of left upper lobe of lung (H[facility])       HISTORY OF PRESENTING ILLNESS:  Marcus Cortez 71 y.o.  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 Patient is finishing up radiation tomorrow.  Patient overall is doing well except for continued fatigue. His appetite is fair at best. No weight loss. He notices bloody nose and sneezing. Otherwise no frank blood or any kind of you think you  Continues to have Chronic cough. Mild shortness of breath.  Denies any nausea vomiting. Denies any pain.No tingling or numbness. He states he been drinking enough fluids.   ROS: A complete 10 point review of system is done which is negative except mentioned above in history of present illness  MEDICAL HISTORY:  Past Medical History:  Diagnosis Date  . Allergy   . Cancer ([place]) 03/2016   lung  . Carotid artery bruit   . CINV (chemotherapy-induced nausea and vomiting)   . Emphysema lung ([place])   . Emphysema of lung ([place])   . Former smoker   . Heart murmur   . Hyperlipidemia   . Hypertension   . IFG (impaired fasting glucose)   . Osteoporosis   . Pulmonary stenosis     SURGICAL HISTORY: Past  Surgical History:  Procedure Laterality Date  . CAROTID PTA/STENT INTERVENTION Right 05/22/2016   Procedure: Carotid PTA/Stent Intervention;  Surgeon: Katha Cabal, MD;  Location: [facility];  Service: Cardiovascular;  Laterality: Right;  . EYE SURGERY  2010   cataract  . 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 Previous history of smoking half a pack a day for 25-30 years. Quit approximately 2 years ago. Social History   Social History  . Marital status: Widowed    Spouse name: N/A  . Number of children: N/A  . Years of education: N/A   Occupational History  . Not on file.   Social History Main Topics  . Smoking status: Former Smoker    Packs/day: 0.50    Years: 35.00    Types: Cigarettes    Quit date: 01/10/2015  . Smokeless tobacco: Former Systems developer    Types: Chew    Quit date: 04/10/1978  . Alcohol use No  . Drug use: No  . Sexual activity: Yes   Other Topics Concern  . Not on file   Social History Narrative  . No narrative on file    FAMILY HISTORY: Family History  Problem Relation Age of Onset  . Hypertension Mother   . Thyroid disease Mother   .  Glaucoma Mother   . Alzheimer's disease Father   . Stroke Maternal Grandmother   . Cancer Maternal Grandfather   . Alzheimer's disease Paternal Grandmother   . Stroke Paternal Grandfather     ALLERGIES:  has No Known Allergies.  MEDICATIONS:  Current Outpatient Prescriptions  Medication Sig Dispense Refill  . acetaminophen (TYLENOL) 325 MG tablet Take 325-650 mg by mouth every 6 (six) hours as needed (for pain/headache.).     Marland Kitchen amLODipine (NORVASC) 10 MG tablet Take 1 tablet (10 mg total) by mouth daily. 90 tablet 3  . aspirin 81 MG tablet Take 81 mg by mouth daily. TAKE 3-4 TIMES A WEEK    .  chlorpheniramine-HYDROcodone (TUSSIONEX) 10-8 MG/5ML SUER Take 5 mLs by mouth every 12 (twelve) hours as needed for cough. 140 mL 0  . clopidogrel (PLAVIX) 75 MG tablet Take 1 tablet (75 mg total) by mouth daily with breakfast. 30 tablet 6  . ezetimibe (ZETIA) 10 MG tablet Take 1 tablet (10 mg total) by mouth daily. 30 tablet 11  . gemfibrozil (LOPID) 600 MG tablet Take 600 mg by mouth 2 (two) times daily.     Marland Kitchen losartan (COZAAR) 100 MG tablet Take 1 tablet (100 mg total) by mouth daily. (Patient taking differently: Take 50 mg by mouth daily. BASED ON BLOOD PRESSURE READINGS) 30 tablet 11  . ondansetron (ZOFRAN) 8 MG tablet Take 1 tablet (8 mg total) by mouth every 8 (eight) hours as needed for nausea or vomiting (start 3 days; after chemo). 40 tablet 1  . prochlorperazine (COMPAZINE) 10 MG tablet Take 1 tablet (10 mg total) by mouth every 6 (six) hours as needed for nausea or vomiting. 40 tablet 1  . sucralfate (CARAFATE) 1 g tablet Take 1 tablet (1 g total) by mouth 3 (three) times daily. Dissolve in 2-3 tbsp warm water, swish and swallow 90 tablet 3  . predniSONE (DELTASONE) 20 MG tablet Take 1 tablet (20 mg total) by mouth daily with breakfast. Once a day with food 30 tablet 0   No current facility-administered medications for this visit.    Facility-Administered Medications Ordered in Other Visits  Medication Dose Route Frequency Provider Last Rate Last Dose  . CARBOplatin (PARAPLATIN) 180 mg in sodium chloride 0.9 % 250 mL chemo infusion  180 mg Intravenous Once Cammie Sickle, MD      . dexamethasone (DECADRON) 20 mg in sodium chloride 0.9 % 50 mL IVPB  20 mg Intravenous Once Cammie Sickle, MD   20 mg at 07/03/16 1051  . PACLitaxel (TAXOL) 96 mg in sodium chloride 0.9 % 250 mL chemo infusion (</= 28m/m2)  45 mg/m2 (Treatment Plan Recorded) Intravenous Once GCammie Sickle MD          .  PHYSICAL EXAMINATION: ECOG PERFORMANCE STATUS: 0 - Asymptomatic  Vitals:    07/03/16 0925  BP: (!) 97/58  Pulse: 89  Resp: 16  Temp: 97.9 F (36.6 C)   Filed Weights   07/03/16 0925  Weight: 198 lb 6 oz (90 kg)    GENERAL: Well-nourished well-developed; Alert, no distress and comfortable.   With His daughter.  EYES: no pallor or icterus OROPHARYNX: no thrush or ulceration; good dentition  NECK: supple, no masses felt LYMPH:  no palpable lymphadenopathy in the cervical, axillary or inguinal regions LUNGS: Decreased breath sounds to auscultation on left side and  No wheeze or crackles HEART/CVS: regular rate & rhythm and no murmurs; No lower extremity edema ABDOMEN: abdomen soft, non-tender  and normal bowel sounds Musculoskeletal:no cyanosis of digits and no clubbing  PSYCH: alert & oriented x 3 with fluent speech NEURO: no focal motor/sensory deficits SKIN:  no rashes or significant lesions.   LABORATORY DATA:  I have reviewed the data as listed Lab Results  Component Value Date   WBC 2.5 (L) 07/03/2016   HGB 10.1 (L) 07/03/2016   HCT 28.5 (L) 07/03/2016   MCV 90.8 07/03/2016   PLT 70 (L) 07/03/2016    Recent Labs  06/12/16 0858 06/19/16 0833 07/03/16 0850  NA 135 133* 135  K 3.4* 4.1 3.6  CL 104 101 103  CO2 _0 GLUCOSE 91 92 113*  BUN 23* 22* 24*  CREATININE 1.31* 1.28* 1.13  CALCIUM 8.3* 8.7* 8.8*  GFRNONAA 53* 54* >60  GFRAA >60 >60 >60  PROT 7.6 8.2* 7.9  ALBUMIN 3.4* 3.4* 3.8  AST _1 ALT 14* 22 16*  ALKPHOS 44 57 57  BILITOT 0.6 0.6 0.7    RADIOGRAPHIC STUDIES: I have personally reviewed the radiological images as listed and agreed with the findings in the report. Dg Chest 2 View  Result Date: 06/12/2016 CLINICAL DATA:  Productive cough over the last month. Fever. Lung cancer being treated with radiation and chemotherapy. EXAM: CHEST  2 VIEW COMPARISON:  04/23/2016.  03/21/2016. FINDINGS: Chronic cardiomegaly. Chronic aortic atherosclerosis. Chronic pulmonary fibrotic pattern. No active process seen on the  right. On the left, left hilar region mass and left lower lobe volume loss/ infiltrate. Similar appearance to the previous study, with slight worsening of the left lower lobe density. IMPRESSION: Left hilar region mass and volume loss appears similar to the previous study. Some worsening of left lower lobe density that could be due to atelectasis or pneumonia. Electronically Signed   By: Nelson Chimes M.D.   On: 06/12/2016 11:25    ASSESSMENT & PLAN:   Primary cancer of left upper lobe of lung ([address]) # Adenocarcinoma LUL- T3N0 [however non-PET avid mediastinal LN; LLL infiltrative changes].  Currently on definitive concurrent chemo- with RT.  # Proceed with carbo-taxol today [#5]- with RT; Labs today reviewed; platelets- 72;  acceptable for treatment today    # Mild thrombocytopenia- 72/ sec to chemo  ITP vs others- monitor for now.   # Fatigue- start prednsone 20 mg/day.   # follow up in 2 weeks/ labs/chemo; labs 1 week; will decide on chemo the following week. Discussed the patient's daughter at length. Patient will need 2 doses of carbo-taxol.     Cammie Sickle, MD 07/03/2016 10:58 AM

## 2016-07-04 ENCOUNTER — Ambulatory Visit
Admission: RE | Admit: 2016-07-04 | Discharge: 2016-07-04 | Disposition: A | Discharge status: Home / Self Care | Payer: Medicare HMO | Source: Ambulatory Visit | Attending: Radiation Oncology | Admitting: Radiation Oncology

## 2016-07-04 ENCOUNTER — Ambulatory Visit: Payer: Medicare HMO

## 2016-07-04 DIAGNOSIS — J8410 Pulmonary fibrosis, unspecified: Secondary | ICD-10-CM | POA: Diagnosis not present

## 2016-07-04 DIAGNOSIS — Z510 Encounter for antineoplastic radiation therapy: Secondary | ICD-10-CM | POA: Diagnosis not present

## 2016-07-04 DIAGNOSIS — M818 Other osteoporosis without current pathological fracture: Secondary | ICD-10-CM | POA: Diagnosis not present

## 2016-07-04 DIAGNOSIS — J439 Emphysema, unspecified: Secondary | ICD-10-CM | POA: Diagnosis not present

## 2016-07-04 DIAGNOSIS — E785 Hyperlipidemia, unspecified: Secondary | ICD-10-CM | POA: Diagnosis not present

## 2016-07-04 DIAGNOSIS — Z7982 Long term (current) use of aspirin: Secondary | ICD-10-CM | POA: Diagnosis not present

## 2016-07-04 DIAGNOSIS — R0602 Shortness of breath: Secondary | ICD-10-CM | POA: Diagnosis not present

## 2016-07-04 DIAGNOSIS — I2510 Atherosclerotic heart disease of native coronary artery without angina pectoris: Secondary | ICD-10-CM | POA: Diagnosis not present

## 2016-07-04 DIAGNOSIS — C3412 Malignant neoplasm of upper lobe, left bronchus or lung: Secondary | ICD-10-CM | POA: Diagnosis not present

## 2016-07-04 DIAGNOSIS — R7301 Impaired fasting glucose: Secondary | ICD-10-CM | POA: Diagnosis not present

## 2016-07-10 ENCOUNTER — Inpatient Hospital Stay: Payer: Medicare HMO | Attending: Internal Medicine

## 2016-07-10 DIAGNOSIS — Z7952 Long term (current) use of systemic steroids: Secondary | ICD-10-CM | POA: Insufficient documentation

## 2016-07-10 DIAGNOSIS — R5383 Other fatigue: Secondary | ICD-10-CM | POA: Insufficient documentation

## 2016-07-10 DIAGNOSIS — I10 Essential (primary) hypertension: Secondary | ICD-10-CM | POA: Insufficient documentation

## 2016-07-10 DIAGNOSIS — Z923 Personal history of irradiation: Secondary | ICD-10-CM | POA: Diagnosis not present

## 2016-07-10 DIAGNOSIS — D696 Thrombocytopenia, unspecified: Secondary | ICD-10-CM | POA: Diagnosis not present

## 2016-07-10 DIAGNOSIS — M810 Age-related osteoporosis without current pathological fracture: Secondary | ICD-10-CM | POA: Diagnosis not present

## 2016-07-10 DIAGNOSIS — R630 Anorexia: Secondary | ICD-10-CM | POA: Insufficient documentation

## 2016-07-10 DIAGNOSIS — Z9221 Personal history of antineoplastic chemotherapy: Secondary | ICD-10-CM | POA: Insufficient documentation

## 2016-07-10 DIAGNOSIS — Z9119 Patient's noncompliance with other medical treatment and regimen: Secondary | ICD-10-CM | POA: Diagnosis not present

## 2016-07-10 DIAGNOSIS — N183 Chronic kidney disease, stage 3 (moderate): Secondary | ICD-10-CM | POA: Insufficient documentation

## 2016-07-10 DIAGNOSIS — R531 Weakness: Secondary | ICD-10-CM | POA: Insufficient documentation

## 2016-07-10 DIAGNOSIS — J439 Emphysema, unspecified: Secondary | ICD-10-CM | POA: Diagnosis not present

## 2016-07-10 DIAGNOSIS — R040 Epistaxis: Secondary | ICD-10-CM | POA: Insufficient documentation

## 2016-07-10 DIAGNOSIS — C3412 Malignant neoplasm of upper lobe, left bronchus or lung: Secondary | ICD-10-CM | POA: Diagnosis present

## 2016-07-10 DIAGNOSIS — Z792 Long term (current) use of antibiotics: Secondary | ICD-10-CM | POA: Insufficient documentation

## 2016-07-10 DIAGNOSIS — R110 Nausea: Secondary | ICD-10-CM | POA: Diagnosis not present

## 2016-07-10 DIAGNOSIS — Z7982 Long term (current) use of aspirin: Secondary | ICD-10-CM | POA: Insufficient documentation

## 2016-07-10 DIAGNOSIS — R0602 Shortness of breath: Secondary | ICD-10-CM | POA: Insufficient documentation

## 2016-07-10 DIAGNOSIS — R509 Fever, unspecified: Secondary | ICD-10-CM | POA: Diagnosis not present

## 2016-07-10 DIAGNOSIS — R634 Abnormal weight loss: Secondary | ICD-10-CM | POA: Diagnosis not present

## 2016-07-10 DIAGNOSIS — I129 Hypertensive chronic kidney disease with stage 1 through stage 4 chronic kidney disease, or unspecified chronic kidney disease: Secondary | ICD-10-CM | POA: Insufficient documentation

## 2016-07-10 DIAGNOSIS — H538 Other visual disturbances: Secondary | ICD-10-CM | POA: Diagnosis not present

## 2016-07-10 DIAGNOSIS — Z7902 Long term (current) use of antithrombotics/antiplatelets: Secondary | ICD-10-CM | POA: Diagnosis not present

## 2016-07-10 DIAGNOSIS — Z87891 Personal history of nicotine dependence: Secondary | ICD-10-CM | POA: Insufficient documentation

## 2016-07-10 DIAGNOSIS — E785 Hyperlipidemia, unspecified: Secondary | ICD-10-CM | POA: Insufficient documentation

## 2016-07-10 DIAGNOSIS — I6523 Occlusion and stenosis of bilateral carotid arteries: Secondary | ICD-10-CM | POA: Insufficient documentation

## 2016-07-10 DIAGNOSIS — Z79899 Other long term (current) drug therapy: Secondary | ICD-10-CM | POA: Insufficient documentation

## 2016-07-10 DIAGNOSIS — N492 Inflammatory disorders of scrotum: Secondary | ICD-10-CM | POA: Diagnosis not present

## 2016-07-10 DIAGNOSIS — Z809 Family history of malignant neoplasm, unspecified: Secondary | ICD-10-CM | POA: Insufficient documentation

## 2016-07-10 LAB — BASIC METABOLIC PANEL
Anion gap: 4 — ABNORMAL LOW (ref 5–15)
BUN: 25 mg/dL — AB (ref 6–20)
CHLORIDE: 105 mmol/L (ref 101–111)
CO2: 26 mmol/L (ref 22–32)
Calcium: 8.9 mg/dL (ref 8.9–10.3)
Creatinine, Ser: 1.26 mg/dL — ABNORMAL HIGH (ref 0.61–1.24)
GFR calc Af Amer: >60 mL/min (ref >60)
GFR calc non Af Amer: 55 mL/min — ABNORMAL LOW (ref >60)
GLUCOSE: 124 mg/dL — AB (ref 65–99)
POTASSIUM: 3.5 mmol/L (ref 3.5–5.1)
Sodium: 135 mmol/L (ref 135–145)

## 2016-07-10 LAB — CBC WITH DIFFERENTIAL/PLATELET
Basophils Absolute: 0 10*3/uL (ref 0–0.1)
Basophils Relative: 1 %
Eosinophils Absolute: 0 10*3/uL (ref 0–0.7)
Eosinophils Relative: 1 %
HCT: 28.3 % — ABNORMAL LOW (ref 40.0–52.0)
Hemoglobin: 10.1 g/dL — ABNORMAL LOW (ref 13.0–18.0)
LYMPHS ABS: 0.3 10*3/uL — AB (ref 1.0–3.6)
LYMPHS PCT: 13 %
MCH: 33.1 pg (ref 26.0–34.0)
MCHC: 35.6 g/dL (ref 32.0–36.0)
MCV: 92.9 fL (ref 80.0–100.0)
MONO ABS: 0.3 10*3/uL (ref 0.2–1.0)
Monocytes Relative: 10 %
Neutro Abs: 2 10*3/uL (ref 1.4–6.5)
Neutrophils Relative %: 75 %
PLATELETS: 67 10*3/uL — AB (ref 150–440)
RBC: 3.05 MIL/uL — AB (ref 4.40–5.90)
RDW: 18.1 % — ABNORMAL HIGH (ref 11.5–14.5)
WBC: 2.6 10*3/uL — AB (ref 3.8–10.6)

## 2016-07-19 ENCOUNTER — Inpatient Hospital Stay (HOSPITAL_BASED_OUTPATIENT_CLINIC_OR_DEPARTMENT_OTHER): Payer: Medicare HMO | Admitting: Internal Medicine

## 2016-07-19 ENCOUNTER — Inpatient Hospital Stay: Payer: Medicare HMO

## 2016-07-19 ENCOUNTER — Telehealth: Payer: Self-pay | Admitting: *Deleted

## 2016-07-19 ENCOUNTER — Other Ambulatory Visit: Payer: Self-pay | Admitting: Internal Medicine

## 2016-07-19 VITALS — BP 105/69 | HR 92 | Temp 97.6°F | Resp 18 | Wt 196.4 lb

## 2016-07-19 DIAGNOSIS — M810 Age-related osteoporosis without current pathological fracture: Secondary | ICD-10-CM

## 2016-07-19 DIAGNOSIS — C3412 Malignant neoplasm of upper lobe, left bronchus or lung: Secondary | ICD-10-CM

## 2016-07-19 DIAGNOSIS — Z7952 Long term (current) use of systemic steroids: Secondary | ICD-10-CM

## 2016-07-19 DIAGNOSIS — R040 Epistaxis: Secondary | ICD-10-CM

## 2016-07-19 DIAGNOSIS — R5383 Other fatigue: Secondary | ICD-10-CM

## 2016-07-19 DIAGNOSIS — Z7982 Long term (current) use of aspirin: Secondary | ICD-10-CM

## 2016-07-19 DIAGNOSIS — J439 Emphysema, unspecified: Secondary | ICD-10-CM

## 2016-07-19 DIAGNOSIS — H538 Other visual disturbances: Secondary | ICD-10-CM

## 2016-07-19 DIAGNOSIS — Z923 Personal history of irradiation: Secondary | ICD-10-CM | POA: Diagnosis not present

## 2016-07-19 DIAGNOSIS — R509 Fever, unspecified: Secondary | ICD-10-CM | POA: Diagnosis not present

## 2016-07-19 DIAGNOSIS — Z9221 Personal history of antineoplastic chemotherapy: Secondary | ICD-10-CM | POA: Diagnosis not present

## 2016-07-19 DIAGNOSIS — I6523 Occlusion and stenosis of bilateral carotid arteries: Secondary | ICD-10-CM | POA: Diagnosis not present

## 2016-07-19 DIAGNOSIS — Z809 Family history of malignant neoplasm, unspecified: Secondary | ICD-10-CM

## 2016-07-19 DIAGNOSIS — I10 Essential (primary) hypertension: Secondary | ICD-10-CM | POA: Diagnosis not present

## 2016-07-19 DIAGNOSIS — R0602 Shortness of breath: Secondary | ICD-10-CM | POA: Diagnosis not present

## 2016-07-19 DIAGNOSIS — R531 Weakness: Secondary | ICD-10-CM | POA: Diagnosis not present

## 2016-07-19 DIAGNOSIS — E785 Hyperlipidemia, unspecified: Secondary | ICD-10-CM

## 2016-07-19 DIAGNOSIS — Z7902 Long term (current) use of antithrombotics/antiplatelets: Secondary | ICD-10-CM

## 2016-07-19 DIAGNOSIS — R110 Nausea: Secondary | ICD-10-CM | POA: Diagnosis not present

## 2016-07-19 DIAGNOSIS — Z79899 Other long term (current) drug therapy: Secondary | ICD-10-CM

## 2016-07-19 DIAGNOSIS — D696 Thrombocytopenia, unspecified: Secondary | ICD-10-CM

## 2016-07-19 DIAGNOSIS — N492 Inflammatory disorders of scrotum: Secondary | ICD-10-CM | POA: Diagnosis not present

## 2016-07-19 DIAGNOSIS — Z87891 Personal history of nicotine dependence: Secondary | ICD-10-CM

## 2016-07-19 LAB — CBC WITH DIFFERENTIAL/PLATELET
Basophils Absolute: 0 10*3/uL (ref 0–0.1)
Basophils Relative: 1 %
EOS PCT: 2 %
Eosinophils Absolute: 0.1 10*3/uL (ref 0–0.7)
HEMATOCRIT: 31 % — AB (ref 40.0–52.0)
Hemoglobin: 10.8 g/dL — ABNORMAL LOW (ref 13.0–18.0)
LYMPHS ABS: 0.4 10*3/uL — AB (ref 1.0–3.6)
LYMPHS PCT: 10 %
MCH: 32.8 pg (ref 26.0–34.0)
MCHC: 34.9 g/dL (ref 32.0–36.0)
MCV: 93.9 fL (ref 80.0–100.0)
MONO ABS: 0.6 10*3/uL (ref 0.2–1.0)
Monocytes Relative: 17 %
NEUTROS ABS: 2.5 10*3/uL (ref 1.4–6.5)
Neutrophils Relative %: 70 %
Platelets: 63 10*3/uL — ABNORMAL LOW (ref 150–440)
RBC: 3.31 MIL/uL — AB (ref 4.40–5.90)
RDW: 20.2 % — ABNORMAL HIGH (ref 11.5–14.5)
WBC: 3.5 10*3/uL — AB (ref 3.8–10.6)

## 2016-07-19 LAB — COMPREHENSIVE METABOLIC PANEL
ALBUMIN: 4.2 g/dL (ref 3.5–5.0)
ALT: 14 U/L — ABNORMAL LOW (ref 17–63)
ANION GAP: 5 (ref 5–15)
AST: 20 U/L (ref 15–41)
Alkaline Phosphatase: 58 U/L (ref 38–126)
BUN: 28 mg/dL — AB (ref 6–20)
CHLORIDE: 104 mmol/L (ref 101–111)
CO2: 26 mmol/L (ref 22–32)
Calcium: 9.2 mg/dL (ref 8.9–10.3)
Creatinine, Ser: 1.22 mg/dL (ref 0.61–1.24)
GFR calc Af Amer: >60 mL/min (ref >60)
GFR calc non Af Amer: 57 mL/min — ABNORMAL LOW (ref >60)
GLUCOSE: 109 mg/dL — AB (ref 65–99)
POTASSIUM: 3.3 mmol/L — AB (ref 3.5–5.1)
Sodium: 135 mmol/L (ref 135–145)
TOTAL PROTEIN: 7.8 g/dL (ref 6.5–8.1)
Total Bilirubin: 0.9 mg/dL (ref 0.3–1.2)

## 2016-07-19 MED ORDER — ALBUTEROL SULFATE HFA 108 (90 BASE) MCG/ACT IN AERS: 2.0000 | INHALATION_SPRAY | Freq: Four times a day (QID) | RESPIRATORY_TRACT | 2 refills | Status: DC | PRN

## 2016-07-19 MED ORDER — FLUTICASONE-SALMETEROL 500-50 MCG/DOSE IN AEPB: 1.0000 | INHALATION_SPRAY | Freq: Two times a day (BID) | RESPIRATORY_TRACT | 3 refills | Status: DC

## 2016-07-19 MED ORDER — PREDNISONE 20 MG PO TABS: ORAL_TABLET | ORAL | 0 refills | Status: DC

## 2016-07-19 NOTE — Assessment & Plan Note (Addendum)
#   Adenocarcinoma LUL- T4N1 [however non-PET avid mediastinal LN; LLL infiltrative changes].  Currently on definitive concurrent chemo- with RT [last 4/27].   # No obvious clinical evidence of progression. However given the shortness of breath- check CT scan. Given his thrombocytopenia- suspect intolerance to further chemotherapy. Recommend immunotherapy- consolidation. The goal of treatment is cure. Discussed the mechanism of action; and the potential side effects in detail.   # Mild thrombocytopenia- 72/ sec to chemo  ITP vs others- monitor for now.  # Increasing SOB- recommend albuterol; Advair. Prescribed.  # Floaters/visual disturbance- recent MRI of the brain negative for any metastatic disease. Recommend ophthalmologic evaluation.   # Nose bleeds- secondary to low platelets/ dryness mucosa.  # Follow up in 2 week/labs/CT scan prior; Durvalumab.

## 2016-07-19 NOTE — Progress Notes (Signed)
Patient here today for follow up.  Patient c/o vision changes in right eye, "black dots and blurriness".  Also c/o worsening shortness of breath, wanting to discuss if an inhaler could provide help

## 2016-07-19 NOTE — Progress Notes (Signed)
[place] NOTE  Patient Care Team: Kathrine Haddock, NP as PCP - General (Nurse Practitioner) Minna Merritts, MD as Consulting Physician (Cardiology)  CHIEF COMPLAINTS/PURPOSE OF CONSULTATION:  Oncology History   # FEB 2018- [place] CA LUL [CT Bx] T4N1 [abutting mediastinum; Mediastinal LN on CT; Neg on PET]; NOT surgical candidate [Dr.Oaks/ Dr.Henderickson; [place]];   # Carbo-Taxol RT [[facility]]  # Mild Thrombocytopenia [130s?]  # MILD THROMBOCYTOPENIA [130-140s]; Feb 2018- MR brain-NEG  # Bil Carotid stenosis [R>90% s/p CEA on 05/22/16; L~50%; Dr.Schneir].   # MOL Testing- ROS-1;ALK/EGFR/B-RAF; PDL-1- 1%.      Primary cancer of left upper lobe of lung (HCC)     HISTORY OF PRESENTING ILLNESS:  Marcus Cortez 71 y.o.  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 Currently status post chemotherapy- radiation approximately 3 weeks ago.  Patient complains of bloody nose especially on blowing.  He complains of worsening shortness of breath especially exertion. Complains of cough with phlegm especially the mornings. No hemoptysis. No weight loss.  Denies any nausea vomiting. Denies any pain.No tingling or numbness. He states he been drinking enough fluids. Complains of mild-to-moderate fatigue.  ROS: A complete 10 point review of system is done which is negative except mentioned above in history of present illness  MEDICAL HISTORY:  Past Medical History:  Diagnosis Date  . Allergy   . Cancer ([place]) 03/2016   lung  . Carotid artery bruit   . CINV (chemotherapy-induced nausea and vomiting)   . Emphysema lung ([place])   . Emphysema of lung ([place])   . Former smoker   . Heart murmur   . Hyperlipidemia   . Hypertension   . IFG (impaired fasting glucose)   . Osteoporosis   . Pulmonary stenosis     SURGICAL HISTORY: Past Surgical History:  Procedure Laterality Date  .  CAROTID PTA/STENT INTERVENTION Right 05/22/2016   Procedure: Carotid PTA/Stent Intervention;  Surgeon: Katha Cabal, MD;  Location: [place] [facility];  Service: Cardiovascular;  Laterality: Right;  . EYE SURGERY  2010   cataract  . 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 Previous history of smoking half a pack a day for 25-30 years. Quit approximately 2 years ago. Social History   Social History  . Marital status: Widowed    Spouse name: N/A  . Number of children: N/A  . Years of education: N/A   Occupational History  . Not on file.   Social History Main Topics  . Smoking status: Former Smoker    Packs/day: 0.50    Years: 35.00    Types: Cigarettes    Quit date: 01/10/2015  . Smokeless tobacco: Former Systems developer    Types: Chew    Quit date: 04/10/1978  . Alcohol use No  . Drug use: No  . Sexual activity: Yes   Other Topics Concern  . Not on file   Social History Narrative  . No narrative on file    FAMILY HISTORY: Family History  Problem Relation Age of Onset  . Hypertension Mother   . Thyroid disease Mother   . Glaucoma Mother   . Alzheimer's disease Father   . Stroke Maternal Grandmother   . Cancer Maternal Grandfather   .  Alzheimer's disease Paternal Grandmother   . Stroke Paternal Grandfather     ALLERGIES:  has No Known Allergies.  MEDICATIONS:  Current Outpatient Prescriptions  Medication Sig Dispense Refill  . acetaminophen (TYLENOL) 325 MG tablet Take 325-650 mg by mouth every 6 (six) hours as needed (for pain/headache.).     Marland Kitchen amLODipine (NORVASC) 10 MG tablet Take 1 tablet (10 mg total) by mouth daily. 90 tablet 3  . aspirin 81 MG tablet Take 81 mg by mouth daily. TAKE 3-4 TIMES A WEEK    . clopidogrel (PLAVIX) 75 MG tablet Take 1 tablet (75 mg  total) by mouth daily with breakfast. 30 tablet 6  . ezetimibe (ZETIA) 10 MG tablet Take 1 tablet (10 mg total) by mouth daily. 30 tablet 11  . gemfibrozil (LOPID) 600 MG tablet Take 600 mg by mouth 2 (two) times daily.     Marland Kitchen losartan (COZAAR) 100 MG tablet Take 1 tablet (100 mg total) by mouth daily. (Patient taking differently: Take 50 mg by mouth daily. BASED ON BLOOD PRESSURE READINGS) 30 tablet 11  . ondansetron (ZOFRAN) 8 MG tablet Take 1 tablet (8 mg total) by mouth every 8 (eight) hours as needed for nausea or vomiting (start 3 days; after chemo). 40 tablet 1  . predniSONE (DELTASONE) 20 MG tablet Take 3 pills a day in AM x 1 week; then once a day x 1 week; and the STOP. 60 tablet 0  . prochlorperazine (COMPAZINE) 10 MG tablet Take 1 tablet (10 mg total) by mouth every 6 (six) hours as needed for nausea or vomiting. 40 tablet 1  . sucralfate (CARAFATE) 1 g tablet Take 1 tablet (1 g total) by mouth 3 (three) times daily. Dissolve in 2-3 tbsp warm water, swish and swallow 90 tablet 3  . albuterol (PROVENTIL HFA;VENTOLIN HFA) 108 (90 Base) MCG/ACT inhaler Inhale 2 puffs into the lungs every 6 (six) hours as needed for wheezing or shortness of breath. 1 Inhaler 2  . chlorpheniramine-HYDROcodone (TUSSIONEX) 10-8 MG/5ML SUER Take 5 mLs by mouth every 12 (twelve) hours as needed for cough. 240 mL 0  . Fluticasone-Salmeterol (ADVAIR DISKUS) 500-50 MCG/DOSE AEPB Inhale 1 puff into the lungs 2 (two) times daily. 1 each 3   No current facility-administered medications for this visit.       Marland Kitchen  PHYSICAL EXAMINATION: ECOG PERFORMANCE STATUS: 0 - Asymptomatic  Vitals:   07/19/16 0916  BP: 105/69  Pulse: 92  Resp: 18  Temp: 97.6 F (36.4 C)   Filed Weights   07/19/16 0916  Weight: 196 lb 6 oz (89.1 kg)    GENERAL: Well-nourished well-developed; Alert, no distress and comfortable.   With His daughter.  EYES: no pallor or icterus OROPHARYNX: no thrush or ulceration; good dentition  NECK:  supple, no masses felt LYMPH:  no palpable lymphadenopathy in the cervical, axillary or inguinal regions LUNGS: Decreased breath sounds to auscultation on left side and  No wheeze or crackles HEART/CVS: regular rate & rhythm and no murmurs; No lower extremity edema ABDOMEN: abdomen soft, non-tender and normal bowel sounds Musculoskeletal:no cyanosis of digits and no clubbing  PSYCH: alert & oriented x 3 with fluent speech NEURO: no focal motor/sensory deficits SKIN:  no rashes or significant lesions.   LABORATORY DATA:  I have reviewed the data as listed Lab Results  Component Value Date   WBC 3.5 (L) 07/19/2016   HGB 10.8 (L) 07/19/2016   HCT 31.0 (L) 07/19/2016   MCV 93.9 07/19/2016  PLT 63 (L) 07/19/2016    Recent Labs  06/19/16 0833 07/03/16 0850 07/10/16 1104 07/19/16 0845  NA 133* 135 135 135  K 4.1 3.6 3.5 3.3*  CL 101 103 105 104  CO2 _0 GLUCOSE 92 113* 124* 109*  BUN 22* 24* 25* 28*  CREATININE 1.28* 1.13 1.26* 1.22  CALCIUM 8.7* 8.8* 8.9 9.2  GFRNONAA 54* >60 55* 57*  GFRAA >60 >60 >60 >60  PROT 8.2* 7.9  --  7.8  ALBUMIN 3.4* 3.8  --  4.2  AST 20 21  --  20  ALT 22 16*  --  14*  ALKPHOS 57 57  --  58  BILITOT 0.6 0.7  --  0.9    RADIOGRAPHIC STUDIES: I have personally reviewed the radiological images as listed and agreed with the findings in the report. No results found.  ASSESSMENT & PLAN:   Primary cancer of left upper lobe of lung ([place]) # Adenocarcinoma LUL- T4N1 [however non-PET avid mediastinal LN; LLL infiltrative changes].  Currently on definitive concurrent chemo- with RT [last 4/27].   # No obvious clinical evidence of progression. However given the shortness of breath- check CT scan. Given his thrombocytopenia- suspect intolerance to further chemotherapy. Recommend immunotherapy- consolidation. The goal of treatment is cure. Discussed the mechanism of action; and the potential side effects in detail.   # Mild thrombocytopenia-  72/ sec to chemo  ITP vs others- monitor for now.  # Increasing SOB- recommend albuterol; Advair. Prescribed.  # Floaters/visual disturbance- recent MRI of the brain negative for any metastatic disease. Recommend ophthalmologic evaluation.   # Nose bleeds- secondary to low platelets/ dryness mucosa.  # Follow up in 2 week/labs/CT scan prior; Durvalumab.     Cammie Sickle, MD 07/23/2016 5:06 PM

## 2016-07-19 NOTE — Telephone Encounter (Signed)
Clarified prednisone order per Dr. Rogue Bussing.

## 2016-07-22 ENCOUNTER — Other Ambulatory Visit: Payer: Self-pay | Admitting: *Deleted

## 2016-07-22 MED ORDER — HYDROCOD POLST-CPM POLST ER 10-8 MG/5ML PO SUER: 5.0000 mL | Freq: Two times a day (BID) | ORAL | 0 refills | Status: DC | PRN

## 2016-07-23 ENCOUNTER — Telehealth: Payer: Self-pay | Admitting: Internal Medicine

## 2016-07-23 DIAGNOSIS — H43813 Vitreous degeneration, bilateral: Secondary | ICD-10-CM | POA: Diagnosis not present

## 2016-07-23 NOTE — Progress Notes (Signed)
DISCONTINUE ON PATHWAY REGIMEN - Non-Small Cell Lung     Administer weekly:     Paclitaxel        Dose Mod: None     Carboplatin        Dose Mod: None  **Always confirm dose/schedule in your pharmacy ordering system**    REASON: Toxicities / Adverse Event PRIOR TREATMENT: TVI712: Carboplatin AUC=2 + Paclitaxel 45 mg/m2 Weekly During Radiation TREATMENT RESPONSE: Stable Disease (SD)  START ON PATHWAY REGIMEN - Non-Small Cell Lung     A cycle is every 14 days:     Durvalumab   **Always confirm dose/schedule in your pharmacy ordering system**    Patient Characteristics: Stage III - Unresectable, PS = 0, 1 AJCC T Category: T4 Current Disease Status: No Distant Mets or Local Recurrence AJCC N Category: N1 AJCC M Category: M0 AJCC 8 Stage Grouping: IIIA Performance Status: PS = 0, 1  Intent of Therapy: Curative Intent, Discussed with Patient

## 2016-07-23 NOTE — Telephone Encounter (Signed)
Plan to do durvalumab; instead of opdivo as planned at next visit. Orders are in.

## 2016-07-24 NOTE — Telephone Encounter (Signed)
[name], Please change treatment on schedule to [facility]

## 2016-07-25 ENCOUNTER — Telehealth: Payer: Self-pay | Admitting: Unknown Physician Specialty

## 2016-07-25 ENCOUNTER — Telehealth: Payer: Self-pay | Admitting: *Deleted

## 2016-07-25 ENCOUNTER — Other Ambulatory Visit: Payer: Self-pay | Admitting: *Deleted

## 2016-07-25 ENCOUNTER — Inpatient Hospital Stay (HOSPITAL_BASED_OUTPATIENT_CLINIC_OR_DEPARTMENT_OTHER): Payer: Medicare HMO | Admitting: Internal Medicine

## 2016-07-25 ENCOUNTER — Inpatient Hospital Stay: Payer: Medicare HMO

## 2016-07-25 DIAGNOSIS — J439 Emphysema, unspecified: Secondary | ICD-10-CM | POA: Diagnosis not present

## 2016-07-25 DIAGNOSIS — Z923 Personal history of irradiation: Secondary | ICD-10-CM | POA: Diagnosis not present

## 2016-07-25 DIAGNOSIS — I6523 Occlusion and stenosis of bilateral carotid arteries: Secondary | ICD-10-CM | POA: Diagnosis not present

## 2016-07-25 DIAGNOSIS — Z809 Family history of malignant neoplasm, unspecified: Secondary | ICD-10-CM

## 2016-07-25 DIAGNOSIS — I129 Hypertensive chronic kidney disease with stage 1 through stage 4 chronic kidney disease, or unspecified chronic kidney disease: Secondary | ICD-10-CM

## 2016-07-25 DIAGNOSIS — C3412 Malignant neoplasm of upper lobe, left bronchus or lung: Secondary | ICD-10-CM | POA: Diagnosis not present

## 2016-07-25 DIAGNOSIS — N492 Inflammatory disorders of scrotum: Secondary | ICD-10-CM | POA: Diagnosis not present

## 2016-07-25 DIAGNOSIS — Z7952 Long term (current) use of systemic steroids: Secondary | ICD-10-CM

## 2016-07-25 DIAGNOSIS — E785 Hyperlipidemia, unspecified: Secondary | ICD-10-CM

## 2016-07-25 DIAGNOSIS — Z79899 Other long term (current) drug therapy: Secondary | ICD-10-CM

## 2016-07-25 DIAGNOSIS — R0602 Shortness of breath: Secondary | ICD-10-CM | POA: Diagnosis not present

## 2016-07-25 DIAGNOSIS — H538 Other visual disturbances: Secondary | ICD-10-CM

## 2016-07-25 DIAGNOSIS — Z9221 Personal history of antineoplastic chemotherapy: Secondary | ICD-10-CM

## 2016-07-25 DIAGNOSIS — Z7982 Long term (current) use of aspirin: Secondary | ICD-10-CM

## 2016-07-25 DIAGNOSIS — R040 Epistaxis: Secondary | ICD-10-CM | POA: Diagnosis not present

## 2016-07-25 DIAGNOSIS — Z87891 Personal history of nicotine dependence: Secondary | ICD-10-CM

## 2016-07-25 DIAGNOSIS — N183 Chronic kidney disease, stage 3 (moderate): Secondary | ICD-10-CM

## 2016-07-25 DIAGNOSIS — D696 Thrombocytopenia, unspecified: Secondary | ICD-10-CM

## 2016-07-25 DIAGNOSIS — R531 Weakness: Secondary | ICD-10-CM | POA: Diagnosis not present

## 2016-07-25 DIAGNOSIS — R509 Fever, unspecified: Secondary | ICD-10-CM | POA: Diagnosis not present

## 2016-07-25 DIAGNOSIS — M810 Age-related osteoporosis without current pathological fracture: Secondary | ICD-10-CM

## 2016-07-25 DIAGNOSIS — R110 Nausea: Secondary | ICD-10-CM | POA: Diagnosis not present

## 2016-07-25 DIAGNOSIS — Z7902 Long term (current) use of antithrombotics/antiplatelets: Secondary | ICD-10-CM

## 2016-07-25 LAB — CBC WITH DIFFERENTIAL/PLATELET
Basophils Absolute: 0 10*3/uL (ref 0–0.1)
Basophils Relative: 1 %
EOS ABS: 0.1 10*3/uL (ref 0–0.7)
EOS PCT: 1 %
HCT: 29.9 % — ABNORMAL LOW (ref 40.0–52.0)
Hemoglobin: 10.3 g/dL — ABNORMAL LOW (ref 13.0–18.0)
LYMPHS ABS: 0.2 10*3/uL — AB (ref 1.0–3.6)
LYMPHS PCT: 3 %
MCH: 32.8 pg (ref 26.0–34.0)
MCHC: 34.4 g/dL (ref 32.0–36.0)
MCV: 95.3 fL (ref 80.0–100.0)
MONO ABS: 0.2 10*3/uL (ref 0.2–1.0)
Monocytes Relative: 3 %
Neutro Abs: 6.8 10*3/uL — ABNORMAL HIGH (ref 1.4–6.5)
Neutrophils Relative %: 92 %
Platelets: 77 10*3/uL — ABNORMAL LOW (ref 150–440)
RBC: 3.14 MIL/uL — ABNORMAL LOW (ref 4.40–5.90)
RDW: 21.1 % — AB (ref 11.5–14.5)
WBC: 7.3 10*3/uL (ref 3.8–10.6)

## 2016-07-25 LAB — BASIC METABOLIC PANEL
Anion gap: 11 (ref 5–15)
BUN: 32 mg/dL — AB (ref 6–20)
CHLORIDE: 103 mmol/L (ref 101–111)
CO2: 21 mmol/L — ABNORMAL LOW (ref 22–32)
CREATININE: 1.49 mg/dL — AB (ref 0.61–1.24)
Calcium: 9 mg/dL (ref 8.9–10.3)
GFR calc Af Amer: 52 mL/min — ABNORMAL LOW (ref >60)
GFR calc non Af Amer: 45 mL/min — ABNORMAL LOW (ref >60)
Glucose, Bld: 207 mg/dL — ABNORMAL HIGH (ref 65–99)
Potassium: 3.6 mmol/L (ref 3.5–5.1)
SODIUM: 135 mmol/L (ref 135–145)

## 2016-07-25 MED ORDER — SULFAMETHOXAZOLE-TRIMETHOPRIM 800-160 MG PO TABS: 1.0000 | ORAL_TABLET | Freq: Two times a day (BID) | ORAL | 0 refills | Status: DC

## 2016-07-25 MED ORDER — CEPHALEXIN 500 MG PO CAPS: 500.0000 mg | ORAL_CAPSULE | Freq: Three times a day (TID) | ORAL | 0 refills | Status: DC

## 2016-07-25 NOTE — Telephone Encounter (Signed)
Patients daughter called back to see if we were going to call in the patients antibiotic for the boil in the private area.  I explained that normally they prefer to see the patient.  Claiborne Billings [phone]

## 2016-07-25 NOTE — Progress Notes (Signed)
Patient here as an acute add on to MD schedule to evaluate a "boil" in his groin. He states that he contacted his pcp office this morning but had not heard back from their office regarding plan of care. He asked Dr. Rogue Bussing to see him to further evaluate the boil on groin. He is currently on prednisone 20 mg three times a day.   RN reviewed chart for patient. Pt was provided an rx for Bactrim by PCP today. Msg in chart by pcp to have patient start this prescription and call back if no better at the beginning of next week.  Daughter states that pcp office had contacted her at 1340 regarding the bactrim prescription.   After r. Brahmanday evaluated patient-MD made the decision to cnl bactrim rx and prescribe Keflex.  [facility] pharmacy notified that pt will only pick up rx for Keflex.  Pt understand plan of care. He will keep his apt next week with Dr. Rogue Bussing as scheduled.

## 2016-07-25 NOTE — Telephone Encounter (Signed)
Patient called to see if Marcus Cortez would call him in an antibiotic for a boil.  He states he is unable to make it to the office to be seen.  I told him I would ask.  Please advise.   Thanks  [phone]

## 2016-07-25 NOTE — Telephone Encounter (Signed)
I will call in an antibiotic- often they don't take care of boils. If he's not 100% better by the beginning of next week, he will need to be seen here or at an urgent care.

## 2016-07-25 NOTE — Assessment & Plan Note (Deleted)
#   Adenocarcinoma LUL- T4N1 [however non-PET avid mediastinal LN; LLL infiltrative changes].  Currently on definitive concurrent chemo- with RT [last 4/27].   # No obvious clinical evidence of progression. However given the shortness of breath- check CT scan. Given his thrombocytopenia- suspect intolerance to further chemotherapy. Recommend immunotherapy- consolidation. The goal of treatment is cure. Discussed the mechanism of action; and the potential side effects in detail.   # Mild thrombocytopenia- 72/ sec to chemo  ITP vs others- monitor for now.  # Increasing SOB- recommend albuterol; Advair. Prescribed.  # Floaters/visual disturbance- recent MRI of the brain negative for any metastatic disease. Recommend ophthalmologic evaluation.   # Nose bleeds- secondary to low platelets/ dryness mucosa.  # Follow up in 2 week/labs/CT scan prior; Durvalumab.

## 2016-07-25 NOTE — Telephone Encounter (Signed)
Will route to Dr. Wynetta Emery

## 2016-07-25 NOTE — Telephone Encounter (Signed)
Marcus Cortez aware. Pt has upcoming appt with his oncologist in case antibiotic does not help.

## 2016-07-25 NOTE — Assessment & Plan Note (Addendum)
#   Carbuncle of the scrotum- no evidence of any abscess formation. Recommend Keflex 500 mg 3 times a day  # Adenocarcinoma LUL- T4N1 [however non-PET avid mediastinal LN; LLL infiltrative changes].  Currently on definitive concurrent chemo- with RT [last 4/27]. No clinical progression shortness of breath improved on steroids. Plan to start consolidation/Durvalumab next week.  # Mild thrombocytopenia- 72/ sec to chemo  ITP vs others- improving platelets 77 today. Question improvement secondary to steroids.  # CKD- stage III head and 1.49 slightly above baseline recommend increased fluid intake.  # Floaters/visual disturbance-status post ophthalmology evaluation awaiting retinal specialist evaluation.  # Follow up as planned next week for treatment/labs.

## 2016-07-25 NOTE — Progress Notes (Signed)
[facility] NOTE  Patient Care Team: Kathrine Haddock, NP as PCP - General (Nurse Practitioner) Minna Merritts, MD as Consulting Physician (Cardiology)  CHIEF COMPLAINTS/PURPOSE OF CONSULTATION:  Oncology History   # FEB 2018- [place] CA LUL [CT Bx] T4N1 [abutting mediastinum; Mediastinal LN on CT; Neg on PET]; NOT surgical candidate [Dr.Oaks/ Dr.Henderickson; [place]];   # Carbo-Taxol RT [[facility]]  # Mild Thrombocytopenia [130s?]  # MILD THROMBOCYTOPENIA [130-140s]; Feb 2018- MR brain-NEG  # Bil Carotid stenosis [R>90% s/p CEA on 05/22/16; L~50%; Dr.Schneir].   # MOL Testing- ROS-1;ALK/EGFR/B-RAF; PDL-1- 1%.      Primary cancer of left upper lobe of lung (HCC)    HISTORY OF PRESENTING ILLNESS:  Marcus Cortez 71 y.o.  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 Currently status post chemotherapy- radiation approximately 4 weeks ago. He is currently awaiting to start consolidation immunotherapy next week.  Patient noted to have erythema/knot left-sided the scrotum. He noted that approximately 4 days ago. He had previous history of similar lesions treated with antibiotics. He did not have her have to have incision and drainage. He denies any fevers or chills.In the interim he has been evaluated by ophthalmology regarding the floaters. He has been referred to a retina specialist awaiting appointment next week.  His breathing is improved on the prednisone that was started last visit. Denies having any increasing nosebleeds. No hemoptysis. No weight loss.  Denies any nausea vomiting. Denies any pain.No tingling or numbness. He states he been drinking enough fluids.   ROS: A complete 10 point review of system is done which is negative except mentioned above in history of present illness  MEDICAL HISTORY:  Past Medical History:  Diagnosis Date  . Allergy   . Cancer ([place])  03/2016   lung  . Carotid artery bruit   . CINV (chemotherapy-induced nausea and vomiting)   . Emphysema lung ([place])   . Emphysema of lung ([place])   . Former smoker   . Heart murmur   . Hyperlipidemia   . Hypertension   . IFG (impaired fasting glucose)   . Osteoporosis   . Pulmonary stenosis     SURGICAL HISTORY: Past Surgical History:  Procedure Laterality Date  . CAROTID PTA/STENT INTERVENTION Right 05/22/2016   Procedure: Carotid PTA/Stent Intervention;  Surgeon: Katha Cabal, MD;  Location: [facility];  Service: Cardiovascular;  Laterality: Right;  . EYE SURGERY  2010   cataract  . 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 Previous history of smoking half a pack a day for 25-30 years. Quit approximately 2 years ago. Social History   Social History  . Marital status: Widowed    Spouse name: N/A  . Number of children: N/A  . Years of education: N/A   Occupational History  . Not on file.   Social History Main Topics  . Smoking status: Former Smoker    Packs/day: 0.50    Years: 35.00    Types: Cigarettes    Quit date: 01/10/2015  . Smokeless tobacco: Former Systems developer    Types: Chew    Quit date: 04/10/1978  . Alcohol use No  . Drug use: No  . Sexual activity: Yes   Other Topics Concern  .  Not on file   Social History Narrative  . No narrative on file    FAMILY HISTORY: Family History  Problem Relation Age of Onset  . Hypertension Mother   . Thyroid disease Mother   . Glaucoma Mother   . Alzheimer's disease Father   . Stroke Maternal Grandmother   . Cancer Maternal Grandfather   . Alzheimer's disease Paternal Grandmother   . Stroke Paternal Grandfather     ALLERGIES:  has No Known Allergies.  MEDICATIONS:  Current Outpatient Prescriptions   Medication Sig Dispense Refill  . acetaminophen (TYLENOL) 325 MG tablet Take 325-650 mg by mouth every 6 (six) hours as needed (for pain/headache.).     Marland Kitchen albuterol (PROVENTIL HFA;VENTOLIN HFA) 108 (90 Base) MCG/ACT inhaler Inhale 2 puffs into the lungs every 6 (six) hours as needed for wheezing or shortness of breath. 1 Inhaler 2  . amLODipine (NORVASC) 10 MG tablet Take 1 tablet (10 mg total) by mouth daily. 90 tablet 3  . aspirin 81 MG tablet Take 81 mg by mouth daily.     . chlorpheniramine-HYDROcodone (TUSSIONEX) 10-8 MG/5ML SUER Take 5 mLs by mouth every 12 (twelve) hours as needed for cough. 240 mL 0  . clopidogrel (PLAVIX) 75 MG tablet Take 1 tablet (75 mg total) by mouth daily with breakfast. 30 tablet 6  . ezetimibe (ZETIA) 10 MG tablet Take 1 tablet (10 mg total) by mouth daily. 30 tablet 11  . Fluticasone-Salmeterol (ADVAIR DISKUS) 500-50 MCG/DOSE AEPB Inhale 1 puff into the lungs 2 (two) times daily. 1 each 3  . gemfibrozil (LOPID) 600 MG tablet Take 600 mg by mouth 2 (two) times daily.     Marland Kitchen losartan (COZAAR) 100 MG tablet Take 1 tablet (100 mg total) by mouth daily. (Patient taking differently: Take 50 mg by mouth daily. BASED ON BLOOD PRESSURE READINGS) 30 tablet 11  . predniSONE (DELTASONE) 20 MG tablet Take 3 pills a day in AM x 1 week; then once a day x 1 week; and the STOP. 60 tablet 0  . cephALEXin (KEFLEX) 500 MG capsule Take 1 capsule (500 mg total) by mouth 3 (three) times daily. 21 capsule 0  . ondansetron (ZOFRAN) 8 MG tablet Take 1 tablet (8 mg total) by mouth every 8 (eight) hours as needed for nausea or vomiting (start 3 days; after chemo). (Patient not taking: Reported on 07/25/2016) 40 tablet 1  . prochlorperazine (COMPAZINE) 10 MG tablet Take 1 tablet (10 mg total) by mouth every 6 (six) hours as needed for nausea or vomiting. (Patient not taking: Reported on 07/25/2016) 40 tablet 1  . sucralfate (CARAFATE) 1 g tablet Take 1 tablet (1 g total) by mouth 3 (three) times  daily. Dissolve in 2-3 tbsp warm water, swish and swallow (Patient not taking: Reported on 07/25/2016) 90 tablet 3  . sulfamethoxazole-trimethoprim (BACTRIM DS,SEPTRA DS) 800-160 MG tablet Take 1 tablet by mouth 2 (two) times daily. (Patient not taking: Reported on 07/25/2016) 14 tablet 0   No current facility-administered medications for this visit.       Marland Kitchen  PHYSICAL EXAMINATION: ECOG PERFORMANCE STATUS: 0 - Asymptomatic  Vitals:   07/25/16 1345  BP: 117/73  Pulse: 87  Resp: 20  Temp: 97.8 F (36.6 C)   There were no vitals filed for this visit.  GENERAL: Well-nourished well-developed; Alert, no distress and comfortable.   With His daughter.  EYES: no pallor or icterus OROPHARYNX: no thrush or ulceration; good dentition  NECK: supple, no masses felt LYMPH:  no palpable lymphadenopathy in the cervical, axillary or inguinal regions LUNGS: Decreased breath sounds to auscultation on left side and  No wheeze or crackles HEART/CVS: regular rate & rhythm and no murmurs; No lower extremity edema ABDOMEN: abdomen soft, non-tender and normal bowel sounds Musculoskeletal:no cyanosis of digits and no clubbing  PSYCH: alert & oriented x 3 with fluent speech NEURO: no focal motor/sensory deficits SKIN:  no rashes; left side of the scrotum- "boil " noted. Erythema of the skin no drainage no significant fluctuation.  LABORATORY DATA:  I have reviewed the data as listed Lab Results  Component Value Date   WBC 7.3 07/25/2016   HGB 10.3 (L) 07/25/2016   HCT 29.9 (L) 07/25/2016   MCV 95.3 07/25/2016   PLT 77 (L) 07/25/2016    Recent Labs  06/19/16 0833 07/03/16 0850 07/10/16 1104 07/19/16 0845 07/25/16 1300  NA 133* 135 135 135 135  K 4.1 3.6 3.5 3.3* 3.6  CL 101 103 105 104 103  CO2 _0 21*  GLUCOSE 92 113* 124* 109* 207*  BUN 22* 24* 25* 28* 32*  CREATININE 1.28* 1.13 1.26* 1.22 1.49*  CALCIUM 8.7* 8.8* 8.9 9.2 9.0  GFRNONAA 54* >60 55* 57* 45*  GFRAA >60 >60 >60  >60 52*  PROT 8.2* 7.9  --  7.8  --   ALBUMIN 3.4* 3.8  --  4.2  --   AST 20 21  --  20  --   ALT 22 16*  --  14*  --   ALKPHOS 57 57  --  58  --   BILITOT 0.6 0.7  --  0.9  --     RADIOGRAPHIC STUDIES: I have personally reviewed the radiological images as listed and agreed with the findings in the report. No results found.  ASSESSMENT & PLAN:   Carbuncle of scrotum # Carbuncle of the scrotum- no evidence of any abscess formation. Recommend Keflex 500 mg 3 times a day  # Adenocarcinoma LUL- T4N1 [however non-PET avid mediastinal LN; LLL infiltrative changes].  Currently on definitive concurrent chemo- with RT [last 4/27]. No clinical progression shortness of breath improved on steroids. Plan to start consolidation/Durvalumab next week.  # Mild thrombocytopenia- 72/ sec to chemo  ITP vs others- improving platelets 77 today. Question improvement secondary to steroids.  # CKD- stage III head and 1.49 slightly above baseline recommend increased fluid intake.  # Floaters/visual disturbance-status post ophthalmology evaluation awaiting retinal specialist evaluation.  # Follow up as planned next week for treatment/labs.      Cammie Sickle, MD 07/28/2016 6:39 PM

## 2016-07-25 NOTE — Patient Instructions (Signed)
Durvalumab injection  What is this medicine?  DURVALUMAB (dur VAL ue mab) is a monoclonal antibody. It is used to treat urothelial cancer.  This medicine may be used for other purposes; ask your health care provider or pharmacist if you have questions.  COMMON BRAND NAME(S): IMFINZI  What should I tell my health care provider before I take this medicine?  They need to know if you have any of these conditions:  -diabetes  -immune system problems  -infection  -inflammatory bowel disease  -kidney disease  -liver disease  -lung or breathing disease  -lupus  -organ transplant  -stomach or intestine problems  -thyroid disease  -an unusual or allergic reaction to durvalumab, other medicines, foods, dyes, or preservatives  -pregnant or trying to get pregnant  -breast-feeding  How should I use this medicine?  This medicine is for infusion into a vein. It is given by a health care professional in a hospital or clinic setting.  A special MedGuide will be given to you before each treatment. Be sure to read this information carefully each time.  Talk to your pediatrician regarding the use of this medicine in children. Special care may be needed.  Overdosage: If you think you have taken too much of this medicine contact a poison control center or emergency room at once.  NOTE: This medicine is only for you. Do not share this medicine with others.  What if I miss a dose?  It is important not to miss your dose. Call your doctor or health care professional if you are unable to keep an appointment.  What may interact with this medicine?  Interactions have not been studied.  This list may not describe all possible interactions. Give your health care provider a list of all the medicines, herbs, non-prescription drugs, or dietary supplements you use. Also tell them if you smoke, drink alcohol, or use illegal drugs. Some items may interact with your medicine.  What should I watch for while using this medicine?  This drug may make you  feel generally unwell. Continue your course of treatment even though you feel ill unless your doctor tells you to stop.  You may need blood work done while you are taking this medicine.  Do not become pregnant while taking this medicine or for 3 months after stopping it. Women should inform their doctor if they wish to become pregnant or think they might be pregnant. There is a potential for serious side effects to an unborn child. Talk to your health care professional or pharmacist for more information. Do not breast-feed an infant while taking this medicine or for 3 months after stopping it.  What side effects may I notice from receiving this medicine?  Side effects that you should report to your doctor or health care professional as soon as possible:  -allergic reactions like skin rash, itching or hives, swelling of the face, lips, or tongue  -black, tarry stools  -bloody or watery diarrhea  -breathing problems  -change in emotions or moods  -change in sex drive  -changes in vision  -chest pain or chest tightness  -chills  -confusion  -cough  -facial flushing  -fever  -headache  -signs and symptoms of high blood sugar such as dizziness; dry mouth; dry skin; fruity breath; nausea; stomach pain; increased hunger or thirst; increased urination  -signs and symptoms of liver injury like dark yellow or brown urine; general ill feeling or flu-like symptoms; light-colored stools; loss of appetite; nausea; right upper belly pain;   unusually weak or tired; yellowing of the eyes or skin  -stomach pain  -trouble passing urine or change in the amount of urine  -weight gain or weight loss  Side effects that usually do not require medical attention (report these to your doctor or health care professional if they continue or are bothersome):  -bone pain  -constipation  -loss of appetite  -muscle pain  -nausea  -swelling of the ankles, feet, hands  -tiredness  This list may not describe all possible side effects. Call your doctor  for medical advice about side effects. You may report side effects to FDA at 1-800-FDA-1088.  Where should I keep my medicine?  This drug is given in a hospital or clinic and will not be stored at home.  NOTE: This sheet is a summary. It may not cover all possible information. If you have questions about this medicine, talk to your doctor, pharmacist, or health care provider.  © 2018 Elsevier/Gold Standard (2015-09-29 15:50:36)

## 2016-07-25 NOTE — Telephone Encounter (Signed)
Daughter called to report that the patient had an abscess in his groin. Patient added to schedule this after noon to evaluate abscess.

## 2016-07-25 NOTE — Telephone Encounter (Signed)
Routing to provider  

## 2016-07-29 ENCOUNTER — Encounter (INDEPENDENT_AMBULATORY_CARE_PROVIDER_SITE_OTHER): Payer: Self-pay | Admitting: Vascular Surgery

## 2016-07-29 ENCOUNTER — Ambulatory Visit (INDEPENDENT_AMBULATORY_CARE_PROVIDER_SITE_OTHER): Payer: Medicare HMO

## 2016-07-29 ENCOUNTER — Ambulatory Visit (INDEPENDENT_AMBULATORY_CARE_PROVIDER_SITE_OTHER): Payer: Medicare HMO | Admitting: Vascular Surgery

## 2016-07-29 VITALS — BP 111/70 | HR 94 | Resp 16 | Wt 200.0 lb

## 2016-07-29 DIAGNOSIS — E781 Pure hyperglyceridemia: Secondary | ICD-10-CM | POA: Diagnosis not present

## 2016-07-29 DIAGNOSIS — I10 Essential (primary) hypertension: Secondary | ICD-10-CM

## 2016-07-29 DIAGNOSIS — I6521 Occlusion and stenosis of right carotid artery: Secondary | ICD-10-CM

## 2016-07-29 DIAGNOSIS — H4311 Vitreous hemorrhage, right eye: Secondary | ICD-10-CM | POA: Diagnosis not present

## 2016-07-29 LAB — VAS US CAROTID
LCCADDIAS: 27 cm/s
LCCADSYS: 89 cm/s
LCCAPDIAS: 16 cm/s
LCCAPSYS: 97 cm/s
LEFT ECA DIAS: -38 cm/s
LEFT VERTEBRAL DIAS: 28 cm/s
LICADDIAS: -31 cm/s
LICADSYS: -88 cm/s
LICAPSYS: -122 cm/s
Left ICA prox dias: -33 cm/s
RCCADSYS: -88 cm/s
RCCAPSYS: 100 cm/s
RIGHT CCA MID DIAS: 28 cm/s
RIGHT ECA DIAS: -42 cm/s
RIGHT VERTEBRAL DIAS: 13 cm/s
Right CCA prox dias: 22 cm/s

## 2016-07-29 NOTE — Progress Notes (Signed)
Subjective:    Patient ID: Marcus Cortez, male    DOB: Apr 06, 1945, 71 y.o.   MRN: [id number] Chief Complaint  Patient presents with  . Follow-up   Patient presents for three month follow up. He is s/p right carotid stent placement on 05/22/16. His post-operative course remains uneventful with the exception of retina bleeding in the right eye. He is being followed by MD. Duplex with patent right ICA stent and left ICA stenosis (low end 40-59%). The patient denies experiencing Amaurosis Fugax, TIA like symptoms or focal motor deficits. The patient continues a regimen of ASA and dyslipidemia medication.    Review of Systems  Constitutional: Negative.   HENT:       Right Retinal Bleeding  Eyes: Negative.   Respiratory: Negative.   Cardiovascular: Negative.   Gastrointestinal: Negative.   Endocrine: Negative.   Genitourinary: Negative.   Musculoskeletal: Negative.   Skin: Negative.   Allergic/Immunologic: Negative.   Neurological: Negative.   Hematological: Negative.   Psychiatric/Behavioral: Negative.       Objective:   Physical Exam  Constitutional: He is oriented to person, place, and time. He appears well-developed and well-nourished. No distress.  HENT:  Head: Normocephalic and atraumatic.  Eyes: Conjunctivae are normal. Pupils are equal, round, and reactive to light.  Neck: Normal range of motion.  No carotid bruits auscultated  Pulmonary/Chest: Effort normal.  Musculoskeletal: Normal range of motion. He exhibits no edema.  Neurological: He is alert and oriented to person, place, and time.  Skin: Skin is warm and dry. He is not diaphoretic.  Psychiatric: He has a normal mood and affect. His behavior is normal. Judgment and thought content normal.  Vitals reviewed.  BP 111/70   Pulse 94   Resp 16   Wt 200 lb (90.7 kg)   BMI 26.39 kg/m   Past Medical History:  Diagnosis Date  . Allergy   . Cancer ([place]) 03/2016   lung  . Carotid artery bruit   . CINV  (chemotherapy-induced nausea and vomiting)   . Emphysema lung ([place])   . Emphysema of lung ([place])   . Former smoker   . Heart murmur   . Hyperlipidemia   . Hypertension   . IFG (impaired fasting glucose)   . Osteoporosis   . Pulmonary stenosis    Social History   Social History  . Marital status: Widowed    Spouse name: N/A  . Number of children: N/A  . Years of education: N/A   Occupational History  . Not on file.   Social History Main Topics  . Smoking status: Former Smoker    Packs/day: 0.50    Years: 35.00    Types: Cigarettes    Quit date: 01/10/2015  . Smokeless tobacco: Former Systems developer    Types: Chew    Quit date: 04/10/1978  . Alcohol use No  . Drug use: No  . Sexual activity: Yes   Other Topics Concern  . Not on file   Social History Narrative  . No narrative on file   Past Surgical History:  Procedure Laterality Date  . CAROTID PTA/STENT INTERVENTION Right 05/22/2016   Procedure: Carotid PTA/Stent Intervention;  Surgeon: Katha Cabal, MD;  Location: [facility];  Service: Cardiovascular;  Laterality: Right;  . EYE SURGERY  2010   cataract  . FLEXIBLE BRONCHOSCOPY N/A 04/10/2016   Procedure: FLEXIBLE BRONCHOSCOPY;  Surgeon: Wilhelmina Mcardle, MD;  Location: [facility];  Service: Pulmonary;  Laterality: N/A;  Family History  Problem Relation Age of Onset  . Hypertension Mother   . Thyroid disease Mother   . Glaucoma Mother   . Alzheimer's disease Father   . Stroke Maternal Grandmother   . Cancer Maternal Grandfather   . Alzheimer's disease Paternal Grandmother   . Stroke Paternal Grandfather    No Known Allergies     Assessment & Plan:  Patient presents for three month follow up. He is s/p right carotid stent placement on 05/22/16. His post-operative course remains uneventful with the exception of retina bleeding in the right eye. He is being followed by MD. Duplex with patent right ICA stent and left ICA stenosis (low end 40-59%). The patient  denies experiencing Amaurosis Fugax, TIA like symptoms or focal motor deficits. The patient continues a regimen of ASA and dyslipidemia medication.   1. Stenosis of right carotid artery - Stable Studies reviewed with patient. Patient asymptomatic with stable duplex.   No intervention at this time.  Will stop Plavix in setting of retinal bleeding. Patient to continues ASA 32m Patient to return in six months for surveillance carotid duplex. Patient to remain abstinent of tobacco use. I have discussed with the patient at length the risk factors for and pathogenesis of atherosclerotic disease and encouraged a healthy diet, regular exercise regimen and blood pressure / glucose control.  Patient was instructed to contact our office in the interim with problems such as arm / leg weakness or numbness, speech / swallowing difficulty or temporary monocular blindness. The patient expresses their understanding.   2. Hypertension, unspecified type - Stable Encouraged good control as its slows the progression of atherosclerotic disease  3. Pure hyperglyceridemia - Stable Encouraged good control as its slows the progression of atherosclerotic disease  Current Outpatient Prescriptions on File Prior to Visit  Medication Sig Dispense Refill  . acetaminophen (TYLENOL) 325 MG tablet Take 325-650 mg by mouth every 6 (six) hours as needed (for pain/headache.).     .Marland Kitchenalbuterol (PROVENTIL HFA;VENTOLIN HFA) 108 (90 Base) MCG/ACT inhaler Inhale 2 puffs into the lungs every 6 (six) hours as needed for wheezing or shortness of breath. 1 Inhaler 2  . amLODipine (NORVASC) 10 MG tablet Take 1 tablet (10 mg total) by mouth daily. 90 tablet 3  . aspirin 81 MG tablet Take 81 mg by mouth daily.     . chlorpheniramine-HYDROcodone (TUSSIONEX) 10-8 MG/5ML SUER Take 5 mLs by mouth every 12 (twelve) hours as needed for cough. 240 mL 0  . clopidogrel (PLAVIX) 75 MG tablet Take 1 tablet (75 mg total) by mouth daily with  breakfast. 30 tablet 6  . ezetimibe (ZETIA) 10 MG tablet Take 1 tablet (10 mg total) by mouth daily. 30 tablet 11  . Fluticasone-Salmeterol (ADVAIR DISKUS) 500-50 MCG/DOSE AEPB Inhale 1 puff into the lungs 2 (two) times daily. 1 each 3  . gemfibrozil (LOPID) 600 MG tablet Take 600 mg by mouth 2 (two) times daily.     .Marland Kitchenlosartan (COZAAR) 100 MG tablet Take 1 tablet (100 mg total) by mouth daily. (Patient taking differently: Take 50 mg by mouth daily. BASED ON BLOOD PRESSURE READINGS) 30 tablet 11  . ondansetron (ZOFRAN) 8 MG tablet Take 1 tablet (8 mg total) by mouth every 8 (eight) hours as needed for nausea or vomiting (start 3 days; after chemo). 40 tablet 1  . predniSONE (DELTASONE) 20 MG tablet Take 3 pills a day in AM x 1 week; then once a day x 1 week; and the STOP.  60 tablet 0  . prochlorperazine (COMPAZINE) 10 MG tablet Take 1 tablet (10 mg total) by mouth every 6 (six) hours as needed for nausea or vomiting. 40 tablet 1  . cephALEXin (KEFLEX) 500 MG capsule Take 1 capsule (500 mg total) by mouth 3 (three) times daily. (Patient not taking: Reported on 07/29/2016) 21 capsule 0  . sucralfate (CARAFATE) 1 g tablet Take 1 tablet (1 g total) by mouth 3 (three) times daily. Dissolve in 2-3 tbsp warm water, swish and swallow (Patient not taking: Reported on 07/25/2016) 90 tablet 3  . sulfamethoxazole-trimethoprim (BACTRIM DS,SEPTRA DS) 800-160 MG tablet Take 1 tablet by mouth 2 (two) times daily. (Patient not taking: Reported on 07/25/2016) 14 tablet 0   No current facility-administered medications on file prior to visit.     There are no Patient Instructions on file for this visit. No Follow-up on file.   Nickolus Wadding A Charrise Lardner, PA-C

## 2016-07-31 ENCOUNTER — Ambulatory Visit
Admission: RE | Admit: 2016-07-31 | Discharge: 2016-07-31 | Disposition: A | Discharge status: Home / Self Care | Payer: MEDICARE | Source: Ambulatory Visit | Attending: Internal Medicine | Admitting: Internal Medicine

## 2016-07-31 ENCOUNTER — Telehealth: Payer: Self-pay | Admitting: Internal Medicine

## 2016-07-31 DIAGNOSIS — J181 Lobar pneumonia, unspecified organism: Secondary | ICD-10-CM | POA: Diagnosis not present

## 2016-07-31 DIAGNOSIS — R918 Other nonspecific abnormal finding of lung field: Secondary | ICD-10-CM

## 2016-07-31 DIAGNOSIS — D649 Anemia, unspecified: Secondary | ICD-10-CM | POA: Diagnosis present

## 2016-07-31 DIAGNOSIS — Z7982 Long term (current) use of aspirin: Secondary | ICD-10-CM | POA: Diagnosis not present

## 2016-07-31 DIAGNOSIS — Z9221 Personal history of antineoplastic chemotherapy: Secondary | ICD-10-CM | POA: Diagnosis not present

## 2016-07-31 DIAGNOSIS — Z923 Personal history of irradiation: Secondary | ICD-10-CM

## 2016-07-31 DIAGNOSIS — J189 Pneumonia, unspecified organism: Secondary | ICD-10-CM | POA: Diagnosis present

## 2016-07-31 DIAGNOSIS — J439 Emphysema, unspecified: Secondary | ICD-10-CM | POA: Insufficient documentation

## 2016-07-31 DIAGNOSIS — F17220 Nicotine dependence, chewing tobacco, uncomplicated: Secondary | ICD-10-CM | POA: Diagnosis present

## 2016-07-31 DIAGNOSIS — M810 Age-related osteoporosis without current pathological fracture: Secondary | ICD-10-CM | POA: Diagnosis not present

## 2016-07-31 DIAGNOSIS — C3412 Malignant neoplasm of upper lobe, left bronchus or lung: Secondary | ICD-10-CM | POA: Insufficient documentation

## 2016-07-31 DIAGNOSIS — J849 Interstitial pulmonary disease, unspecified: Secondary | ICD-10-CM

## 2016-07-31 DIAGNOSIS — I10 Essential (primary) hypertension: Secondary | ICD-10-CM | POA: Diagnosis present

## 2016-07-31 DIAGNOSIS — E871 Hypo-osmolality and hyponatremia: Secondary | ICD-10-CM | POA: Diagnosis present

## 2016-07-31 DIAGNOSIS — E785 Hyperlipidemia, unspecified: Secondary | ICD-10-CM | POA: Diagnosis present

## 2016-07-31 DIAGNOSIS — N289 Disorder of kidney and ureter, unspecified: Secondary | ICD-10-CM | POA: Diagnosis present

## 2016-07-31 DIAGNOSIS — R69 Illness, unspecified: Secondary | ICD-10-CM | POA: Diagnosis not present

## 2016-07-31 DIAGNOSIS — J432 Centrilobular emphysema: Secondary | ICD-10-CM | POA: Diagnosis present

## 2016-07-31 DIAGNOSIS — Z79899 Other long term (current) drug therapy: Secondary | ICD-10-CM | POA: Diagnosis not present

## 2016-07-31 MED ORDER — LEVOFLOXACIN 500 MG PO TABS: 500.0000 mg | ORAL_TABLET | Freq: Every day | ORAL | 0 refills | Status: DC

## 2016-07-31 NOTE — Telephone Encounter (Signed)
Spoke to pt'w daughter re: CT scan results. recommend Levaquin; pt non-compliant with steroids. Discussed with Dr.Kasa- on vacation. Will check with Dr.Ram in morning re: next plan of care.

## 2016-08-01 ENCOUNTER — Telehealth: Payer: Self-pay | Admitting: Internal Medicine

## 2016-08-01 MED ORDER — PREDNISONE 20 MG PO TABS: ORAL_TABLET | ORAL | 0 refills | Status: DC

## 2016-08-01 NOTE — Telephone Encounter (Signed)
Discussed with Dr.Ram- reviewed the CT scans- agree with levaquin; add prednsione - again prescribed this AM [discussed my concerns of non-compliance]. If worse recommend admission.  Left VM for pt's daughter- re: discussion and reocmmendations.

## 2016-08-02 ENCOUNTER — Inpatient Hospital Stay (HOSPITAL_BASED_OUTPATIENT_CLINIC_OR_DEPARTMENT_OTHER): Payer: Medicare HMO | Admitting: Internal Medicine

## 2016-08-02 ENCOUNTER — Inpatient Hospital Stay: Payer: Medicare HMO

## 2016-08-02 ENCOUNTER — Inpatient Hospital Stay
Admission: AD | Admit: 2016-08-02 | Discharge: 2016-08-03 | DRG: 194 | Disposition: A | Discharge status: Home / Self Care | Payer: MEDICARE | Source: Ambulatory Visit | Attending: Internal Medicine | Admitting: Internal Medicine

## 2016-08-02 VITALS — BP 91/58 | HR 109 | Temp 99.7°F | Resp 30 | Wt 196.4 lb

## 2016-08-02 DIAGNOSIS — Z792 Long term (current) use of antibiotics: Secondary | ICD-10-CM | POA: Diagnosis not present

## 2016-08-02 DIAGNOSIS — J432 Centrilobular emphysema: Secondary | ICD-10-CM | POA: Diagnosis not present

## 2016-08-02 DIAGNOSIS — R69 Illness, unspecified: Secondary | ICD-10-CM | POA: Diagnosis not present

## 2016-08-02 DIAGNOSIS — R509 Fever, unspecified: Secondary | ICD-10-CM

## 2016-08-02 DIAGNOSIS — E785 Hyperlipidemia, unspecified: Secondary | ICD-10-CM

## 2016-08-02 DIAGNOSIS — R110 Nausea: Secondary | ICD-10-CM | POA: Diagnosis not present

## 2016-08-02 DIAGNOSIS — C3412 Malignant neoplasm of upper lobe, left bronchus or lung: Secondary | ICD-10-CM

## 2016-08-02 DIAGNOSIS — I6523 Occlusion and stenosis of bilateral carotid arteries: Secondary | ICD-10-CM | POA: Diagnosis not present

## 2016-08-02 DIAGNOSIS — N183 Chronic kidney disease, stage 3 (moderate): Secondary | ICD-10-CM

## 2016-08-02 DIAGNOSIS — Z9119 Patient's noncompliance with other medical treatment and regimen: Secondary | ICD-10-CM

## 2016-08-02 DIAGNOSIS — R634 Abnormal weight loss: Secondary | ICD-10-CM

## 2016-08-02 DIAGNOSIS — R5383 Other fatigue: Secondary | ICD-10-CM

## 2016-08-02 DIAGNOSIS — Z809 Family history of malignant neoplasm, unspecified: Secondary | ICD-10-CM

## 2016-08-02 DIAGNOSIS — R630 Anorexia: Secondary | ICD-10-CM

## 2016-08-02 DIAGNOSIS — R531 Weakness: Secondary | ICD-10-CM | POA: Diagnosis not present

## 2016-08-02 DIAGNOSIS — M810 Age-related osteoporosis without current pathological fracture: Secondary | ICD-10-CM

## 2016-08-02 DIAGNOSIS — J441 Chronic obstructive pulmonary disease with (acute) exacerbation: Secondary | ICD-10-CM | POA: Diagnosis not present

## 2016-08-02 DIAGNOSIS — Z87891 Personal history of nicotine dependence: Secondary | ICD-10-CM

## 2016-08-02 DIAGNOSIS — J439 Emphysema, unspecified: Secondary | ICD-10-CM

## 2016-08-02 DIAGNOSIS — N289 Disorder of kidney and ureter, unspecified: Secondary | ICD-10-CM | POA: Diagnosis not present

## 2016-08-02 DIAGNOSIS — H538 Other visual disturbances: Secondary | ICD-10-CM

## 2016-08-02 DIAGNOSIS — J189 Pneumonia, unspecified organism: Secondary | ICD-10-CM | POA: Diagnosis present

## 2016-08-02 DIAGNOSIS — Z7902 Long term (current) use of antithrombotics/antiplatelets: Secondary | ICD-10-CM | POA: Diagnosis not present

## 2016-08-02 DIAGNOSIS — I10 Essential (primary) hypertension: Secondary | ICD-10-CM | POA: Diagnosis not present

## 2016-08-02 DIAGNOSIS — Z9221 Personal history of antineoplastic chemotherapy: Secondary | ICD-10-CM | POA: Diagnosis not present

## 2016-08-02 DIAGNOSIS — D509 Iron deficiency anemia, unspecified: Secondary | ICD-10-CM | POA: Diagnosis not present

## 2016-08-02 DIAGNOSIS — R040 Epistaxis: Secondary | ICD-10-CM | POA: Diagnosis not present

## 2016-08-02 DIAGNOSIS — N492 Inflammatory disorders of scrotum: Secondary | ICD-10-CM | POA: Diagnosis not present

## 2016-08-02 DIAGNOSIS — Z923 Personal history of irradiation: Secondary | ICD-10-CM

## 2016-08-02 DIAGNOSIS — Z7982 Long term (current) use of aspirin: Secondary | ICD-10-CM | POA: Diagnosis not present

## 2016-08-02 DIAGNOSIS — D696 Thrombocytopenia, unspecified: Secondary | ICD-10-CM | POA: Diagnosis not present

## 2016-08-02 DIAGNOSIS — Z7952 Long term (current) use of systemic steroids: Secondary | ICD-10-CM

## 2016-08-02 DIAGNOSIS — R0602 Shortness of breath: Secondary | ICD-10-CM

## 2016-08-02 DIAGNOSIS — J181 Lobar pneumonia, unspecified organism: Secondary | ICD-10-CM | POA: Diagnosis not present

## 2016-08-02 DIAGNOSIS — E871 Hypo-osmolality and hyponatremia: Secondary | ICD-10-CM | POA: Diagnosis not present

## 2016-08-02 DIAGNOSIS — Z79899 Other long term (current) drug therapy: Secondary | ICD-10-CM

## 2016-08-02 DIAGNOSIS — I129 Hypertensive chronic kidney disease with stage 1 through stage 4 chronic kidney disease, or unspecified chronic kidney disease: Secondary | ICD-10-CM

## 2016-08-02 DIAGNOSIS — C3490 Malignant neoplasm of unspecified part of unspecified bronchus or lung: Secondary | ICD-10-CM | POA: Diagnosis not present

## 2016-08-02 DIAGNOSIS — D649 Anemia, unspecified: Secondary | ICD-10-CM | POA: Diagnosis not present

## 2016-08-02 DIAGNOSIS — F17220 Nicotine dependence, chewing tobacco, uncomplicated: Secondary | ICD-10-CM | POA: Diagnosis not present

## 2016-08-02 LAB — COMPREHENSIVE METABOLIC PANEL WITH GFR
ALT: 12 U/L — ABNORMAL LOW (ref 17–63)
AST: 19 U/L (ref 15–41)
Albumin: 3.8 g/dL (ref 3.5–5.0)
Alkaline Phosphatase: 51 U/L (ref 38–126)
Anion gap: 7 (ref 5–15)
BUN: 29 mg/dL — ABNORMAL HIGH (ref 6–20)
CO2: 24 mmol/L (ref 22–32)
Calcium: 9.1 mg/dL (ref 8.9–10.3)
Chloride: 99 mmol/L — ABNORMAL LOW (ref 101–111)
Creatinine, Ser: 1.34 mg/dL — ABNORMAL HIGH (ref 0.61–1.24)
GFR calc Af Amer: 59 mL/min — ABNORMAL LOW
GFR calc non Af Amer: 51 mL/min — ABNORMAL LOW
Glucose, Bld: 100 mg/dL — ABNORMAL HIGH (ref 65–99)
Potassium: 3.9 mmol/L (ref 3.5–5.1)
Sodium: 130 mmol/L — ABNORMAL LOW (ref 135–145)
Total Bilirubin: 1.1 mg/dL (ref 0.3–1.2)
Total Protein: 8.1 g/dL (ref 6.5–8.1)

## 2016-08-02 LAB — CBC WITH DIFFERENTIAL/PLATELET
Basophils Absolute: 0 10*3/uL (ref 0–0.1)
Basophils Relative: 1 %
Eosinophils Absolute: 0.2 10*3/uL (ref 0–0.7)
Eosinophils Relative: 4 %
HCT: 30.5 % — ABNORMAL LOW (ref 40.0–52.0)
Hemoglobin: 10.7 g/dL — ABNORMAL LOW (ref 13.0–18.0)
Lymphocytes Relative: 4 %
Lymphs Abs: 0.3 10*3/uL — ABNORMAL LOW (ref 1.0–3.6)
MCH: 32.6 pg (ref 26.0–34.0)
MCHC: 35 g/dL (ref 32.0–36.0)
MCV: 93.1 fL (ref 80.0–100.0)
Monocytes Absolute: 0.4 10*3/uL (ref 0.2–1.0)
Monocytes Relative: 6 %
Neutro Abs: 5.5 10*3/uL (ref 1.4–6.5)
Neutrophils Relative %: 85 %
Platelets: 69 10*3/uL — ABNORMAL LOW (ref 150–440)
RBC: 3.28 MIL/uL — ABNORMAL LOW (ref 4.40–5.90)
RDW: 21.1 % — ABNORMAL HIGH (ref 11.5–14.5)
WBC: 6.5 10*3/uL (ref 3.8–10.6)

## 2016-08-02 LAB — TSH: TSH: 1.452 u[IU]/mL (ref 0.350–4.500)

## 2016-08-02 MED ORDER — ALBUTEROL SULFATE HFA 108 (90 BASE) MCG/ACT IN AERS: 2.0000 | INHALATION_SPRAY | Freq: Four times a day (QID) | RESPIRATORY_TRACT | Status: DC | PRN

## 2016-08-02 MED ORDER — AZITHROMYCIN 500 MG PO TABS
250.0000 mg | ORAL_TABLET | Freq: Every day | ORAL | Status: DC
  Administered 2016-08-03: 08:00:00 250 mg via ORAL
  Filled 2016-08-02: qty 1

## 2016-08-02 MED ORDER — ACETAMINOPHEN 650 MG RE SUPP: 650.0000 mg | Freq: Four times a day (QID) | RECTAL | Status: DC | PRN

## 2016-08-02 MED ORDER — CEFTRIAXONE SODIUM 1 G IJ SOLR
1.0000 g | INTRAMUSCULAR | Status: DC
  Administered 2016-08-02 (×2): 1 g via INTRAVENOUS
  Filled 2016-08-02 (×2): qty 10

## 2016-08-02 MED ORDER — ALBUTEROL SULFATE (2.5 MG/3ML) 0.083% IN NEBU: 2.5000 mg | INHALATION_SOLUTION | Freq: Four times a day (QID) | RESPIRATORY_TRACT | Status: DC | PRN

## 2016-08-02 MED ORDER — ONDANSETRON HCL 4 MG PO TABS: 4.0000 mg | ORAL_TABLET | Freq: Four times a day (QID) | ORAL | Status: DC | PRN

## 2016-08-02 MED ORDER — ENOXAPARIN SODIUM 40 MG/0.4ML ~~LOC~~ SOLN
40.0000 mg | SUBCUTANEOUS | Status: DC
  Administered 2016-08-02: 20:00:00 40 mg via SUBCUTANEOUS
  Filled 2016-08-02: qty 0.4

## 2016-08-02 MED ORDER — METHYLPREDNISOLONE SODIUM SUCC 125 MG IJ SOLR
60.0000 mg | Freq: Every day | INTRAMUSCULAR | Status: DC
  Administered 2016-08-02: 60 mg via INTRAVENOUS
  Filled 2016-08-02: qty 2

## 2016-08-02 MED ORDER — ACETAMINOPHEN 325 MG PO TABS: 325.0000 mg | ORAL_TABLET | Freq: Four times a day (QID) | ORAL | Status: DC | PRN

## 2016-08-02 MED ORDER — SENNOSIDES-DOCUSATE SODIUM 8.6-50 MG PO TABS: 1.0000 | ORAL_TABLET | Freq: Every evening | ORAL | Status: DC | PRN

## 2016-08-02 MED ORDER — BISACODYL 5 MG PO TBEC: 5.0000 mg | DELAYED_RELEASE_TABLET | Freq: Every day | ORAL | Status: DC | PRN

## 2016-08-02 MED ORDER — EZETIMIBE 10 MG PO TABS
10.0000 mg | ORAL_TABLET | Freq: Every day | ORAL | Status: DC
  Administered 2016-08-02 – 2016-08-03 (×2): 10 mg via ORAL
  Filled 2016-08-02 (×2): qty 1

## 2016-08-02 MED ORDER — GEMFIBROZIL 600 MG PO TABS
600.0000 mg | ORAL_TABLET | Freq: Every day | ORAL | Status: DC
  Administered 2016-08-02: 600 mg via ORAL
  Filled 2016-08-02 (×2): qty 1

## 2016-08-02 MED ORDER — AMLODIPINE BESYLATE 10 MG PO TABS
10.0000 mg | ORAL_TABLET | Freq: Every day | ORAL | Status: DC
  Administered 2016-08-03: 10 mg via ORAL
  Filled 2016-08-02: qty 1

## 2016-08-02 MED ORDER — ONDANSETRON HCL 4 MG/2ML IJ SOLN: 4.0000 mg | Freq: Four times a day (QID) | INTRAMUSCULAR | Status: DC | PRN

## 2016-08-02 MED ORDER — MOMETASONE FURO-FORMOTEROL FUM 200-5 MCG/ACT IN AERO
2.0000 | INHALATION_SPRAY | Freq: Two times a day (BID) | RESPIRATORY_TRACT | Status: DC
  Administered 2016-08-02 – 2016-08-03 (×2): 2 via RESPIRATORY_TRACT
  Filled 2016-08-02: qty 8.8

## 2016-08-02 MED ORDER — ENSURE ENLIVE PO LIQD
237.0000 mL | Freq: Two times a day (BID) | ORAL | Status: DC
  Administered 2016-08-03: 237 mL via ORAL

## 2016-08-02 MED ORDER — AZITHROMYCIN 500 MG PO TABS
500.0000 mg | ORAL_TABLET | Freq: Every day | ORAL | Status: AC
  Administered 2016-08-02: 500 mg via ORAL
  Filled 2016-08-02: qty 1

## 2016-08-02 MED ORDER — ACETAMINOPHEN 325 MG PO TABS: 650.0000 mg | ORAL_TABLET | Freq: Four times a day (QID) | ORAL | Status: DC | PRN

## 2016-08-02 MED ORDER — ASPIRIN 81 MG PO CHEW
81.0000 mg | CHEWABLE_TABLET | Freq: Every day | ORAL | Status: DC
  Administered 2016-08-03: 08:00:00 81 mg via ORAL
  Filled 2016-08-02: qty 1

## 2016-08-02 MED ORDER — SODIUM CHLORIDE 0.9 % IV SOLN
Freq: Once | INTRAVENOUS | Status: AC
  Administered 2016-08-02: 11:00:00 via INTRAVENOUS

## 2016-08-02 MED ORDER — LOSARTAN POTASSIUM 50 MG PO TABS
50.0000 mg | ORAL_TABLET | Freq: Every day | ORAL | Status: DC
  Administered 2016-08-03: 08:00:00 50 mg via ORAL
  Filled 2016-08-02: qty 1

## 2016-08-02 MED ORDER — TRAMADOL HCL 50 MG PO TABS: 50.0000 mg | ORAL_TABLET | Freq: Four times a day (QID) | ORAL | Status: DC | PRN

## 2016-08-02 NOTE — Evaluation (Signed)
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  On room air his O2 was mid/low 90s at rest and dropped to 88-90 with light bed mobility activity.  He was able to get sats up to 94% with focused breathing but during prolonged ambulation (250 ft, steps) his sats dropped to the low 80s on room air and though he did not have significant shortness of breath of c/o fatigue he was somewhat tired.  Pt's stats quickly increased back to 90s on 1.5 liters.  Pt has no needs for further PT intervention, possible need of home O2 per remainder of hospital course.    Follow Up Recommendations No PT follow up    Equipment Recommendations       Recommendations for Other Services       Precautions / Restrictions Restrictions Weight Bearing Restrictions: No      Mobility  Bed Mobility Overal bed mobility: Independent             General bed mobility comments: no issues getting to EOB  Transfers Overall transfer level: Independent Equipment used: None             General transfer comment: Pt showed good safety and confidence getting to standing  Ambulation/Gait Ambulation/Gait assistance: Independent Ambulation Distance (Feet): 250 Feet Assistive device: None       General Gait Details: Pt walked with good speed and confidence.  He did not report excessive fatigue, but on room air his O2 did drop to the low 80s with the effort of prolonged ambulation.  Overall pt had no PT needs, but  activity tolerance/O2 sats were an issue  Stairs Stairs: Yes Stairs assistance: Modified independent (Device/Increase time) Stair Management: No rails Number of Stairs: 6 General stair comments: Pt able to easily negotiate up/down steps with reciprocal pattern.  Wheelchair Mobility    Modified Rankin (Stroke Patients Only)       Balance Overall balance assessment: Independent                                           Pertinent Vitals/Pain Pain Assessment: No/denies pain    Home Living Family/patient expects to be discharged to:: Private residence Living Arrangements: Alone Available Help at Discharge: Family (daughter lives next door and helps PRN)   Home Access: Stairs to enter Entrance Stairs-Rails: None Entrance Stairs-Number of Steps: 2          Prior Function Level of Independence: Independent         Comments: Pt has not been driving much recently secondary to feeling poorly related to radiation/chemo     Hand Dominance        Extremity/Trunk Assessment   Upper Extremity Assessment Upper Extremity Assessment: Overall WFL for tasks assessed    Lower Extremity Assessment Lower Extremity Assessment: Overall WFL for tasks assessed       Communication   Communication: No difficulties  Cognition Arousal/Alertness: Awake/alert Behavior During Therapy: WFL for tasks assessed/performed Overall Cognitive Status: Within  Functional Limits for tasks assessed                                        General Comments      Exercises     Assessment/Plan    PT Assessment Patent does not need any further PT services  PT Problem List         PT Treatment Interventions      PT Goals (Current goals can be found in the Care Plan section)  Acute Rehab PT Goals PT Goal Formulation: All assessment and education complete, DC therapy    Frequency     Barriers to discharge        Co-evaluation                AM-PAC PT "6 Clicks" Daily Activity  Outcome Measure Difficulty turning over in bed (including adjusting bedclothes, sheets and blankets)?: None Difficulty moving from lying on back to sitting on the side of the bed? : None Difficulty sitting down on and standing up from a chair with arms (e.g., wheelchair, bedside commode, etc,.)?: None Help needed moving to and from a bed to chair (including a wheelchair)?: None Help needed walking in hospital room?: None Help needed climbing 3-5 steps with a railing? : None 6 Click Score: 24    End of Session Equipment Utilized During Treatment: Gait belt (O2 sats quickly increased to low 90s with 1.5 liters O2 ) Activity Tolerance: Patient tolerated treatment well Patient left: in chair;with call bell/phone within reach;with family/visitor present Nurse Communication:  (O2 status with ambulation) PT Visit Diagnosis: Muscle weakness (generalized) (M62.81)    Time: 0762-2633 PT Time Calculation (min) (ACUTE ONLY): 25 min   Charges:   PT Evaluation $PT Eval Low Complexity: 1 Procedure     PT G CodesKreg Cortez, DPT 08/02/2016, 1:17 PM

## 2016-08-02 NOTE — H&P (Signed)
[facility] [place] NAME: Marcus Cortez    MR#:  [id number]  DATE OF BIRTH:  09-Apr-1945  DATE OF ADMISSION:  08/02/2016  PRIMARY CARE PHYSICIAN: Kathrine Haddock, NP   REQUESTING/REFERRING PHYSICIAN: Dr. Yevette Edwards  CHIEF COMPLAINT:   Increasing shortness of breath and tachypnea  patient is sent from the [facility] as a direct admit HISTORY OF PRESENT ILLNESS:  Marcus Cortez  is a 71 y.o. male with a known history of Stage III carcinoma of the lung was undergoing Chemoradiation 4 weeks ago is sent in from oncology office with tachypnea and recent CT done 2 days ago with increased infiltrates bilaterally suggestive of pneumonia. Patient was started on Levaquin. He continues to use of breath and some productive phlegm. Denies hemoptysis. He denies any chest pain. No fever. Patient is being admitted with bilateral pneumonia in the setting of stage III lung cancer. we'll start him on IV Rocephin and Zithromax.  PAST MEDICAL HISTORY:   Past Medical History:  Diagnosis Date  . Allergy   . Cancer ([place]) 03/2016   lung  . Carotid artery bruit   . CINV (chemotherapy-induced nausea and vomiting)   . Emphysema lung ([place])   . Emphysema of lung ([place])   . Former smoker   . Heart murmur   . Hyperlipidemia   . Hypertension   . IFG (impaired fasting glucose)   . Osteoporosis   . Pulmonary stenosis     PAST SURGICAL HISTOIRY:   Past Surgical History:  Procedure Laterality Date  . CAROTID PTA/STENT INTERVENTION Right 05/22/2016   Procedure: Carotid PTA/Stent Intervention;  Surgeon: Katha Cabal, MD;  Location: [facility];  Service: Cardiovascular;  Laterality: Right;  . EYE SURGERY  2010   cataract  . FLEXIBLE BRONCHOSCOPY N/A 04/10/2016   Procedure: FLEXIBLE BRONCHOSCOPY;  Surgeon: Wilhelmina Mcardle, MD;  Location: [facility];  Service: Pulmonary;  Laterality: N/A;    SOCIAL HISTORY:   Social History  Substance Use Topics   . Smoking status: Former Smoker    Packs/day: 0.50    Years: 35.00    Types: Cigarettes    Quit date: 01/10/2015  . Smokeless tobacco: Former Systems developer    Types: Chew    Quit date: 04/10/1978  . Alcohol use No    FAMILY HISTORY:   Family History  Problem Relation Age of Onset  . Hypertension Mother   . Thyroid disease Mother   . Glaucoma Mother   . Alzheimer's disease Father   . Stroke Maternal Grandmother   . Cancer Maternal Grandfather   . Alzheimer's disease Paternal Grandmother   . Stroke Paternal Grandfather     DRUG ALLERGIES:  No Known Allergies  REVIEW OF SYSTEMS:  Review of Systems  Constitutional: Negative for chills, fever and weight loss.  HENT: Negative for ear discharge, ear pain and nosebleeds.   Eyes: Negative for blurred vision, pain and discharge.  Respiratory: Positive for cough and shortness of breath. Negative for sputum production, wheezing and stridor.   Cardiovascular: Negative for chest pain, palpitations, orthopnea and PND.  Gastrointestinal: Negative for abdominal pain, diarrhea, nausea and vomiting.  Genitourinary: Negative for frequency and urgency.  Musculoskeletal: Negative for back pain and joint pain.  Neurological: Positive for weakness. Negative for sensory change, speech change and focal weakness.  Psychiatric/Behavioral: Negative for depression and hallucinations. The patient is not nervous/anxious.      MEDICATIONS AT HOME:   Prior to Admission medications  Medication Sig Start Date End Date Taking? Authorizing Provider  acetaminophen (TYLENOL) 325 MG tablet Take 325-650 mg by mouth every 6 (six) hours as needed (for pain/headache.).    Yes [provider]  albuterol (PROVENTIL HFA;VENTOLIN HFA) 108 (90 Base) MCG/ACT inhaler Inhale 2 puffs into the lungs every 6 (six) hours as needed for wheezing or shortness of breath. 07/19/16  Yes Cammie Sickle, MD  amLODipine (NORVASC) 10 MG tablet Take 1 tablet (10 mg total) by  mouth daily. 05/13/16  Yes Minna Merritts, MD  aspirin 81 MG tablet Take 81 mg by mouth daily.    Yes [provider]  ezetimibe (ZETIA) 10 MG tablet Take 1 tablet (10 mg total) by mouth daily. 05/13/16 05/13/17 Yes Gollan, Kathlene November, MD  Fluticasone-Salmeterol (ADVAIR DISKUS) 500-50 MCG/DOSE AEPB Inhale 1 puff into the lungs 2 (two) times daily. 07/19/16  Yes Cammie Sickle, MD  gemfibrozil (LOPID) 600 MG tablet Take 600 mg by mouth at bedtime.    Yes [provider]  levofloxacin (LEVAQUIN) 500 MG tablet Take 1 tablet (500 mg total) by mouth daily. 07/31/16  Yes Cammie Sickle, MD  losartan (COZAAR) 100 MG tablet Take 1 tablet (100 mg total) by mouth daily. Patient taking differently: Take 50 mg by mouth daily. BASED ON BLOOD PRESSURE READINGS 05/13/16 05/13/17 Yes Gollan, Kathlene November, MD  predniSONE (DELTASONE) 20 MG tablet Take 2 pills once a day x 10 days; and then once a day x 1 week Patient taking differently: Take 40 mg by mouth every morning. Take 2 pills once a day x 10 days; and then once a day x 1 week 08/01/16  Yes Cammie Sickle, MD  chlorpheniramine-HYDROcodone (TUSSIONEX) 10-8 MG/5ML SUER Take 5 mLs by mouth every 12 (twelve) hours as needed for cough. Patient not taking: Reported on 08/02/2016 07/22/16   Cammie Sickle, MD  ondansetron (ZOFRAN) 8 MG tablet Take 1 tablet (8 mg total) by mouth every 8 (eight) hours as needed for nausea or vomiting (start 3 days; after chemo). Patient not taking: Reported on 08/02/2016 05/29/16   Cammie Sickle, MD  prochlorperazine (COMPAZINE) 10 MG tablet Take 1 tablet (10 mg total) by mouth every 6 (six) hours as needed for nausea or vomiting. Patient not taking: Reported on 08/02/2016 05/29/16   Cammie Sickle, MD  sucralfate (CARAFATE) 1 g tablet Take 1 tablet (1 g total) by mouth 3 (three) times daily. Dissolve in 2-3 tbsp warm water, swish and swallow Patient not taking: Reported on 08/02/2016 06/04/16    Noreene Filbert, MD      VITAL SIGNS:  Blood pressure (!) 96/59, pulse (!) 101, temperature 98.1 F (36.7 C), temperature source Oral, resp. rate 16, height _0  (1.854 m), weight 88.4 kg (194 lb 12.8 oz), SpO2 94 %.  PHYSICAL EXAMINATION:  GENERAL:  71 y.o.-year-old patient lying in the bed with no acute distress.  EYES: Pupils equal, round, reactive to light and accommodation. No scleral icterus. Extraocular muscles intact.  HEENT: Head atraumatic, normocephalic. Oropharynx and nasopharynx clear.  NECK:  Supple, no jugular venous distention. No thyroid enlargement, no tenderness.  LUNGS: Normal breath sounds bilaterally, no wheezing, rales,rhonchi or crepitation. No use of accessory muscles of respiration.  CARDIOVASCULAR: S1, S2 normal. No murmurs, rubs, or gallops.  ABDOMEN: Soft, nontender, nondistended. Bowel sounds present. No organomegaly or mass.  EXTREMITIES: No pedal edema, cyanosis, or clubbing.  NEUROLOGIC: Cranial nerves II through XII are intact. Muscle strength 5/5 in  all extremities. Sensation intact. Gait not checked.  PSYCHIATRIC: The patient is alert and oriented x 3.  SKIN: No obvious rash, lesion, or ulcer.   LABORATORY PANEL:   CBC  Recent Labs Lab 08/02/16 0809  WBC 6.5  HGB 10.7*  HCT 30.5*  PLT 69*   ------------------------------------------------------------------------------------------------------------------  Chemistries   Recent Labs Lab 08/02/16 0809  NA 130*  K 3.9  CL 99*  CO2 24  GLUCOSE 100*  BUN 29*  CREATININE 1.34*  CALCIUM 9.1  AST 19  ALT 12*  ALKPHOS 51  BILITOT 1.1   ------------------------------------------------------------------------------------------------------------------  Cardiac Enzymes No results for input(s): TROPONINI in the last 168 hours. ------------------------------------------------------------------------------------------------------------------  RADIOLOGY:  No results found.  EKG:     IMPRESSION AND PLAN:   Marcus Cortez  is a 71 y.o. male with a known history of Stage III carcinoma of the lung was undergoing Chemoradiation 4 weeks ago is sent in from oncology office with tachypnea and recent CT done 2 days ago with increased infiltrates bilaterally suggestive of pneumonia.  1. Multifocal pneumonia in the setting of stage III carcinoma of the lung undergoing chemotherapy -Admit to medical floor -IV Rocephin and Zithromax -Patient afebrile white count normal -Pulmonary consultation\ -Nebulizer when necessary, inhalers when necessary  2. COPD/emphysema with mild exacerbation -IV Solu-Medrol, when necessary meds, inhalers  3. Hyponatremia -IV fluids  4. Hypertension -Resume home meds  5. DVT prophylaxis subcutaneous Lovenox  Above was discussed with patient and family agreeable to it.  All the records are reviewed and case discussed with ED provider. Management plans discussed with the patient, family and they are in agreement.  CODE STATUS: Full  TOTAL TIME TAKING CARE OF THIS PATIENT: *50* minutes.    Marcus Cortez M.D on 08/02/2016 at 1:04 PM  Between 7am to 6pm - Pager - [phone]  After 6pm go to [email or web address] - password [email or web address] [facility]  [facility]  Office  [phone]  CC: Primary care physician; Kathrine Haddock, NP

## 2016-08-02 NOTE — Progress Notes (Addendum)
[facility] Consultation    ASSESSMENT/PLAN   Patient with left upper lobe adenocarcinoma, status post radiation and chemotherapy with presentation consistent with multilobar pneumonia. CT scan reveals centrilobular emphysema with component of fibrosis, radiation changes in the left lung zone with diffuse multi lobar parenchymal infiltrates. Agree with azithromycin, Rocephin, short acting bronchodilators include albuterol/Atrovent, Solu-Medrol empirically with transition to prednisone and taper. Patient is doing well at this time on room air with stable vital signs and oxygenation. Spoke with Dr. Posey Pronto who will be transitioning him to oral medication with plans on early discharge. He will follow-up with his primary oncologist with follow-up CT scan of the chest. No additional recommendations from pulmonary point of view. We'll sign off, please call if can be of any assistance  Hyponatremia. Would rehydrate with normal saline  Renal insufficiency. Follow closely  Mild anemia. Stable without evidence of active bleeding  Name: Marcus Cortez MRN: [id number] DOB: 08/09/1945    ADMISSION DATE:  08/02/2016 CONSULTATION DATE:    REFERRING MD :  Dr. 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 Was in for a follow-up with his oncologist, was noted to have increasing cough, congestion, sputum production, fever, chills. CT scan of the chest was performed in follow-up noted to have multi lobar pneumonia. Was subsequently admitted to the hospital for antibiotic therapy and  further workup. Patient was admitted, started on azithromycin, Rocephin, Solu-Medrol along with short-acting bronchodilator therapy. He states he is feeling better today. Is presently on room air with stable oxygenation and vital signs.  PAST MEDICAL HISTORY :  Past Medical History:  Diagnosis Date  . Allergy   . Cancer ([place]) 03/2016   lung  . Carotid artery bruit   . CINV (chemotherapy-induced nausea and vomiting)   . Emphysema lung ([place])   . Emphysema of lung ([place])   . Former smoker   . Heart murmur   . Hyperlipidemia   . Hypertension   . IFG (impaired fasting glucose)   . Osteoporosis   . Pulmonary stenosis    Past Surgical History:  Procedure Laterality Date  . CAROTID PTA/STENT INTERVENTION Right 05/22/2016   Procedure: Carotid PTA/Stent Intervention;  Surgeon: Katha Cabal, MD;  Location: [facility];  Service: Cardiovascular;  Laterality: Right;  . EYE SURGERY  2010   cataract  . FLEXIBLE BRONCHOSCOPY N/A 04/10/2016   Procedure: FLEXIBLE BRONCHOSCOPY;  Surgeon: Wilhelmina Mcardle, MD;  Location: [facility];  Service: Pulmonary;  Laterality: N/A;   Prior to Admission medications   Medication Sig Start Date End Date Taking? Authorizing Provider  acetaminophen (TYLENOL) 325 MG tablet Take 325-650 mg by mouth every 6 (six) hours as needed (for pain/headache.).    Yes [provider]  albuterol (PROVENTIL HFA;VENTOLIN HFA) 108 (90 Base) MCG/ACT inhaler Inhale 2 puffs into the lungs every 6 (six) hours as needed for wheezing or shortness of breath. 07/19/16  Yes Cammie Sickle, MD  amLODipine (NORVASC) 10 MG tablet Take 1 tablet (10 mg total) by mouth daily. 05/13/16  Yes Minna Merritts, MD  aspirin 81 MG tablet Take 81 mg by mouth daily.    Yes [provider]  ezetimibe (ZETIA) 10 MG tablet Take 1 tablet (10 mg total) by mouth daily. 05/13/16 05/13/17 Yes Gollan, Kathlene November, MD  Fluticasone-Salmeterol (ADVAIR DISKUS) 500-50 MCG/DOSE AEPB Inhale 1 puff  into the lungs 2 (two) times daily. 07/19/16  Yes Cammie Sickle, MD  gemfibrozil (LOPID) 600 MG tablet Take 600 mg by mouth at bedtime.    Yes [provider]  levofloxacin (LEVAQUIN) 500 MG tablet Take 1 tablet (500 mg total) by mouth daily. 07/31/16  Yes Cammie Sickle, MD  losartan (COZAAR) 100 MG tablet Take 1 tablet (100 mg total) by mouth daily. Patient taking differently: Take 50 mg by mouth daily. BASED ON BLOOD PRESSURE READINGS 05/13/16 05/13/17 Yes Gollan, Kathlene November, MD  predniSONE (DELTASONE) 20 MG tablet Take 2 pills once a day x 10 days; and then once a day x 1 week Patient taking differently: Take 40 mg by mouth every morning. Take 2 pills once a day x 10 days; and then once a day x 1 week 08/01/16  Yes Cammie Sickle, MD  chlorpheniramine-HYDROcodone (TUSSIONEX) 10-8 MG/5ML SUER Take 5 mLs by mouth every 12 (twelve) hours as needed for cough. Patient not taking: Reported on 08/02/2016 07/22/16   Cammie Sickle, MD  ondansetron (ZOFRAN) 8 MG tablet Take 1 tablet (8 mg total) by mouth every 8 (eight) hours as needed for nausea or vomiting (start 3 days; after chemo). Patient not taking: Reported on 08/02/2016 05/29/16   Cammie Sickle, MD  prochlorperazine (COMPAZINE) 10 MG tablet Take 1 tablet (10 mg total) by mouth every 6 (six) hours as needed for nausea or vomiting. Patient not taking: Reported on 08/02/2016 05/29/16   Cammie Sickle, MD  sucralfate (CARAFATE) 1 g tablet Take 1 tablet (1 g total) by mouth 3 (three) times daily. Dissolve in 2-3 tbsp warm water, swish and swallow Patient not taking: Reported on 08/02/2016 06/04/16   Noreene Filbert, MD   No Known Allergies  FAMILY HISTORY:  Family History  Problem Relation Age of Onset  . Hypertension Mother   . Thyroid disease Mother   . Glaucoma Mother   . Alzheimer's disease Father   . Stroke Maternal Grandmother   . Cancer Maternal Grandfather   . Alzheimer's disease Paternal  Grandmother   . Stroke Paternal Grandfather    SOCIAL HISTORY:  reports that he quit smoking about 18 months ago. His smoking use included Cigarettes. He has a 17.50 pack-year smoking history. He quit smokeless tobacco use about 38 years ago. His smokeless tobacco use included Chew. He reports that he does not drink alcohol or use drugs.  REVIEW OF SYSTEMS:   Constitutional: Feels well. Cardiovascular: No chest pain.  Pulmonary: Denies dyspnea.   The remainder of systems were reviewed and were found to be negative other than what is documented in the HPI.    VITAL SIGNS: Temp:  [98.1 F (36.7 C)-99.7 F (37.6 C)] 98.1 F (36.7 C) (05/25 0958) Pulse Rate:  [101-109] 101 (05/25 0958) Resp:  [16-30] 16 (05/25 0958) BP: (91-96)/(58-59) 96/59 (05/25 0958) SpO2:  [94 %-95 %] 94 % (05/25 0958) Weight:  [88.4 kg (194 lb 12.8 oz)-89.1 kg (196 lb 6 oz)] 88.4 kg (194 lb 12.8 oz) (05/25 0958)     Physical Examination:   VS: BP (!) 96/59 (BP Location: Left Arm)   Pulse (!) 101   Temp 98.1 F (36.7 C) (Oral)   Resp 16   Ht _0  (1.854 m)   Wt  88.4 kg (194 lb 12.8 oz)   SpO2 94%   BMI 25.70 kg/m   General Appearance: No distress  Neuro:without focal findings, mental status, speech normal,. HEENT: PERRLA, EOM intact, no ptosis, no other lesions noticed;  Pulmonary: No wheezing noted at this time, and territory crackles appreciated in the left anterior chest, diminished breath sounds bilaterally with prolonged expiratory phase CardiovascularNormal S1,S2.  No m/r/g.    Abdomen: Benign, Soft, non-tender, No masses, hepatosplenomegaly, No lymphadenopathy Renal:  No costovertebral tenderness . Skin:   warm, no rashes, no ecchymosis  Extremities: normal, no cyanosis, clubbing, no edema, warm with normal capillary refill.    LABS: Reviewed   LABORATORY PANEL:   CBC  Recent Labs Lab 08/02/16 0809  WBC 6.5  HGB 10.7*  HCT 30.5*  PLT 69*    Chemistries   Recent Labs Lab  08/02/16 0809  NA 130*  K 3.9  CL 99*  CO2 24  GLUCOSE 100*  BUN 29*  CREATININE 1.34*  CALCIUM 9.1  AST 19  ALT 12*  ALKPHOS 51  BILITOT 1.1    No results for input(s): GLUCAP in the last 168 hours. No results for input(s): PHART, PCO2ART, PO2ART in the last 168 hours.  Recent Labs Lab 08/02/16 0809  AST 19  ALT 12*  ALKPHOS 51  BILITOT 1.1  ALBUMIN 3.8    Cardiac Enzymes No results for input(s): TROPONINI in the last 168 hours.  RADIOLOGY:  No results found.    Hermelinda Dellen, DO [facility] Office Number: [phone]  Patricia Pesa, M.D.  Merton Border, M.D   08/02/2016, 12:03 PM

## 2016-08-02 NOTE — Assessment & Plan Note (Addendum)
#   Left upper lobe lung cancer- status post chemoradiation [finished approximately 6 weeks ago]. CT scan shows slight improvement of the left upper lobe malignancy; however infiltrative changes bilateral lungs- infectious versus inflammatory. [C discussion below].  # Hold immunotherapy- Durvalumab- as planned today- given the acute respiratory issues [C discussion below].  # Infiltrative changes noted- left lung more than right- infectious/inflammatory [less likely radiation]. Symptomatic- failed oral antibiotic therapy. Patient noncompliant with steroids. Discussed with pulmonary. Recommend admission the hospital for IV antibiotics/IV steroids. Discussed with Dr. Posey Pronto.  # Moderate thrombocytopenia- 66/ sec to chemo  ITP vs others. Monitor for now.  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    # Floaters/visual disturbance-question concerning for retinal bleed- off Plavix.   # The above plan of care discussed with the patient and his daughter in detail. Patient will be directly admitted to the hospital today.   # I reviewed the blood work- with the patient in detail; also reviewed the imaging independently [as summarized above]; and with the patient in detail.

## 2016-08-02 NOTE — Progress Notes (Signed)
[place] NOTE  Patient Care Team: Kathrine Haddock, NP as PCP - General (Nurse Practitioner) Minna Merritts, MD as Consulting Physician (Cardiology)  CHIEF COMPLAINTS/PURPOSE OF CONSULTATION:  Oncology History   # FEB 2018- [place] CA LUL [CT Bx] T4N1 [abutting mediastinum; Mediastinal LN on CT; Neg on PET]; NOT surgical candidate [Dr.Oaks/ Dr.Henderickson; [place]];   # Carbo-Taxol RT [[facility]]  # Mild Thrombocytopenia [130s?]  # MILD THROMBOCYTOPENIA [130-140s]; Feb 2018- MR brain-NEG  # Bil Carotid stenosis [R>90% s/p CEA on 05/22/16; L~50%; Dr.Schneir].   # MOL Testing- ROS-1;ALK/EGFR/B-RAF; PDL-1- 1%.      Primary cancer of left upper lobe of lung ([place])    HISTORY OF PRESENTING ILLNESS:  Marcus Cortez 71 y.o.  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  Interim patient noted to have worsening shortness of breath; especially with exertion. Worsening cough. Poor energy levels. Positive for nausea no vomiting. Fevers up to 102. Chills and episodes of sweats.  Patient was recently started on Levaquin 500 mg a day; and also prednisone- unfortunately not compliant with prednisone.  Patient has hadpoor appetite. Positive for weight loss. Extremely poor energy levels. Denies any pain.No tingling or numbness.   ROS: A complete 10 point review of system is done which is negative except mentioned above in history of present illness  MEDICAL HISTORY:  Past Medical History:  Diagnosis Date  . Allergy   . Cancer ([place]) 03/2016   lung  . Carotid artery bruit   . CINV (chemotherapy-induced nausea and vomiting)   . Emphysema lung ([place])   . Emphysema of lung ([place])   . Former smoker   . Heart murmur   . Hyperlipidemia   . Hypertension   . IFG (impaired fasting glucose)   . Osteoporosis    . Pulmonary stenosis     SURGICAL HISTORY: Past Surgical History:  Procedure Laterality Date  . CAROTID PTA/STENT INTERVENTION Right 05/22/2016   Procedure: Carotid PTA/Stent Intervention;  Surgeon: Katha Cabal, MD;  Location: [facility];  Service: Cardiovascular;  Laterality: Right;  . EYE SURGERY  2010   cataract  . 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 Previous history of smoking half a pack a day for 25-30 years. Quit approximately 2 years ago. Social History   Social History  . Marital status: Widowed    Spouse name: N/A  . Number of children: N/A  . Years of education: N/A   Occupational History  . Not on file.   Social History Main Topics  . Smoking status: Former Smoker    Packs/day: 0.50    Years: 35.00    Types: Cigarettes    Quit date: 01/10/2015  . Smokeless tobacco: Former Systems developer    Types: Chew    Quit date: 04/10/1978  . Alcohol use No  . Drug use: No  . Sexual activity: Yes   Other Topics Concern  . Not on file   Social History Narrative  . No narrative on file    FAMILY HISTORY: Family History  Problem Relation Age of Onset  . Hypertension Mother   . Thyroid disease Mother   . Glaucoma  Mother   . Alzheimer's disease Father   . Stroke Maternal Grandmother   . Cancer Maternal Grandfather   . Alzheimer's disease Paternal Grandmother   . Stroke Paternal Grandfather     ALLERGIES:  has No Known Allergies.  MEDICATIONS:  No current facility-administered medications for this visit.    No current outpatient prescriptions on file.   Facility-Administered Medications Ordered in Other Visits  Medication Dose Route Frequency Provider Last Rate Last Dose  . acetaminophen (TYLENOL) tablet 650 mg  650 mg Oral Q6H PRN Fritzi Mandes, MD        Or  . acetaminophen (TYLENOL) suppository 650 mg  650 mg Rectal Q6H PRN Fritzi Mandes, MD      . albuterol (PROVENTIL) (2.5 MG/3ML) 0.083% nebulizer solution 2.5 mg  2.5 mg Nebulization Q6H PRN Fritzi Mandes, MD      . Derrill Memo ON 08/03/2016] amLODipine (NORVASC) tablet 10 mg  10 mg Oral Daily Fritzi Mandes, MD      . Derrill Memo ON 08/03/2016] aspirin chewable tablet 81 mg  81 mg Oral Daily Fritzi Mandes, MD      . Derrill Memo ON 08/03/2016] azithromycin (ZITHROMAX) tablet 250 mg  250 mg Oral Daily Fritzi Mandes, MD      . bisacodyl (DULCOLAX) EC tablet 5 mg  5 mg Oral Daily PRN Fritzi Mandes, MD      . cefTRIAXone (ROCEPHIN) 1 g in dextrose 5 % 50 mL IVPB  1 g Intravenous Q24H Fritzi Mandes, MD   Stopped at 08/02/16 1356  . enoxaparin (LOVENOX) injection 40 mg  40 mg Subcutaneous Q24H Fritzi Mandes, MD      . ezetimibe (ZETIA) tablet 10 mg  10 mg Oral Daily Fritzi Mandes, MD   10 mg at 08/02/16 1326  . [START ON 08/03/2016] feeding supplement (ENSURE ENLIVE) (ENSURE ENLIVE) liquid 237 mL  237 mL Oral BID BM Fritzi Mandes, MD      . gemfibrozil (LOPID) tablet 600 mg  600 mg Oral QHS Fritzi Mandes, MD      . Derrill Memo ON 08/03/2016] losartan (COZAAR) tablet 50 mg  50 mg Oral Daily Fritzi Mandes, MD      . methylPREDNISolone sodium succinate (SOLU-MEDROL) 125 mg/2 mL injection 60 mg  60 mg Intravenous Q breakfast Fritzi Mandes, MD   60 mg at 08/02/16 1326  . mometasone-formoterol (DULERA) 200-5 MCG/ACT inhaler 2 puff  2 puff Inhalation BID Fritzi Mandes, MD      . ondansetron [facility]) tablet 4 mg  4 mg Oral Q6H PRN Fritzi Mandes, MD       Or  . ondansetron (ZOFRAN) injection 4 mg  4 mg Intravenous Q6H PRN Fritzi Mandes, MD      . senna-docusate (Senokot-S) tablet 1 tablet  1 tablet Oral QHS PRN Fritzi Mandes, MD      . traMADol Veatrice Bourbon) tablet 50 mg  50 mg Oral Q6H PRN Fritzi Mandes, MD          .  PHYSICAL EXAMINATION: ECOG PERFORMANCE STATUS: 0 - Asymptomatic  Vitals:   08/02/16 0832  BP: (!) 91/58  Pulse: (!) 109  Resp: (!) 30   Temp: 99.7 F (37.6 C)   Filed Weights   08/02/16 0832  Weight: 196 lb 6 oz (89.1 kg)    GENERAL: Well-nourished well-developed; Alert, no distress and comfortable.   With His daughter. Using accessory muscles.pul ox 94 % on RA.  EYES: no pallor or icterus OROPHARYNX: no thrush or ulceration; good dentition  NECK: supple, no masses  felt LYMPH:  no palpable lymphadenopathy in the cervical, axillary or inguinal regions LUNGS: Decreased breath sounds to auscultation on left side and  No wheeze or crackles HEART/CVS: regular rate & rhythm and no murmurs; No lower extremity edema ABDOMEN: abdomen soft, non-tender and normal bowel sounds Musculoskeletal:no cyanosis of digits and no clubbing  PSYCH: alert & oriented x 3 with fluent speech NEURO: no focal motor/sensory deficits SKIN:  no rashes;   LABORATORY DATA:  I have reviewed the data as listed Lab Results  Component Value Date   WBC 6.5 08/02/2016   HGB 10.7 (L) 08/02/2016   HCT 30.5 (L) 08/02/2016   MCV 93.1 08/02/2016   PLT 69 (L) 08/02/2016    Recent Labs  07/03/16 0850  07/19/16 0845 07/25/16 1300 08/02/16 0809  NA 135  < > 135 135 130*  K 3.6  < > 3.3* 3.6 3.9  CL 103  < > 104 103 99*  CO2 26  < > 26 21* 24  GLUCOSE 113*  < > 109* 207* 100*  BUN 24*  < > 28* 32* 29*  CREATININE 1.13  < > 1.22 1.49* 1.34*  CALCIUM 8.8*  < > 9.2 9.0 9.1  GFRNONAA >60  < > 57* 45* 51*  GFRAA >60  < > >60 52* 59*  PROT 7.9  --  7.8  --  8.1  ALBUMIN 3.8  --  4.2  --  3.8  AST 21  --  20  --  19  ALT 16*  --  14*  --  12*  ALKPHOS 57  --  58  --  51  BILITOT 0.7  --  0.9  --  1.1  < > = values in this interval not displayed.  RADIOGRAPHIC STUDIES: I have personally reviewed the radiological images as listed and agreed with the findings in the report. Ct Chest Wo Contrast  Result Date: 07/31/2016 CLINICAL DATA:  Restaging left lung cancer. Recently completed chemotherapy and radiation therapy. EXAM: CT CHEST WITHOUT CONTRAST  TECHNIQUE: Multidetector CT imaging of the chest was performed following the standard protocol without IV contrast. COMPARISON:  Chest CT 04/02/2016 and PET-CT 04/09/2016 FINDINGS: Cardiovascular: The heart is upper limits of normal in size lesion and stable. No pericardial effusion. Stable tortuosity, ectasia and calcification of the thoracic aorta. Stable three-vessel coronary artery calcification. Mediastinum/Nodes: Small scattered mediastinal and hilar lymph nodes which are stable. The esophagus is grossly normal. Lungs/Pleura: Severe chronic lung disease with emphysema and pulmonary scarring. There is also underlying interstitial lung disease with peripheral honeycombing and a cephalocaudal gradient suggesting usual interstitial pneumonitis. New subpleural airspace opacity in the right upper lobe on image number 60 could be a small right or atelectasis. Similar findings on image number 62, 54, 71 and 76. These areas are most likely inflammatory or possibly infectious. A short-term follow-up CT may be helpful to reassess. Fairly extensive radiation changes involving the left paramediastinal long with dense consolidation and irregular air bronchograms. The left upper lobe lung mass is somewhat hard to differentiate from the radiation changes but is estimated at 3.9 x 2.8 cm. This was previously at least 5 x 4.5 cm. Peripheral areas of wedge-shaped densities are noted in the left upper lobe which were likely areas of atelectasis SI and scarring and these appear relatively stable. New left basilar density on image number 107 is likely inflammation or atelectasis. No pleural effusion. Upper Abdomen: No significant findings. No evidence of metastatic disease. Chest wall/ Musculoskeletal: No chest  wall mass, supraclavicular or axillary lymphadenopathy. The thyroid gland is grossly normal. No significant bony findings. No evidence of osseous metastatic disease. IMPRESSION: 1. Extensive radiation changes involving the  left paramediastinal lung with interval decrease in size of the left upper lobe neoplasm. 2. Several new patchy airspace opacities in the right lung most likely inflammatory or infectious. Recommend short-term followup noncontrast chest CT in 3 months to reassess. There is also a similar process at the left lung base. 3. No enlarged mediastinal or hilar nodes. 4. Stable underlying interstitial lung disease and emphysema. 5. No findings for upper abdominal metastatic disease or osseous metastatic disease. Electronically Signed   By: Marijo Sanes M.D.   On: 07/31/2016 14:47    ASSESSMENT & PLAN:   Primary cancer of left upper lobe of lung ([place]) # Left upper lobe lung cancer- status post chemoradiation [finished approximately 6 weeks ago]. CT scan shows slight improvement of the left upper lobe malignancy; however infiltrative changes bilateral lungs- infectious versus inflammatory. [C discussion below].  # Hold immunotherapy- Durvalumab- as planned today- given the acute respiratory issues [C discussion below].  # Infiltrative changes noted- left lung more than right- infectious/inflammatory [less likely radiation]. Symptomatic- failed oral antibiotic therapy. Patient noncompliant with steroids. Discussed with pulmonary. Recommend admission the hospital for IV antibiotics/IV steroids. Discussed with Dr. Posey Pronto.  # Moderate thrombocytopenia- 66/ sec to chemo  ITP vs others. Monitor for now.  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    # Floaters/visual disturbance-question concerning for retinal bleed- off Plavix.   # The above plan of care discussed with the patient and his daughter in detail. Patient will be directly admitted to the hospital today.   # I reviewed the blood work- with the patient in detail; also reviewed the imaging independently [as summarized above]; and with the patient in detail.      Cammie Sickle, MD 08/02/2016 6:08 PM

## 2016-08-02 NOTE — Progress Notes (Signed)
Initial Nutrition Assessment  DOCUMENTATION CODES:   Not applicable  INTERVENTION:  Encouraged ongoing adequate intake of calories and protein.  Recommend Ensure Enlive po BID, each supplement provides 350 kcal and 20 grams of protein. Patient prefers chocolate. Discussed recommendation for patient to increase to drinking Boost Plus BID at home until his appetite returns to normal.   NUTRITION DIAGNOSIS:   Increased nutrient needs related to catabolic illness, cancer and cancer related treatments (stg III lung cancer s/p chemo/XRT) as evidenced by estimated needs.  GOAL:   Patient will meet greater than or equal to 90% of their needs  MONITOR:   PO intake, Supplement acceptance, Labs, Weight trends, I & O's  REASON FOR ASSESSMENT:   Malnutrition Screening Tool    ASSESSMENT:   71 year old male with PMHx of osteoporosis, HLD, emphysema of lung, HTN, stage III adenocarcinoma of left upper lobe s/p chemotherapy and XRT completed 4/27, presents with tachypnea and recent CT with increased bilateral infiltrates now admitted with PNA, mild exacerbation of COPD/emphysema.   Spoke with patient at bedside. He reports he hasn't been eating well the past 2-3 days with his difficulty breathing, but other than that he has usually had an okay appetite, He reports he didn't always feel like eating when he was receiving his treatment, but he made himself eat 5 small meals throughout the day. Reports he ate high-protein food and drank 1 Boost Plus per day. Lately has been moving back towards meal pattern of 3 larger meals during the day, but continues to drink Boost once daily. Denies any N/V, abdominal pain, constipation/diarrhea. Reports he was supposed to start his immunotherapy today before he was admitted.  Reports UBW was 212 lbs as of October 2017. Did not see that weight in chart. Per chart patient was 208.8 lbs on 11/09/2015 and lost 14 lbs (6.7% body weight) over 9 months, which is not  significant for time frame.  Medications reviewed and include: azithromycin, methylprednisolone 60 mg daily, ceftriaxone.   Labs reviewed: Sodium 130, Chloride 99, BUN 29, Creatinine 1.34, Platelets 69.   Nutrition-Focused physical exam completed. Findings are mild-moderate fat depletion, mild-moderate muscle depletion noted in temple region, and no edema.   Patient does not meet criteria for malnutrition at this time.  Diet Order:  Diet regular Room service appropriate? Yes; Fluid consistency: Thin  Skin:  Reviewed, no issues  Last BM:  08/02/2016  Height:   Ht Readings from Last 1 Encounters:  08/02/16 6\' 1"  (1.854 m)    Weight:   Wt Readings from Last 1 Encounters:  08/02/16 194 lb 12.8 oz (88.4 kg)    Ideal Body Weight:  83.6 kg  BMI:  Body mass index is 25.7 kg/m.  Estimated Nutritional Needs:   Kcal:  0177-9390 (MSJ x 1.2-1.4)  Protein:  105-120 grams (1.2-1.4 grams/kg)  Fluid:  2.2 L/day (25 ml/kg)  EDUCATION NEEDS:   No education needs identified at this time  Willey Blade, MS, RD, LDN Pager: [phone] After Hours Pager: [phone]

## 2016-08-02 NOTE — Progress Notes (Signed)
Patient here today for follow up.   

## 2016-08-03 MED ORDER — DEXTROSE 5 % IV SOLN
1.0000 g | Freq: Once | INTRAVENOUS | Status: AC
  Administered 2016-08-03: 1 g via INTRAVENOUS
  Filled 2016-08-03: qty 10

## 2016-08-03 MED ORDER — ALBUTEROL SULFATE HFA 108 (90 BASE) MCG/ACT IN AERS: 1.0000 | INHALATION_SPRAY | Freq: Four times a day (QID) | RESPIRATORY_TRACT | Status: DC | PRN

## 2016-08-03 MED ORDER — AZITHROMYCIN 250 MG PO TABS: 250.0000 mg | ORAL_TABLET | Freq: Every day | ORAL | 0 refills | Status: DC

## 2016-08-03 MED ORDER — ENSURE ENLIVE PO LIQD: 237.0000 mL | Freq: Two times a day (BID) | ORAL | 12 refills | Status: AC

## 2016-08-03 MED ORDER — PREDNISONE 10 MG PO TABS: ORAL_TABLET | ORAL | 0 refills | Status: DC

## 2016-08-03 MED ORDER — PREDNISONE 50 MG PO TABS
50.0000 mg | ORAL_TABLET | Freq: Every day | ORAL | Status: DC
  Administered 2016-08-03: 50 mg via ORAL
  Filled 2016-08-03: qty 1

## 2016-08-03 MED ORDER — CEFUROXIME AXETIL 500 MG PO TABS: 500.0000 mg | ORAL_TABLET | Freq: Two times a day (BID) | ORAL | 0 refills | Status: DC

## 2016-08-03 MED ORDER — CEFUROXIME AXETIL 500 MG PO TABS
500.0000 mg | ORAL_TABLET | Freq: Two times a day (BID) | ORAL | Status: DC
Start: 2016-08-03 — End: 2016-08-03

## 2016-08-03 MED ORDER — ALBUTEROL SULFATE HFA 108 (90 BASE) MCG/ACT IN AERS: 2.0000 | INHALATION_SPRAY | Freq: Four times a day (QID) | RESPIRATORY_TRACT | 3 refills | Status: AC | PRN

## 2016-08-03 NOTE — Progress Notes (Signed)
Pt is being discharged today, discharge instructions reviewed with the patient and his daughter who is his POA. They both verified understanding. 0 paper prescriptions were given to him. IV x1 removed, all belongings packed and returned to the patient. He was rolled out in a wheelchair by staff.

## 2016-08-03 NOTE — Discharge Summary (Signed)
[facility] [place] NAME: Kinsey Cowsert    MR#:  [id number]  DATE OF BIRTH:  February 09, 1946  DATE OF ADMISSION:  08/02/2016 ADMITTING PHYSICIAN: Fritzi Mandes, MD  DATE OF DISCHARGE: 08/03/16 PRIMARY CARE PHYSICIAN: Kathrine Haddock, NP    ADMISSION DIAGNOSIS:  lung cancer   DISCHARGE DIAGNOSIS:  Bilateral Pneumonia Stage III lung adenocarcinoma of the lung emphysema  SECONDARY DIAGNOSIS:   Past Medical History:  Diagnosis Date  . Allergy   . Cancer ([place]) 03/2016   lung  . Carotid artery bruit   . CINV (chemotherapy-induced nausea and vomiting)   . Emphysema lung ([place])   . Emphysema of lung ([place])   . Former smoker   . Heart murmur   . Hyperlipidemia   . Hypertension   . IFG (impaired fasting glucose)   . Osteoporosis   . Pulmonary stenosis     HOSPITAL COURSE:  Remy Voiles  is a 71 y.o. male with a known history of Stage III carcinoma of the lung was undergoing Chemoradiation 4 weeks ago is sent in from oncology office with tachypnea and recent CT done 2 days ago with increased infiltrates bilaterally suggestive of pneumonia.  1. Multifocal pneumonia in the setting of stage III carcinoma of the lung undergoing chemotherapy -IV Rocephin and Zithromax---oral ceftin and zithromax -Patient afebrile white count normal -Pulmonary consultation appreciated -Nebulizer when necessary, inhalers when necessary -sats 96% on RA  2. COPD/emphysema with mild exacerbation -IV Solu-Medrol, when necessary meds, inhalers---change to oral steroids  3. Hyponatremia, mild -received IV fluids  4. Hypertension -Resume home meds  5. DVT prophylaxis subcutaneous Lovenox  Overall stable D/c later today  CONSULTS OBTAINED:    DRUG ALLERGIES:  No Known Allergies  DISCHARGE MEDICATIONS:   Current Discharge Medication List    START taking these medications   Details  azithromycin (ZITHROMAX) 250 MG tablet Take 1 tablet (250 mg  total) by mouth daily. Qty: 4 tablet, Refills: 0    cefUROXime (CEFTIN) 500 MG tablet Take 1 tablet (500 mg total) by mouth 2 (two) times daily with a meal. Qty: 14 tablet, Refills: 0    feeding supplement, ENSURE ENLIVE, (ENSURE ENLIVE) LIQD Take 237 mLs by mouth 2 (two) times daily between meals. Qty: 237 mL, Refills: 12      CONTINUE these medications which have CHANGED   Details  albuterol (PROVENTIL HFA;VENTOLIN HFA) 108 (90 Base) MCG/ACT inhaler Inhale 2 puffs into the lungs every 6 (six) hours as needed for wheezing or shortness of breath. Qty: 1 Inhaler, Refills: 3    predniSONE (DELTASONE) 10 MG tablet Start 50 mg daily---taper by 10 mg daily then stop Qty: 15 tablet, Refills: 0      CONTINUE these medications which have NOT CHANGED   Details  acetaminophen (TYLENOL) 325 MG tablet Take 325-650 mg by mouth every 6 (six) hours as needed (for pain/headache.).     amLODipine (NORVASC) 10 MG tablet Take 1 tablet (10 mg total) by mouth daily. Qty: 90 tablet, Refills: 3    aspirin 81 MG tablet Take 81 mg by mouth daily.     ezetimibe (ZETIA) 10 MG tablet Take 1 tablet (10 mg total) by mouth daily. Qty: 30 tablet, Refills: 11    Fluticasone-Salmeterol (ADVAIR DISKUS) 500-50 MCG/DOSE AEPB Inhale 1 puff into the lungs 2 (two) times daily. Qty: 1 each, Refills: 3    gemfibrozil (LOPID) 600 MG tablet Take 600 mg by mouth at bedtime.  losartan (COZAAR) 100 MG tablet Take 1 tablet (100 mg total) by mouth daily. Qty: 30 tablet, Refills: 11        If you experience worsening of your admission symptoms, develop shortness of breath, life threatening emergency, suicidal or homicidal thoughts you must seek medical attention immediately by calling 911 or calling your MD immediately  if symptoms less severe.  You Must read complete instructions/literature along with all the possible adverse reactions/side effects for all the Medicines you take and that have been prescribed to you.  Take any new Medicines after you have completely understood and accept all the possible adverse reactions/side effects.   Please note  You were cared for by a hospitalist during your hospital stay. If you have any questions about your discharge medications or the care you received while you were in the hospital after you are discharged, you can call the unit and asked to speak with the hospitalist on call if the hospitalist that took care of you is not available. Once you are discharged, your primary care physician will handle any further medical issues. Please note that NO REFILLS for any discharge medications will be authorized once you are discharged, as it is imperative that you return to your primary care physician (or establish a relationship with a primary care physician if you do not have one) for your aftercare needs so that they can reassess your need for medications and monitor your lab values. Today   SUBJECTIVE   Feels better this am  VITAL SIGNS:  Blood pressure 114/71, pulse 70, temperature 97.9 F (36.6 C), temperature source Oral, resp. rate 20, height 6\' 1"  (1.854 m), weight 88.4 kg (194 lb 12.8 oz), SpO2 95 %.  I/O:    Intake/Output Summary (Last 24 hours) at 08/03/16 0825 Last data filed at 08/02/16 1843  Gross per 24 hour  Intake              340 ml  Output                0 ml  Net              340 ml    PHYSICAL EXAMINATION:  GENERAL:  71 y.o.-year-old patient lying in the bed with no acute distress.  EYES: Pupils equal, round, reactive to light and accommodation. No scleral icterus. Extraocular muscles intact.  HEENT: Head atraumatic, normocephalic. Oropharynx and nasopharynx clear.  NECK:  Supple, no jugular venous distention. No thyroid enlargement, no tenderness.  LUNGS: Normal breath sounds bilaterally, no wheezing, rales,rhonchi or crepitation. No use of accessory muscles of respiration.  CARDIOVASCULAR: S1, S2 normal. No murmurs, rubs, or gallops.   ABDOMEN: Soft, non-tender, non-distended. Bowel sounds present. No organomegaly or mass.  EXTREMITIES: No pedal edema, cyanosis, or clubbing.  NEUROLOGIC: Cranial nerves II through XII are intact. Muscle strength 5/5 in all extremities. Sensation intact. Gait not checked.  PSYCHIATRIC: The patient is alert and oriented x 3.  SKIN: No obvious rash, lesion, or ulcer.   DATA REVIEW:   CBC   Recent Labs Lab 08/02/16 0809  WBC 6.5  HGB 10.7*  HCT 30.5*  PLT 69*    Chemistries   Recent Labs Lab 08/02/16 0809  NA 130*  K 3.9  CL 99*  CO2 24  GLUCOSE 100*  BUN 29*  CREATININE 1.34*  CALCIUM 9.1  AST 19  ALT 12*  ALKPHOS 51  BILITOT 1.1    Microbiology Results   No results found for this  or any previous visit (from the past 240 hour(s)).  RADIOLOGY:  No results found.   Management plans discussed with the patient, family and they are in agreement.  CODE STATUS:     Code Status Orders        Start     Ordered   08/02/16 1004  Full code  Continuous     08/02/16 1003    Code Status History    Date Active Date Inactive Code Status Order ID Comments User Context   05/22/2016 10:02 AM 05/23/2016  1:48 PM Full Code [id number]  Schnier, Dolores Lory, MD Inpatient    Advance Directive Documentation     Most Recent Value  Type of Advance Directive  Healthcare Power of Attorney  Pre-existing out of facility DNR order (yellow form or pink MOST form)  -  "MOST" Form in Place?  -      TOTAL TIME TAKING CARE OF THIS PATIENT: *40* minutes.    Kassiah Mccrory M.D on 08/03/2016 at 8:25 AM  Between 7am to 6pm - Pager - [phone] After 6pm go to [email or web address] - password [email or web address] [facility]  Office  [phone]  CC: Primary care physician; Kathrine Haddock, NP

## 2016-08-07 ENCOUNTER — Ambulatory Visit: Payer: Medicare HMO | Admitting: Radiation Oncology

## 2016-08-08 ENCOUNTER — Other Ambulatory Visit: Payer: Self-pay | Admitting: Internal Medicine

## 2016-08-08 DIAGNOSIS — R69 Illness, unspecified: Secondary | ICD-10-CM | POA: Diagnosis not present

## 2016-08-08 MED ORDER — IPRATROPIUM-ALBUTEROL 0.5-2.5 (3) MG/3ML IN SOLN: 3.0000 mL | RESPIRATORY_TRACT | 3 refills | Status: AC | PRN

## 2016-08-08 NOTE — Progress Notes (Signed)
Spoke to patient's daughter; still having difficulty breathing. Recommend nebulizer/dual nebs. Patient has appointment with pulmonary on June 4; recommend checking pulse ox/need for oxygen then.  # Patient follow-up with Covering Provider in the week of June 11th/ and then decide if patient is ready to start immunotherapy. The patient will likely need CT scan/prior to starting immunotherapy- to make sure pneumonia is improving.   # pt's daughter is in agreement.

## 2016-08-09 DIAGNOSIS — J439 Emphysema, unspecified: Secondary | ICD-10-CM | POA: Diagnosis not present

## 2016-08-12 ENCOUNTER — Encounter: Payer: Self-pay | Admitting: Internal Medicine

## 2016-08-12 ENCOUNTER — Ambulatory Visit (INDEPENDENT_AMBULATORY_CARE_PROVIDER_SITE_OTHER): Payer: Medicare HMO | Admitting: Internal Medicine

## 2016-08-12 VITALS — BP 122/80 | HR 109 | Ht 73.0 in | Wt 195.0 lb

## 2016-08-12 DIAGNOSIS — J9611 Chronic respiratory failure with hypoxia: Secondary | ICD-10-CM

## 2016-08-12 DIAGNOSIS — J8410 Pulmonary fibrosis, unspecified: Secondary | ICD-10-CM

## 2016-08-12 DIAGNOSIS — R69 Illness, unspecified: Secondary | ICD-10-CM | POA: Diagnosis not present

## 2016-08-12 DIAGNOSIS — J441 Chronic obstructive pulmonary disease with (acute) exacerbation: Secondary | ICD-10-CM | POA: Diagnosis not present

## 2016-08-12 MED ORDER — BUDESONIDE 0.5 MG/2ML IN SUSP: 0.5000 mg | Freq: Two times a day (BID) | RESPIRATORY_TRACT | 3 refills | Status: AC

## 2016-08-12 MED ORDER — FORMOTEROL FUMARATE 20 MCG/2ML IN NEBU: 20.0000 ug | INHALATION_SOLUTION | Freq: Two times a day (BID) | RESPIRATORY_TRACT | 3 refills | Status: AC

## 2016-08-12 MED ORDER — AMOXICILLIN-POT CLAVULANATE 875-125 MG PO TABS: 1.0000 | ORAL_TABLET | Freq: Two times a day (BID) | ORAL | 0 refills | Status: AC

## 2016-08-12 NOTE — Progress Notes (Signed)
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 COPD Fibrosis  HPI:  +SOB +DOE Recent hospitalization for pneumonia Progressive worsening, now with severe hypoxia o2 sat dropped to 77% Feels very bad, fatigued, and tired Can not take inhalers as prescribed Needs oxygen   MEDICATIONS: I have reviewed all medications and confirmed regimen as documented    ROS: See HPI Other ROS negative   Vitals:   08/12/16 1051 08/12/16 1057 08/12/16 1101  BP:  122/80   Pulse:  (!) 111 (!) 109  SpO2:  (!) 77% 92%  Weight:  195 lb (88.5 kg)   Height: 6\' 1"  (1.854 m)       EXAM:  Gen: +respiratory distress HEENT: NCAT, sclera white, oropharynx normal Lungs: breath sounds full without adventitious sounds +crackles/rhonchi Cardiovascular: RRR, II/VI SEM Ext: without clubbing, cyanosis, edema Neuro: CNs grossly intact, motor and sensory intact Skin: Limited exam, no lesions noted    A/P 71 yo male with Stage 3 lung cancer with SOB/DOE with underlying COPD and fibrosis with recent hospitalization with  b/l pneumonia with hypoxic resp failure   Patient with left upper lobe adenocarcinoma, status post radiation and chemotherapy with presentation consistent with multilobar pneumonia. CT scan reveals centrilobular emphysema with component of fibrosis, radiation changes in the left lung zone with diffuse multi lobar parenchymal infiltrates.   1.DOE and SOB-multifactorial Needs oxygen 24/7  2.COPD Can not take inhalers will need to change to Neb therapy -pulmicort and peformist nebs -dounebs as needed  3.Fibrosis -post inflammatory -steroids not indicated this time  4.pneumonia -will treat with augmentin for another 10 days   Patient/Family are satisfied with Plan of action and management. All questions  answered Overall prognosis is poor and guarded.  Follow up in 3 months  Kaelin Holford Patricia Pesa, M.D.  [facility]  Medical Director [place] Director [facility] Cardio-Pulmonary Department

## 2016-08-12 NOTE — Patient Instructions (Addendum)
Change to Pulmicort Nebs 0.5mg  twice daily Change to performist nebs twice daily Dounebs every 4 hrs as needed Will need oxygen 24/7 augmentin 875 mg twice daily for 7 days

## 2016-08-13 ENCOUNTER — Telehealth: Payer: Self-pay | Admitting: Internal Medicine

## 2016-08-13 NOTE — Telephone Encounter (Signed)
Pt daughter returning our call  Please call back

## 2016-08-13 NOTE — Telephone Encounter (Signed)
LMOM for daughter Claiborne Billings to return call.

## 2016-08-13 NOTE — Telephone Encounter (Signed)
Pt daughter calling stating she would like a call back She has questions on patients Dx  She wants to know a little bit more about they should expect going forward  Please call back

## 2016-08-14 ENCOUNTER — Inpatient Hospital Stay
Admission: EM | Admit: 2016-08-14 | Discharge: 2016-09-08 | DRG: 871 | Disposition: E | Payer: Medicare HMO | Attending: Internal Medicine | Admitting: Internal Medicine

## 2016-08-14 ENCOUNTER — Other Ambulatory Visit: Payer: Self-pay

## 2016-08-14 ENCOUNTER — Emergency Department: Payer: Medicare HMO

## 2016-08-14 DIAGNOSIS — J439 Emphysema, unspecified: Secondary | ICD-10-CM | POA: Diagnosis not present

## 2016-08-14 DIAGNOSIS — M810 Age-related osteoporosis without current pathological fracture: Secondary | ICD-10-CM | POA: Diagnosis present

## 2016-08-14 DIAGNOSIS — J8410 Pulmonary fibrosis, unspecified: Secondary | ICD-10-CM | POA: Diagnosis present

## 2016-08-14 DIAGNOSIS — I959 Hypotension, unspecified: Secondary | ICD-10-CM | POA: Diagnosis present

## 2016-08-14 DIAGNOSIS — A419 Sepsis, unspecified organism: Principal | ICD-10-CM | POA: Diagnosis present

## 2016-08-14 DIAGNOSIS — D696 Thrombocytopenia, unspecified: Secondary | ICD-10-CM | POA: Diagnosis present

## 2016-08-14 DIAGNOSIS — Y95 Nosocomial condition: Secondary | ICD-10-CM | POA: Diagnosis present

## 2016-08-14 DIAGNOSIS — N182 Chronic kidney disease, stage 2 (mild): Secondary | ICD-10-CM | POA: Diagnosis present

## 2016-08-14 DIAGNOSIS — I129 Hypertensive chronic kidney disease with stage 1 through stage 4 chronic kidney disease, or unspecified chronic kidney disease: Secondary | ICD-10-CM | POA: Diagnosis present

## 2016-08-14 DIAGNOSIS — C3412 Malignant neoplasm of upper lobe, left bronchus or lung: Secondary | ICD-10-CM | POA: Diagnosis present

## 2016-08-14 DIAGNOSIS — J9690 Respiratory failure, unspecified, unspecified whether with hypoxia or hypercapnia: Secondary | ICD-10-CM

## 2016-08-14 DIAGNOSIS — R6520 Severe sepsis without septic shock: Secondary | ICD-10-CM | POA: Diagnosis present

## 2016-08-14 DIAGNOSIS — J9601 Acute respiratory failure with hypoxia: Secondary | ICD-10-CM | POA: Diagnosis not present

## 2016-08-14 DIAGNOSIS — N179 Acute kidney failure, unspecified: Secondary | ICD-10-CM | POA: Diagnosis present

## 2016-08-14 DIAGNOSIS — E785 Hyperlipidemia, unspecified: Secondary | ICD-10-CM | POA: Diagnosis present

## 2016-08-14 DIAGNOSIS — Z7189 Other specified counseling: Secondary | ICD-10-CM

## 2016-08-14 DIAGNOSIS — Z79899 Other long term (current) drug therapy: Secondary | ICD-10-CM

## 2016-08-14 DIAGNOSIS — R0602 Shortness of breath: Secondary | ICD-10-CM | POA: Diagnosis not present

## 2016-08-14 DIAGNOSIS — J189 Pneumonia, unspecified organism: Secondary | ICD-10-CM | POA: Diagnosis not present

## 2016-08-14 DIAGNOSIS — J441 Chronic obstructive pulmonary disease with (acute) exacerbation: Secondary | ICD-10-CM | POA: Diagnosis not present

## 2016-08-14 DIAGNOSIS — J449 Chronic obstructive pulmonary disease, unspecified: Secondary | ICD-10-CM | POA: Diagnosis not present

## 2016-08-14 DIAGNOSIS — R918 Other nonspecific abnormal finding of lung field: Secondary | ICD-10-CM | POA: Diagnosis not present

## 2016-08-14 DIAGNOSIS — Z8249 Family history of ischemic heart disease and other diseases of the circulatory system: Secondary | ICD-10-CM | POA: Diagnosis not present

## 2016-08-14 DIAGNOSIS — I248 Other forms of acute ischemic heart disease: Secondary | ICD-10-CM | POA: Diagnosis present

## 2016-08-14 DIAGNOSIS — Z923 Personal history of irradiation: Secondary | ICD-10-CM | POA: Diagnosis not present

## 2016-08-14 DIAGNOSIS — Z87891 Personal history of nicotine dependence: Secondary | ICD-10-CM | POA: Diagnosis not present

## 2016-08-14 DIAGNOSIS — Z7982 Long term (current) use of aspirin: Secondary | ICD-10-CM | POA: Diagnosis not present

## 2016-08-14 DIAGNOSIS — Z7951 Long term (current) use of inhaled steroids: Secondary | ICD-10-CM

## 2016-08-14 DIAGNOSIS — Z515 Encounter for palliative care: Secondary | ICD-10-CM | POA: Diagnosis not present

## 2016-08-14 DIAGNOSIS — Z9221 Personal history of antineoplastic chemotherapy: Secondary | ICD-10-CM

## 2016-08-14 DIAGNOSIS — J9621 Acute and chronic respiratory failure with hypoxia: Secondary | ICD-10-CM

## 2016-08-14 DIAGNOSIS — R69 Illness, unspecified: Secondary | ICD-10-CM | POA: Diagnosis not present

## 2016-08-14 DIAGNOSIS — C3490 Malignant neoplasm of unspecified part of unspecified bronchus or lung: Secondary | ICD-10-CM

## 2016-08-14 DIAGNOSIS — Z66 Do not resuscitate: Secondary | ICD-10-CM | POA: Diagnosis present

## 2016-08-14 DIAGNOSIS — Z9981 Dependence on supplemental oxygen: Secondary | ICD-10-CM

## 2016-08-14 DIAGNOSIS — R0603 Acute respiratory distress: Secondary | ICD-10-CM | POA: Diagnosis not present

## 2016-08-14 LAB — BASIC METABOLIC PANEL
ANION GAP: 13 (ref 5–15)
BUN: 22 mg/dL — ABNORMAL HIGH (ref 6–20)
CO2: 23 mmol/L (ref 22–32)
Calcium: 8.8 mg/dL — ABNORMAL LOW (ref 8.9–10.3)
Chloride: 99 mmol/L — ABNORMAL LOW (ref 101–111)
Creatinine, Ser: 1.45 mg/dL — ABNORMAL HIGH (ref 0.61–1.24)
GFR calc non Af Amer: 47 mL/min — ABNORMAL LOW (ref >60)
GFR, EST AFRICAN AMERICAN: 54 mL/min — AB (ref >60)
Glucose, Bld: 148 mg/dL — ABNORMAL HIGH (ref 65–99)
Potassium: 4.3 mmol/L (ref 3.5–5.1)
Sodium: 135 mmol/L (ref 135–145)

## 2016-08-14 LAB — BLOOD GAS, VENOUS
Acid-Base Excess: 0 mmol/L (ref 0.0–2.0)
Bicarbonate: 24.8 mmol/L (ref 20.0–28.0)
Delivery systems: POSITIVE
FIO2: 1
O2 SAT: 72.1 %
PATIENT TEMPERATURE: 37
PH VEN: 7.4 (ref 7.250–7.430)
pCO2, Ven: 40 mmHg — ABNORMAL LOW (ref 44.0–60.0)
pO2, Ven: 38 mmHg (ref 32.0–45.0)

## 2016-08-14 LAB — CBC WITH DIFFERENTIAL/PLATELET
BASOS ABS: 0.1 10*3/uL (ref 0–0.1)
BASOS PCT: 1 %
Eosinophils Absolute: 0.1 10*3/uL (ref 0–0.7)
Eosinophils Relative: 1 %
HEMATOCRIT: 29.4 % — AB (ref 40.0–52.0)
HEMOGLOBIN: 10 g/dL — AB (ref 13.0–18.0)
Lymphocytes Relative: 8 %
Lymphs Abs: 0.5 10*3/uL — ABNORMAL LOW (ref 1.0–3.6)
MCH: 32.1 pg (ref 26.0–34.0)
MCHC: 34 g/dL (ref 32.0–36.0)
MCV: 94.3 fL (ref 80.0–100.0)
Monocytes Absolute: 0.5 10*3/uL (ref 0.2–1.0)
Monocytes Relative: 7 %
NEUTROS ABS: 5.4 10*3/uL (ref 1.4–6.5)
NEUTROS PCT: 83 %
Platelets: 70 10*3/uL — ABNORMAL LOW (ref 150–440)
RBC: 3.11 MIL/uL — ABNORMAL LOW (ref 4.40–5.90)
RDW: 21.6 % — AB (ref 11.5–14.5)
WBC: 6.6 10*3/uL (ref 3.8–10.6)

## 2016-08-14 LAB — LACTIC ACID, PLASMA: LACTIC ACID, VENOUS: 2.5 mmol/L — AB (ref 0.5–1.9)

## 2016-08-14 LAB — TROPONIN I: TROPONIN I: 0.21 ng/mL — AB (ref <0.03)

## 2016-08-14 MED ORDER — CEFEPIME-DEXTROSE 1 GM/50ML IV SOLR
1.0000 g | Freq: Once | INTRAVENOUS | Status: AC
  Administered 2016-08-14: 1 g via INTRAVENOUS
  Filled 2016-08-14: qty 50

## 2016-08-14 MED ORDER — IPRATROPIUM-ALBUTEROL 0.5-2.5 (3) MG/3ML IN SOLN
3.0000 mL | Freq: Once | RESPIRATORY_TRACT | Status: AC
  Administered 2016-08-14: 3 mL via RESPIRATORY_TRACT

## 2016-08-14 MED ORDER — VANCOMYCIN HCL IN DEXTROSE 1-5 GM/200ML-% IV SOLN
1000.0000 mg | Freq: Once | INTRAVENOUS | Status: AC
  Administered 2016-08-14: 1000 mg via INTRAVENOUS
  Filled 2016-08-14: qty 200

## 2016-08-14 MED ORDER — ENOXAPARIN SODIUM 40 MG/0.4ML ~~LOC~~ SOLN
40.0000 mg | SUBCUTANEOUS | Status: DC
  Administered 2016-08-15 – 2016-08-16 (×2): 40 mg via SUBCUTANEOUS
  Filled 2016-08-14 (×2): qty 0.4

## 2016-08-14 MED ORDER — ALBUTEROL SULFATE (2.5 MG/3ML) 0.083% IN NEBU
2.5000 mg | INHALATION_SOLUTION | Freq: Four times a day (QID) | RESPIRATORY_TRACT | Status: DC | PRN
  Administered 2016-08-15: 2.5 mg via RESPIRATORY_TRACT
  Filled 2016-08-14: qty 3

## 2016-08-14 MED ORDER — IPRATROPIUM BROMIDE 0.02 % IN SOLN: 0.5000 mg | Freq: Four times a day (QID) | RESPIRATORY_TRACT | Status: DC | PRN

## 2016-08-14 MED ORDER — BISACODYL 5 MG PO TBEC: 5.0000 mg | DELAYED_RELEASE_TABLET | Freq: Every day | ORAL | Status: DC | PRN

## 2016-08-14 MED ORDER — EZETIMIBE 10 MG PO TABS
10.0000 mg | ORAL_TABLET | Freq: Every day | ORAL | Status: DC
  Administered 2016-08-15: 10 mg via ORAL
  Filled 2016-08-14 (×2): qty 1

## 2016-08-14 MED ORDER — GEMFIBROZIL 600 MG PO TABS: 600.0000 mg | ORAL_TABLET | Freq: Every day | ORAL | Status: DC

## 2016-08-14 MED ORDER — VANCOMYCIN HCL IN DEXTROSE 1-5 GM/200ML-% IV SOLN: 1000.0000 mg | Freq: Once | INTRAVENOUS | Status: DC

## 2016-08-14 MED ORDER — OXYCODONE HCL 5 MG PO TABS: 5.0000 mg | ORAL_TABLET | ORAL | Status: DC | PRN

## 2016-08-14 MED ORDER — DEXTROSE 5 % IV SOLN: 2.0000 g | Freq: Once | INTRAVENOUS | Status: DC

## 2016-08-14 MED ORDER — SODIUM CHLORIDE 0.9 % IV BOLUS (SEPSIS)
1000.0000 mL | Freq: Once | INTRAVENOUS | Status: AC
  Administered 2016-08-14: 1000 mL via INTRAVENOUS

## 2016-08-14 MED ORDER — INSULIN ASPART 100 UNIT/ML ~~LOC~~ SOLN
0.0000 [IU] | SUBCUTANEOUS | Status: DC
  Administered 2016-08-15 (×3): 2 [IU] via SUBCUTANEOUS
  Administered 2016-08-15 (×3): 3 [IU] via SUBCUTANEOUS
  Administered 2016-08-16: 2 [IU] via SUBCUTANEOUS
  Administered 2016-08-16: 3 [IU] via SUBCUTANEOUS
  Filled 2016-08-14: qty 2
  Filled 2016-08-14 (×4): qty 3
  Filled 2016-08-14 (×3): qty 2

## 2016-08-14 MED ORDER — IPRATROPIUM-ALBUTEROL 0.5-2.5 (3) MG/3ML IN SOLN
RESPIRATORY_TRACT | Status: AC
  Filled 2016-08-14: qty 9

## 2016-08-14 MED ORDER — ONDANSETRON HCL 4 MG/2ML IJ SOLN: 4.0000 mg | Freq: Four times a day (QID) | INTRAMUSCULAR | Status: DC | PRN

## 2016-08-14 MED ORDER — ASPIRIN EC 81 MG PO TBEC
81.0000 mg | DELAYED_RELEASE_TABLET | Freq: Every day | ORAL | Status: DC
  Administered 2016-08-15: 81 mg via ORAL
  Filled 2016-08-14 (×3): qty 1

## 2016-08-14 MED ORDER — ACETAMINOPHEN 325 MG PO TABS: 650.0000 mg | ORAL_TABLET | Freq: Four times a day (QID) | ORAL | Status: DC | PRN

## 2016-08-14 MED ORDER — ONDANSETRON HCL 4 MG PO TABS
8.0000 mg | ORAL_TABLET | Freq: Two times a day (BID) | ORAL | Status: DC | PRN
Start: 2016-08-14 — End: 2016-08-15

## 2016-08-14 MED ORDER — BUDESONIDE 0.5 MG/2ML IN SUSP
0.5000 mg | Freq: Two times a day (BID) | RESPIRATORY_TRACT | Status: DC
  Administered 2016-08-15: 0.5 mg via RESPIRATORY_TRACT
  Filled 2016-08-14 (×2): qty 2

## 2016-08-14 MED ORDER — SENNOSIDES-DOCUSATE SODIUM 8.6-50 MG PO TABS: 1.0000 | ORAL_TABLET | Freq: Every evening | ORAL | Status: DC | PRN

## 2016-08-14 MED ORDER — MAGNESIUM CITRATE PO SOLN
1.0000 | Freq: Once | ORAL | Status: DC | PRN
  Filled 2016-08-14: qty 296

## 2016-08-14 MED ORDER — ONDANSETRON HCL 4 MG PO TABS: 4.0000 mg | ORAL_TABLET | Freq: Four times a day (QID) | ORAL | Status: DC | PRN

## 2016-08-14 MED ORDER — ACETAMINOPHEN 650 MG RE SUPP: 650.0000 mg | Freq: Four times a day (QID) | RECTAL | Status: DC | PRN

## 2016-08-14 MED ORDER — SODIUM CHLORIDE 0.9 % IV SOLN
INTRAVENOUS | Status: DC
  Administered 2016-08-15 – 2016-08-16 (×4): via INTRAVENOUS

## 2016-08-14 NOTE — ED Notes (Signed)
Pt on bipap per RT at this time.

## 2016-08-14 NOTE — ED Notes (Signed)
Pharmacy at bedside

## 2016-08-14 NOTE — ED Provider Notes (Signed)
[facility] Emergency Department Provider Note  ____________________________________________  Time seen: Approximately 9:27 PM  I have reviewed the triage vital signs and the nursing notes.   HISTORY  Chief Complaint Respiratory Distress   HPI Marcus Cortez is a 71 y.o. male with recently diagnosed lung canceron chemotherapy, hypertension, hyperlipidemia, COPD on 2 L nasal cannula who presents in respiratory distress. According to patient's daughters he has had progressively worsening shortness of breath for the last 3-4 days. He has a cough productive of yellow sputum. He is been on Augmentin that was given by his pulmonologist for the last 3 days. No fever or chills. No chest pain. The daughters said that he wouldn't allow them to call EMS but this evening he requested a EMS because due to severe shortness of breath. Patient denies chest pain, abdominal pain, nausea vomiting, diarrhea. Patient is complaining of severe shortness of breath that has been constant. When EMS arrived the patient was satting in the 60s on room air and he was placed on nonrebreather which improved his sats to the 80s. He received 2 DuoNeb treatments and 125 mg of Solu-Medrol in route. Patient is DNR/DNI which was confirmed with his 3 daughters.   Past Medical History:  Diagnosis Date  . Allergy   . Cancer ([place]) 03/2016   lung  . Carotid artery bruit   . CINV (chemotherapy-induced nausea and vomiting)   . Emphysema lung ([place])   . Emphysema of lung ([place])   . Former smoker   . Heart murmur   . Hyperlipidemia   . Hypertension   . IFG (impaired fasting glucose)   . Osteoporosis   . Pulmonary stenosis     Patient Active Problem List   Diagnosis Date Noted  . Pneumonia 08/02/2016  . Carbuncle of scrotum 07/25/2016  . Cough with fever 06/12/2016  . Carotid stenosis, symptomatic w/o infarct, right 05/22/2016  . Carotid stenosis 05/09/2016  . Primary cancer of left upper lobe of  lung ([place]) 04/24/2016  . Persistent headaches 04/24/2016  . Mass of upper lobe of left lung 04/12/2016  . Hypertension 04/08/2016  . Nocturia 01/31/2016  . Nasal polyp 01/31/2016  . Allergic rhinitis 01/20/2015  . Osteoarthritis 01/20/2015  . IFG (impaired fasting glucose) 01/20/2015  . Hyperlipidemia 01/20/2015  . Hypertriglyceridemia 01/20/2015    Past Surgical History:  Procedure Laterality Date  . CAROTID PTA/STENT INTERVENTION Right 05/22/2016   Procedure: Carotid PTA/Stent Intervention;  Surgeon: Katha Cabal, MD;  Location: [facility];  Service: Cardiovascular;  Laterality: Right;  . EYE SURGERY  2010   cataract  . FLEXIBLE BRONCHOSCOPY N/A 04/10/2016   Procedure: FLEXIBLE BRONCHOSCOPY;  Surgeon: Wilhelmina Mcardle, MD;  Location: [facility] ORS;  Service: Pulmonary;  Laterality: N/A;    Prior to Admission medications   Medication Sig Start Date End Date Taking? Authorizing Provider  acetaminophen (TYLENOL) 325 MG tablet Take 325-650 mg by mouth every 6 (six) hours as needed (for pain/headache.).     [provider]  albuterol (PROVENTIL HFA;VENTOLIN HFA) 108 (90 Base) MCG/ACT inhaler Inhale 2 puffs into the lungs every 6 (six) hours as needed for wheezing or shortness of breath. 08/03/16   Fritzi Mandes, MD  amLODipine (NORVASC) 10 MG tablet Take 1 tablet (10 mg total) by mouth daily. 05/13/16   Minna Merritts, MD  amoxicillin-clavulanate (AUGMENTIN) 875-125 MG tablet Take 1 tablet by mouth 2 (two) times daily. 08/12/16 08/22/16  Flora Lipps, MD  aspirin 81 MG tablet  Take 81 mg by mouth daily.     [provider]  budesonide (PULMICORT) 0.5 MG/2ML nebulizer solution Take 2 mLs (0.5 mg total) by nebulization 2 (two) times daily. 08/12/16   Flora Lipps, MD  ezetimibe (ZETIA) 10 MG tablet Take 1 tablet (10 mg total) by mouth daily. 05/13/16 05/13/17  Minna Merritts, MD  feeding supplement, ENSURE ENLIVE, (ENSURE ENLIVE) LIQD Take 237 mLs by mouth 2 (two) times  daily between meals. 08/03/16   Fritzi Mandes, MD  Fluticasone-Salmeterol (ADVAIR DISKUS) 500-50 MCG/DOSE AEPB Inhale 1 puff into the lungs 2 (two) times daily. 07/19/16   Cammie Sickle, MD  formoterol (PERFOROMIST) 20 MCG/2ML nebulizer solution Take 2 mLs (20 mcg total) by nebulization 2 (two) times daily. Patient not taking: Reported on 08/29/2016 08/12/16   Flora Lipps, MD  gemfibrozil (LOPID) 600 MG tablet Take 600 mg by mouth at bedtime.     [provider]  ipratropium-albuterol (DUONEB) 0.5-2.5 (3) MG/3ML SOLN Take 3 mLs by nebulization every 4 (four) hours as needed. 08/08/16   Cammie Sickle, MD  losartan (COZAAR) 100 MG tablet Take 1 tablet (100 mg total) by mouth daily. Patient taking differently: Take 50 mg by mouth daily. BASED ON BLOOD PRESSURE READINGS 05/13/16 05/13/17  Minna Merritts, MD  ondansetron (ZOFRAN) 8 MG tablet Take 8 mg by mouth 3 times/day as needed-between meals & bedtime. 05/30/16   [provider]    Allergies Patient has no known allergies.  Family History  Problem Relation Age of Onset  . Hypertension Mother   . Thyroid disease Mother   . Glaucoma Mother   . Alzheimer's disease Father   . Stroke Maternal Grandmother   . Cancer Maternal Grandfather   . Alzheimer's disease Paternal Grandmother   . Stroke Paternal Grandfather     Social History Social History  Substance Use Topics  . Smoking status: Former Smoker    Packs/day: 0.50    Years: 35.00    Types: Cigarettes    Quit date: 01/10/2015  . Smokeless tobacco: Former Systems developer    Types: Chew    Quit date: 04/10/1978  . Alcohol use No    Review of Systems  Constitutional: Negative for fever. Eyes: Negative for visual changes. ENT: Negative for sore throat. Neck: No neck pain  Cardiovascular: Negative for chest pain. Respiratory: + shortness of breath and cough Gastrointestinal: Negative for abdominal pain, vomiting or diarrhea. Genitourinary: Negative for  dysuria. Musculoskeletal: Negative for back pain. Skin: Negative for rash. Neurological: Negative for headaches, weakness or numbness. Psych: No SI or HI  ____________________________________________   PHYSICAL EXAM:  VITAL SIGNS: ED Triage Vitals  Enc Vitals Group     BP 09/06/2016 2100 94/78     Pulse Rate 08/15/2016 2100 (!) 109     Resp 08/15/2016 2100 (!) 27     Temp 08/30/2016 2124 99.4 F (37.4 C)     Temp Source 08/18/2016 2124 Axillary     SpO2 08/23/2016 2055 (!) 63 %     Weight 08/09/2016 2125 195 lb (88.5 kg)     Height 08/19/2016 2125 _0  (1.854 m)     Head Circumference --      Peak Flow --      Pain Score 08/15/2016 2124 0     Pain Loc --      Pain Edu? --      Excl. in [place]? --     Constitutional: Alert and oriented, severe respiratory distress.  HEENT:  Head: Normocephalic and atraumatic.         Eyes: Conjunctivae are normal. Sclera is non-icteric.       Mouth/Throat: Mucous membranes are moist.       Neck: Supple with no signs of meningismus. Cardiovascular: Tachycardic with regular rhythm. No murmurs, gallops, or rubs. 2+ symmetrical distal pulses are present in all extremities. No JVD. Respiratory: Increased work of breathing, retractions, unable to speak in full sentences, diffuse crackles bilaterally Gastrointestinal: Soft, non tender, and non distended with positive bowel sounds. No rebound or guarding. Musculoskeletal: Nontender with normal range of motion in all extremities. No edema, cyanosis, or erythema of extremities. Neurologic: Normal speech and language. Face is symmetric. Moving all extremities. No gross focal neurologic deficits are appreciated. Skin: Skin is warm, dry and intact. No rash noted. Psychiatric: Mood and affect are normal. Speech and behavior are normal.  ____________________________________________   LABS (all labs ordered are listed, but only abnormal results are displayed)  Labs Reviewed  CBC WITH DIFFERENTIAL/PLATELET - Abnormal;  Notable for the following:       Result Value   RBC 3.11 (*)    Hemoglobin 10.0 (*)    HCT 29.4 (*)    RDW 21.6 (*)    Platelets 70 (*)    Lymphs Abs 0.5 (*)    All other components within normal limits  BASIC METABOLIC PANEL - Abnormal; Notable for the following:    Chloride 99 (*)    Glucose, Bld 148 (*)    BUN 22 (*)    Creatinine, Ser 1.45 (*)    Calcium 8.8 (*)    GFR calc non Af Amer 47 (*)    GFR calc Af Amer 54 (*)    All other components within normal limits  LACTIC ACID, PLASMA - Abnormal; Notable for the following:    Lactic Acid, Venous 2.5 (*)    All other components within normal limits  BLOOD GAS, VENOUS - Abnormal; Notable for the following:    pCO2, Ven 40 (*)    All other components within normal limits  TROPONIN I - Abnormal; Notable for the following:    Troponin I 0.21 (*)    All other components within normal limits  CULTURE, BLOOD (ROUTINE X 2)  CULTURE, BLOOD (ROUTINE X 2)   ____________________________________________  EKG  ED ECG REPORT I, Rudene Re, the attending physician, personally viewed and interpreted this ECG.  Sinus tachycardia, rate of 105, normal PR and QRS intervals, prolonged QTC, normal axis, no ST elevations or depressions. ____________________________________________  RADIOLOGY  CXR:  Left perihilar and basilar opacity is noted concerning for pneumonia. Increased right upper lobe opacity is noted also concerning for pneumonia. ____________________________________________   PROCEDURES  Procedure(s) performed: None Procedures Critical Care performed: yes  CRITICAL CARE Performed by: Rudene Re  ?  Total critical care time: 40 min  Critical care time was exclusive of separately billable procedures and treating other patients.  Critical care was necessary to treat or prevent imminent or life-threatening deterioration.  Critical care was time spent personally by me on the following activities:  development of treatment plan with patient and/or surrogate as well as nursing, discussions with consultants, evaluation of patient's response to treatment, examination of patient, obtaining history from patient or surrogate, ordering and performing treatments and interventions, ordering and review of laboratory studies, ordering and review of radiographic studies, pulse oximetry and re-evaluation of patient's condition.  ____________________________________________   INITIAL IMPRESSION / ASSESSMENT AND PLAN / ED COURSE  71 y.o. male with recently diagnosed lung canceron chemotherapy, hypertension, hyperlipidemia, COPD on 2 L nasal cannula who presents in respiratory distress. Patient in severe respiratory distress, hypoxic, tachycardic, low-grade temperature of 99.43F and diffuse crackles in bilateral lung fields. Chest x-ray concerning for pneumonia. Patient was placed on BiPAP by EMS which was continued in the emergency room. His CODE STATUS was confirmed with his 3 daughters to be DNR/DNI. Patient received Solu-Medrol per EMS. Received IV cefepime and vancomycin in the emergency room. He received IV fluids for hypotension. Troponin elevated at 0.21 concerning for demand ischemia. Lactate elevated at 2.5. Patient's can be admitted to stepdown unit.     Pertinent labs & imaging results that were available during my care of the patient were reviewed by me and considered in my medical decision making (see chart for details).    ____________________________________________   FINAL CLINICAL IMPRESSION(S) / ED DIAGNOSES  Final diagnoses:  Sepsis, due to unspecified organism ([place])  Acute respiratory failure with hypoxia ([place])  Community acquired pneumonia, unspecified laterality      NEW MEDICATIONS STARTED DURING THIS VISIT:  New Prescriptions   No medications on file     Note:  This document was prepared using Dragon voice recognition software and may include unintentional dictation  errors.    Rudene Re, MD 08/25/2016 2233

## 2016-08-14 NOTE — ED Notes (Signed)
Blood specimens sent to lab at this time with save labels. D/t emergency

## 2016-08-14 NOTE — H&P (Signed)
History and Physical   [facility] - [place] @ [facility] [address] Admission History and Physical [employer], D.O.    Patient Name: Katsumi Wisler MR#: [id number] Date of Birth: May 29, 1945 Date of Admission: 08/13/2016  Referring MD/NP/PA: Dr. Alfred Levins Primary Care Physician: Kathrine Haddock, NP Patient coming from: Home Outpatient Specialists: Charlsie Merles   Chief Complaint:  Chief Complaint  Patient presents with  . Respiratory Distress    HPI: Durelle Zepeda is a 71 y.o. male with a known history of Stage III Lung ca, COPD on 2L [place], HTN, HLD, pulmonary fibrosis presents to the emergency department for evaluation of shortness of breath.  Patient was in a usual state of health until Last week he was admitted to the hospital for community-acquired pneumonia. He was discharged home on Augmentin however states that he never fully recovered. He has had progressively worsening shortness of breath over the last 3 or 4 days associated with a productive cough. He did see Dr. Stoney Bang recently and was started on 2 L of O2 via nasal cannula. EMS found him with an O2 sat in the 60s. In the emergency department he was placed on BiPAP, received DuoNeb treatments, Solu-Medrol, cefepime and Vanco.  Patient denies fevers/chills, weakness, dizziness, chest pain, N/V/C/D, abdominal pain, dysuria/frequency, changes in mental status.   Review of Systems:  CONSTITUTIONAL: No fever/chills, fatigue, weakness, weight gain/loss, headache. EYES: No blurry or double vision. ENT: No tinnitus, postnasal drip, redness or soreness of the oropharynx. RESPIRATORY: Positive cough, dyspnea, wheeze.  No hemoptysis.  CARDIOVASCULAR: No chest pain, palpitations, syncope, orthopnea. No lower extremity edema.  GASTROINTESTINAL: No nausea, vomiting, abdominal pain, diarrhea, constipation.  No hematemesis, melena or hematochezia. GENITOURINARY: No dysuria, frequency, hematuria. ENDOCRINE: No polyuria or nocturia. No heat or  cold intolerance. HEMATOLOGY: No anemia, bruising, bleeding. INTEGUMENTARY: No rashes, ulcers, lesions. MUSCULOSKELETAL: No arthritis, gout, dyspnea. NEUROLOGIC: No numbness, tingling, ataxia, seizure-type activity, weakness. PSYCHIATRIC: No anxiety, depression, insomnia.   Past Medical History:  Diagnosis Date  . Allergy   . Cancer ([place]) 03/2016   lung  . Carotid artery bruit   . CINV (chemotherapy-induced nausea and vomiting)   . Emphysema lung ([place])   . Emphysema of lung ([place])   . Former smoker   . Heart murmur   . Hyperlipidemia   . Hypertension   . IFG (impaired fasting glucose)   . Osteoporosis   . Pulmonary stenosis     Past Surgical History:  Procedure Laterality Date  . CAROTID PTA/STENT INTERVENTION Right 05/22/2016   Procedure: Carotid PTA/Stent Intervention;  Surgeon: Katha Cabal, MD;  Location: [facility];  Service: Cardiovascular;  Laterality: Right;  . EYE SURGERY  2010   cataract  . FLEXIBLE BRONCHOSCOPY N/A 04/10/2016   Procedure: FLEXIBLE BRONCHOSCOPY;  Surgeon: Wilhelmina Mcardle, MD;  Location: [facility];  Service: Pulmonary;  Laterality: N/A;     reports that he quit smoking about 19 months ago. His smoking use included Cigarettes. He has a 17.50 pack-year smoking history. He quit smokeless tobacco use about 38 years ago. His smokeless tobacco use included Chew. He reports that he does not drink alcohol or use drugs.  No Known Allergies  Family History  Problem Relation Age of Onset  . Hypertension Mother   . Thyroid disease Mother   . Glaucoma Mother   . Alzheimer's disease Father   . Stroke Maternal Grandmother   . Cancer Maternal Grandfather   . Alzheimer's disease Paternal Grandmother   . Stroke Paternal Grandfather  Prior to Admission medications   Medication Sig Start Date End Date Taking? Authorizing Provider  acetaminophen (TYLENOL) 325 MG tablet Take 325-650 mg by mouth every 6 (six) hours as needed (for pain/headache.).     Yes [provider]  albuterol (PROVENTIL HFA;VENTOLIN HFA) 108 (90 Base) MCG/ACT inhaler Inhale 2 puffs into the lungs every 6 (six) hours as needed for wheezing or shortness of breath. 08/03/16  Yes Fritzi Mandes, MD  amLODipine (NORVASC) 10 MG tablet Take 1 tablet (10 mg total) by mouth daily. 05/13/16  Yes Minna Merritts, MD  amoxicillin-clavulanate (AUGMENTIN) 875-125 MG tablet Take 1 tablet by mouth 2 (two) times daily. 08/12/16 08/22/16 Yes Flora Lipps, MD  aspirin 81 MG tablet Take 81 mg by mouth daily.    Yes [provider]  budesonide (PULMICORT) 0.5 MG/2ML nebulizer solution Take 2 mLs (0.5 mg total) by nebulization 2 (two) times daily. 08/12/16  Yes Flora Lipps, MD  ezetimibe (ZETIA) 10 MG tablet Take 1 tablet (10 mg total) by mouth daily. 05/13/16 05/13/17 Yes Gollan, Kathlene November, MD  feeding supplement, ENSURE ENLIVE, (ENSURE ENLIVE) LIQD Take 237 mLs by mouth 2 (two) times daily between meals. 08/03/16  Yes Fritzi Mandes, MD  gemfibrozil (LOPID) 600 MG tablet Take 600 mg by mouth at bedtime.    Yes [provider]  ipratropium-albuterol (DUONEB) 0.5-2.5 (3) MG/3ML SOLN Take 3 mLs by nebulization every 4 (four) hours as needed. Patient taking differently: Take 3 mLs by nebulization every 4 (four) hours as needed (shortness of breath).  08/08/16  Yes Cammie Sickle, MD  losartan (COZAAR) 100 MG tablet Take 1 tablet (100 mg total) by mouth daily. Patient taking differently: Take 50 mg by mouth daily. BASED ON BLOOD PRESSURE READINGS 05/13/16 05/13/17 Yes Gollan, Kathlene November, MD  ondansetron (ZOFRAN) 8 MG tablet Take 8 mg by mouth 3 times/day as needed-between meals & bedtime. 05/30/16  Yes [provider]  formoterol (PERFOROMIST) 20 MCG/2ML nebulizer solution Take 2 mLs (20 mcg total) by nebulization 2 (two) times daily. Patient not taking: Reported on 09/07/2016 08/12/16   Flora Lipps, MD    Physical Exam: Vitals:   08/10/2016 2220 09/02/2016 2230 08/15/2016 2300  08/25/2016 2310  BP: 103/72 104/75 93/70 101/76  Pulse: (!) 104 100 93 97  Resp: (!) 32 (!) 33 (!) 29 (!) 34  Temp:      TempSrc:      SpO2: 100% 99% 97% 96%  Weight:      Height:        GENERAL: 71 y.o.-year-old White male patient, well-developed, well-nourished lying in the bed in no acute distress.  Pleasant and cooperative.   HEENT: Head atraumatic, normocephalic. Pupils equal, round, reactive to light and accommodation. No scleral icterus.  Mucus membranes moist. NECK: Supple, full range of motion. CHEST: Bibasilar crackles. BiPAP in place. No use of accessory muscles of respiration.  No reproducible chest wall tenderness.  CARDIOVASCULAR: S1, S2 normal. No murmurs, rubs, or gallops. Cap refill <2 seconds. Pulses intact distally.  ABDOMEN: Soft, nondistended, nontender. No rebound, guarding, rigidity. Normoactive bowel sounds present in all four quadrants. No organomegaly or mass. EXTREMITIES: No pedal edema, cyanosis, or clubbing. No calf tenderness or Homan's sign.  NEUROLOGIC: The patient is alert and oriented x 3. Cranial nerves II through XII are grossly intact with no focal sensorimotor deficit. Muscle strength 5/5 in all extremities. Sensation intact. Gait not checked. PSYCHIATRIC:  Normal affect, mood, thought content. SKIN: Warm, dry, and intact without  obvious rash, lesion, or ulcer.    Labs on Admission:  CBC:  Recent Labs Lab 09/04/2016 2104  WBC 6.6  NEUTROABS 5.4  HGB 10.0*  HCT 29.4*  MCV 94.3  PLT 70*   Basic Metabolic Panel:  Recent Labs Lab 09/02/2016 2104  NA 135  K 4.3  CL 99*  CO2 23  GLUCOSE 148*  BUN 22*  CREATININE 1.45*  CALCIUM 8.8*   GFR: Estimated Creatinine Clearance: 52 mL/min (A) (by C-G formula based on SCr of 1.45 mg/dL (H)). Liver Function Tests: No results for input(s): AST, ALT, ALKPHOS, BILITOT, PROT, ALBUMIN in the last 168 hours. No results for input(s): LIPASE, AMYLASE in the last 168 hours. No results for input(s):  AMMONIA in the last 168 hours. Coagulation Profile: No results for input(s): INR, PROTIME in the last 168 hours. Cardiac Enzymes:  Recent Labs Lab 08/10/2016 2104  TROPONINI 0.21*   BNP (last 3 results) No results for input(s): PROBNP in the last 8760 hours. HbA1C: No results for input(s): HGBA1C in the last 72 hours. CBG: No results for input(s): GLUCAP in the last 168 hours. Lipid Profile: No results for input(s): CHOL, HDL, LDLCALC, TRIG, CHOLHDL, LDLDIRECT in the last 72 hours. Thyroid Function Tests: No results for input(s): TSH, T4TOTAL, FREET4, T3FREE, THYROIDAB in the last 72 hours. Anemia Panel: No results for input(s): VITAMINB12, FOLATE, FERRITIN, TIBC, IRON, RETICCTPCT in the last 72 hours. Urine analysis:    Component Value Date/Time   COLORURINE YELLOW (A) 06/12/2016 1025   APPEARANCEUR HAZY (A) 06/12/2016 1025   LABSPEC 1.025 06/12/2016 1025   PHURINE 5.0 06/12/2016 1025   GLUCOSEU 150 (A) 06/12/2016 1025   HGBUR NEGATIVE 06/12/2016 1025   [place] 06/12/2016 1025   KETONESUR NEGATIVE 06/12/2016 1025   PROTEINUR 30 (A) 06/12/2016 1025   NITRITE NEGATIVE 06/12/2016 1025   LEUKOCYTESUR NEGATIVE 06/12/2016 1025   Sepsis Labs: _0 (procalcitonin:4,lacticidven:4) )No results found for this or any previous visit (from the past 240 hour(s)).   Radiological Exams on Admission: Dg Chest Portable 1 View  Result Date: 08/10/2016 CLINICAL DATA:  Shortness of breath. EXAM: PORTABLE CHEST 1 VIEW COMPARISON:  Radiographs of June 12, 2016.  CT scan of Jul 31, 2016. FINDINGS: Stable cardiomegaly. No pneumothorax is noted. Increased left perihilar and basilar opacity is noted concerning for pneumonia. Increased right upper lobe opacity is noted concerning for pneumonia. Mild diffuse interstitial densities are noted concerning for edema or inflammation. Bony thorax is unremarkable. IMPRESSION: Left perihilar and basilar opacity is noted concerning for pneumonia.  Increased right upper lobe opacity is noted also concerning for pneumonia. Electronically Signed   By: Marijo Conception, M.D.   On: 09/05/2016 21:27    EKG: Sinus tachycardia at 10 5 bpm with normal axis, prolonged QT and nonspecific ST-T wave changes.   Assessment/Plan  This is a 71 y.o. male with a history of Stage III Lung ca, COPD on 2L [place], HTN, HLD, pulmonary fibrosis  now being admitted with:  #. Sepsis secondary to HCAP - Admit to inpatient, stepdown with telemetry monitoring - IV antibiotics: Cefepime, Vanco - IV fluid hydration - Follow up blood,urine & sputum cultures - Repeat CBC in am.  - Infectious disease consultation has been requested  #. Acute respiratory failure 2/2 above, in setting of COPD, chronic respiratory failure - BiPAP as per CCM - Continue Solumedrol -Continue Pulmicort  #. Elevated troponin, likely demand ischemia - Trend trops - Monitor on telemetry  #. History of CKD, stable,  baseline - Continue to monitor BMP  #. History of HTN - Hold Norvasc, Cozaar secondary to hypotension  #. History of hyperlipidemia - Continue Zetia, Lopid  Admission status: Inpatient, stepdown IV Fluids: NS Diet/Nutrition: NPO Consults called: CCM, palliative care to discuss advanced are active  DVT Px: Lovenox, SCDs and early ambulation. Code Status: Full Code - discussed with patient and his 2 daughters Disposition Plan: To home in 2-3 days  All the records are reviewed and case discussed with ED provider. Management plans discussed with the patient and/or family who express understanding and agree with plan of care.  Emya Picado D.O. on 08/23/2016 at 11:28 PM Between 7am to 6pm - Pager - [phone] After 6pm go to [email or web address] - [employer] [facility] [place] [facility] Office [phone] CC: Primary care physician; Kathrine Haddock, NP   08/13/2016, 11:28 PM

## 2016-08-14 NOTE — Telephone Encounter (Signed)
Spoke with daughter Davy Pique and she states that they only picked up Budesonide at the pharmacy. I called the pharmacy in regards to the [place] and pharmacy states it is $595 and that they don't have a Part B Medicare on file. I called Sonya back and informed her that the pharmacy needs the Part B card to run the Perforomist and also informed her that after it is ran through Part B and they don't get it that our office needs to know.   Daughter also stated that the pt has been wearing the O2 @ 2L that was ordered when he was in office being seen on 08/12/16. States pt is still very SOB and states pt's nail beds is blue. I informed daughter that we hjave no provider in clinic today to either take pt to PCP or ER. She ask if they can turn up the O2 higher than 2L. Informed her I would have to send message to on call provider. Please advise further.

## 2016-08-14 NOTE — ED Notes (Signed)
Admitting MD at bedside at this time.

## 2016-08-14 NOTE — ED Notes (Signed)
XR at bedside

## 2016-08-14 NOTE — ED Triage Notes (Signed)
Pt to ED via [facility] d/t RR distress at home. Pt RA sat 63%, 80% on cpap in route. PT stage III Lung CA, LUL Mass. Last chemo and radiation 4 weeks ago. Pt recent admission PNA-home PO antibiotics started on Monday. Pt CBG 255 in route. 125 solumedrol, 2 albuterol, 2 duoneb in route. Rhonchi heard rt lobes.

## 2016-08-15 ENCOUNTER — Encounter: Payer: Self-pay | Admitting: *Deleted

## 2016-08-15 DIAGNOSIS — C3490 Malignant neoplasm of unspecified part of unspecified bronchus or lung: Secondary | ICD-10-CM

## 2016-08-15 DIAGNOSIS — J9621 Acute and chronic respiratory failure with hypoxia: Secondary | ICD-10-CM

## 2016-08-15 DIAGNOSIS — R918 Other nonspecific abnormal finding of lung field: Secondary | ICD-10-CM

## 2016-08-15 DIAGNOSIS — Z7189 Other specified counseling: Secondary | ICD-10-CM

## 2016-08-15 DIAGNOSIS — C3412 Malignant neoplasm of upper lobe, left bronchus or lung: Secondary | ICD-10-CM

## 2016-08-15 DIAGNOSIS — Z515 Encounter for palliative care: Secondary | ICD-10-CM

## 2016-08-15 DIAGNOSIS — J9601 Acute respiratory failure with hypoxia: Secondary | ICD-10-CM

## 2016-08-15 LAB — GLUCOSE, CAPILLARY
GLUCOSE-CAPILLARY: 134 mg/dL — AB (ref 65–99)
GLUCOSE-CAPILLARY: 174 mg/dL — AB (ref 65–99)
GLUCOSE-CAPILLARY: 144 mg/dL — AB (ref 65–99)
GLUCOSE-CAPILLARY: 141 mg/dL — AB (ref 65–99)
Glucose-Capillary: 153 mg/dL — ABNORMAL HIGH (ref 65–99)
Glucose-Capillary: 159 mg/dL — ABNORMAL HIGH (ref 65–99)

## 2016-08-15 LAB — LIPID PANEL
Cholesterol: 139 mg/dL (ref 0–200)
Cholesterol: 121 mg/dL (ref 0–200)
HDL: 39 mg/dL — AB (ref >40)
HDL: 35 mg/dL — AB (ref >40)
LDL CALC: 86 mg/dL (ref 0–99)
LDL Cholesterol: 72 mg/dL (ref 0–99)
TRIGLYCERIDES: 69 mg/dL (ref <150)
Total CHOL/HDL Ratio: 3.6 RATIO
Total CHOL/HDL Ratio: 3.5 RATIO
Triglycerides: 71 mg/dL (ref <150)
VLDL: 14 mg/dL (ref 0–40)
VLDL: 14 mg/dL (ref 0–40)

## 2016-08-15 LAB — BLOOD CULTURE ID PANEL (REFLEXED)
ACINETOBACTER BAUMANNII: NOT DETECTED
CANDIDA ALBICANS: NOT DETECTED
CANDIDA KRUSEI: NOT DETECTED
CANDIDA PARAPSILOSIS: NOT DETECTED
CANDIDA TROPICALIS: NOT DETECTED
Candida glabrata: NOT DETECTED
ENTEROBACTER CLOACAE COMPLEX: NOT DETECTED
ENTEROBACTERIACEAE SPECIES: NOT DETECTED
Enterococcus species: NOT DETECTED
Escherichia coli: NOT DETECTED
HAEMOPHILUS INFLUENZAE: NOT DETECTED
KLEBSIELLA PNEUMONIAE: NOT DETECTED
Klebsiella oxytoca: NOT DETECTED
Listeria monocytogenes: NOT DETECTED
Methicillin resistance: DETECTED — AB
Neisseria meningitidis: NOT DETECTED
PSEUDOMONAS AERUGINOSA: NOT DETECTED
Proteus species: NOT DETECTED
STREPTOCOCCUS PYOGENES: NOT DETECTED
Serratia marcescens: NOT DETECTED
Staphylococcus aureus (BCID): NOT DETECTED
Staphylococcus species: DETECTED — AB
Streptococcus agalactiae: NOT DETECTED
Streptococcus pneumoniae: NOT DETECTED
Streptococcus species: NOT DETECTED

## 2016-08-15 LAB — BASIC METABOLIC PANEL
ANION GAP: 9 (ref 5–15)
BUN: 21 mg/dL — ABNORMAL HIGH (ref 6–20)
CHLORIDE: 103 mmol/L (ref 101–111)
CO2: 23 mmol/L (ref 22–32)
Calcium: 8.3 mg/dL — ABNORMAL LOW (ref 8.9–10.3)
Creatinine, Ser: 1.36 mg/dL — ABNORMAL HIGH (ref 0.61–1.24)
GFR calc non Af Amer: 50 mL/min — ABNORMAL LOW (ref >60)
GFR, EST AFRICAN AMERICAN: 58 mL/min — AB (ref >60)
Glucose, Bld: 173 mg/dL — ABNORMAL HIGH (ref 65–99)
POTASSIUM: 4.3 mmol/L (ref 3.5–5.1)
SODIUM: 135 mmol/L (ref 135–145)

## 2016-08-15 LAB — CBC
HEMATOCRIT: 26.3 % — AB (ref 40.0–52.0)
Hemoglobin: 8.8 g/dL — ABNORMAL LOW (ref 13.0–18.0)
MCH: 31.3 pg (ref 26.0–34.0)
MCHC: 33.6 g/dL (ref 32.0–36.0)
MCV: 93.2 fL (ref 80.0–100.0)
Platelets: 60 10*3/uL — ABNORMAL LOW (ref 150–440)
RBC: 2.83 MIL/uL — AB (ref 4.40–5.90)
RDW: 20.8 % — AB (ref 11.5–14.5)
WBC: 5.5 10*3/uL (ref 3.8–10.6)

## 2016-08-15 LAB — TROPONIN I
TROPONIN I: 0.47 ng/mL — AB (ref <0.03)
TROPONIN I: 0.72 ng/mL — AB (ref <0.03)

## 2016-08-15 LAB — PROTIME-INR
INR: 1.31
Prothrombin Time: 16.4 seconds — ABNORMAL HIGH (ref 11.4–15.2)

## 2016-08-15 LAB — PROCALCITONIN: PROCALCITONIN: 0.38 ng/mL

## 2016-08-15 LAB — TSH: TSH: 0.629 u[IU]/mL (ref 0.350–4.500)

## 2016-08-15 LAB — APTT: aPTT: 36 seconds (ref 24–36)

## 2016-08-15 LAB — MRSA PCR SCREENING: MRSA by PCR: NEGATIVE

## 2016-08-15 LAB — LACTIC ACID, PLASMA: LACTIC ACID, VENOUS: 1.2 mmol/L (ref 0.5–1.9)

## 2016-08-15 MED ORDER — METHYLPREDNISOLONE SODIUM SUCC 125 MG IJ SOLR
60.0000 mg | Freq: Four times a day (QID) | INTRAMUSCULAR | Status: DC
  Administered 2016-08-15 (×2): 60 mg via INTRAVENOUS
  Filled 2016-08-15 (×2): qty 2

## 2016-08-15 MED ORDER — DEXTROSE 5 % IV SOLN
2.0000 g | Freq: Two times a day (BID) | INTRAVENOUS | Status: DC
  Administered 2016-08-15 (×2): 2 g via INTRAVENOUS
  Filled 2016-08-15 (×4): qty 2

## 2016-08-15 MED ORDER — IPRATROPIUM BROMIDE 0.02 % IN SOLN
0.5000 mg | Freq: Four times a day (QID) | RESPIRATORY_TRACT | Status: DC
  Administered 2016-08-15 – 2016-08-16 (×4): 0.5 mg via RESPIRATORY_TRACT
  Filled 2016-08-15 (×5): qty 2.5

## 2016-08-15 MED ORDER — BUDESONIDE 0.5 MG/2ML IN SUSP
0.5000 mg | Freq: Two times a day (BID) | RESPIRATORY_TRACT | Status: DC
  Administered 2016-08-15 – 2016-08-16 (×2): 0.5 mg via RESPIRATORY_TRACT
  Filled 2016-08-15 (×2): qty 2

## 2016-08-15 MED ORDER — LORAZEPAM 2 MG/ML IJ SOLN
0.5000 mg | Freq: Once | INTRAMUSCULAR | Status: AC
  Administered 2016-08-15: 0.5 mg via INTRAVENOUS
  Filled 2016-08-15: qty 1

## 2016-08-15 MED ORDER — LORAZEPAM 2 MG/ML IJ SOLN
0.5000 mg | INTRAMUSCULAR | Status: DC | PRN
  Administered 2016-08-15 – 2016-08-16 (×3): 0.5 mg via INTRAVENOUS
  Filled 2016-08-15 (×3): qty 1

## 2016-08-15 MED ORDER — CHLORHEXIDINE GLUCONATE 0.12 % MT SOLN
15.0000 mL | Freq: Two times a day (BID) | OROMUCOSAL | Status: DC
  Administered 2016-08-15 (×2): 15 mL via OROMUCOSAL
  Filled 2016-08-15: qty 15

## 2016-08-15 MED ORDER — MORPHINE SULFATE (PF) 4 MG/ML IV SOLN
1.0000 mg | INTRAVENOUS | Status: DC | PRN
  Administered 2016-08-15 – 2016-08-16 (×4): 1 mg via INTRAVENOUS
  Filled 2016-08-15 (×4): qty 1

## 2016-08-15 MED ORDER — VANCOMYCIN HCL 10 G IV SOLR
1250.0000 mg | Freq: Two times a day (BID) | INTRAVENOUS | Status: DC
  Administered 2016-08-15 (×2): 1250 mg via INTRAVENOUS
  Filled 2016-08-15 (×4): qty 1250

## 2016-08-15 MED ORDER — ALBUTEROL SULFATE (2.5 MG/3ML) 0.083% IN NEBU: 2.5000 mg | INHALATION_SOLUTION | RESPIRATORY_TRACT | Status: DC | PRN

## 2016-08-15 MED ORDER — MORPHINE SULFATE (PF) 4 MG/ML IV SOLN: 2.0000 mg | Freq: Once | INTRAVENOUS | Status: DC

## 2016-08-15 MED ORDER — METHYLPREDNISOLONE SODIUM SUCC 125 MG IJ SOLR
80.0000 mg | Freq: Two times a day (BID) | INTRAMUSCULAR | Status: DC
  Administered 2016-08-15 – 2016-08-16 (×3): 80 mg via INTRAVENOUS
  Filled 2016-08-15 (×3): qty 2

## 2016-08-15 MED ORDER — SENNOSIDES-DOCUSATE SODIUM 8.6-50 MG PO TABS
1.0000 | ORAL_TABLET | Freq: Every day | ORAL | Status: DC
  Administered 2016-08-15: 1 via ORAL
  Filled 2016-08-15: qty 1

## 2016-08-15 MED ORDER — SODIUM CHLORIDE 0.9 % IV BOLUS (SEPSIS)
750.0000 mL | Freq: Once | INTRAVENOUS | Status: AC
  Administered 2016-08-15: 750 mL via INTRAVENOUS

## 2016-08-15 MED ORDER — GUAIFENESIN-DM 100-10 MG/5ML PO SYRP
5.0000 mL | ORAL_SOLUTION | Freq: Four times a day (QID) | ORAL | Status: DC | PRN
  Administered 2016-08-15 – 2016-08-16 (×2): 5 mL via ORAL
  Filled 2016-08-15 (×2): qty 5

## 2016-08-15 MED ORDER — ORAL CARE MOUTH RINSE
15.0000 mL | Freq: Two times a day (BID) | OROMUCOSAL | Status: DC
  Administered 2016-08-15 – 2016-08-16 (×2): 15 mL via OROMUCOSAL

## 2016-08-15 NOTE — Consult Note (Signed)
Name: Marcus SHANNAHAN MRN: [id number] DOB: Oct 13, 1945    ADMISSION DATE:  08/11/2016 CONSULTATION DATE:  08/15/16  REFERRING MD :  Dr. Pollyann Samples  CHIEF COMPLAINT:  Respiratory distress  BRIEF PATIENT DESCRIPTION: SIGNIFICANT EVENTS : 71 year old male with recent diagnosis of stage 3 lung cancer, on chemotherapy now presenting with progressive shortness of breath  Secondary to worsening PNA requiring BiPAP.    STUDIES:  08/05/16>>Extensive radiation changes involving the left paramediastinallung with interval decrease in size of the left upper lobe neoplasm.. Several new patchy airspace opacities in the right lung mostlikely inflammatory or infectious. Recommend short-term followupnoncontrast chest CT in 3 months to reassess. There is also a similar process at the left lung base.  No enlarged mediastinal or hilar nodes.. Stable underlying interstitial lung disease and emphysema. No findings for upper abdominal metastatic disease or osseousmetastatic disease.   HISTORY OF PRESENT ILLNESS:   Marcus Cortez is a  71 year old male with  Recent diagnosis of stage 3 adenocarcinoma of the lungs on chemotherapy, Hypertension, Hyperlipidemia, COPD on 2L of O2 at the baseline.  Was seen by Dr. Mortimer Fries in the Office ON 6/4 and was given Augmentin for progressive worsening of PNA On 6/6 Patient presents to the ED with progressive shortness of breath for the last 3-4 days. He also has has productive cough which is yellow in color. When EMS arrived on the scene the patient's sats were down to 60%. Patient was placed on NRB and was [facility] the [facility].  Patient was placed on BiPAP and was sent to the ICU for further management.  Patient is a DNR/DNI.   PAST MEDICAL HISTORY :   has a past medical history of Allergy; Cancer ([place]) (03/2016); Carotid artery bruit; CINV (chemotherapy-induced nausea and vomiting); Emphysema lung ([place]); Emphysema of lung ([place]); Former smoker; Heart murmur; Hyperlipidemia; Hypertension; IFG  (impaired fasting glucose); Osteoporosis; and Pulmonary stenosis.  has a past surgical history that includes Flexible bronchoscopy (N/A, 04/10/2016); Eye surgery (2010); and Carotid PTA/Stent Intervention (Right, 05/22/2016). Prior to Admission medications   Medication Sig Start Date End Date Taking? Authorizing Provider  acetaminophen (TYLENOL) 325 MG tablet Take 325-650 mg by mouth every 6 (six) hours as needed (for pain/headache.).    Yes [provider]  albuterol (PROVENTIL HFA;VENTOLIN HFA) 108 (90 Base) MCG/ACT inhaler Inhale 2 puffs into the lungs every 6 (six) hours as needed for wheezing or shortness of breath. 08/03/16  Yes Fritzi Mandes, MD  amLODipine (NORVASC) 10 MG tablet Take 1 tablet (10 mg total) by mouth daily. 05/13/16  Yes Minna Merritts, MD  amoxicillin-clavulanate (AUGMENTIN) 875-125 MG tablet Take 1 tablet by mouth 2 (two) times daily. 08/12/16 08/22/16 Yes Flora Lipps, MD  aspirin 81 MG tablet Take 81 mg by mouth daily.    Yes [provider]  budesonide (PULMICORT) 0.5 MG/2ML nebulizer solution Take 2 mLs (0.5 mg total) by nebulization 2 (two) times daily. 08/12/16  Yes Flora Lipps, MD  ezetimibe (ZETIA) 10 MG tablet Take 1 tablet (10 mg total) by mouth daily. 05/13/16 05/13/17 Yes Gollan, Kathlene November, MD  feeding supplement, ENSURE ENLIVE, (ENSURE ENLIVE) LIQD Take 237 mLs by mouth 2 (two) times daily between meals. 08/03/16  Yes Fritzi Mandes, MD  gemfibrozil (LOPID) 600 MG tablet Take 600 mg by mouth at bedtime.    Yes [provider]  ipratropium-albuterol (DUONEB) 0.5-2.5 (3) MG/3ML SOLN Take 3 mLs by nebulization every 4 (four) hours as needed. Patient taking differently: Take  3 mLs by nebulization every 4 (four) hours as needed (shortness of breath).  08/08/16  Yes Cammie Sickle, MD  losartan (COZAAR) 100 MG tablet Take 1 tablet (100 mg total) by mouth daily. Patient taking differently: Take 50 mg by mouth daily. BASED ON BLOOD PRESSURE READINGS  05/13/16 05/13/17 Yes Gollan, Kathlene November, MD  ondansetron (ZOFRAN) 8 MG tablet Take 8 mg by mouth 3 times/day as needed-between meals & bedtime. 05/30/16  Yes [provider]  formoterol (PERFOROMIST) 20 MCG/2ML nebulizer solution Take 2 mLs (20 mcg total) by nebulization 2 (two) times daily. Patient not taking: Reported on 08/20/2016 08/12/16   Flora Lipps, MD   No Known Allergies  FAMILY HISTORY:  family history includes Alzheimer's disease in his father and paternal grandmother; Cancer in his maternal grandfather; Glaucoma in his mother; Hypertension in his mother; Stroke in his maternal grandmother and paternal grandfather; Thyroid disease in his mother. SOCIAL HISTORY:  reports that he quit smoking about 19 months ago. His smoking use included Cigarettes. He has a 17.50 pack-year smoking history. He quit smokeless tobacco use about 38 years ago. His smokeless tobacco use included Chew. He reports that he does not drink alcohol or use drugs.  REVIEW OF SYSTEMS:   Constitutional: Negative for fever, chills, weight loss, malaise/fatigue and diaphoresis.  HENT: Negative for hearing loss, ear pain, nosebleeds, congestion, sore throat, neck pain, tinnitus and ear discharge.   Eyes: Negative for blurred vision, double vision, photophobia, pain, discharge and redness.  Respiratory: Negative for cough, hemoptysis, sputum production, shortness of breath, wheezing and stridor.   Cardiovascular: Negative for chest pain, palpitations, orthopnea, claudication, leg swelling and PND.  Gastrointestinal: Negative for heartburn, nausea, vomiting, abdominal pain, diarrhea, constipation, blood in stool and melena.  Genitourinary: Negative for dysuria, urgency, frequency, hematuria and flank pain.  Musculoskeletal: Negative for myalgias, back pain, joint pain and falls.  Skin: Negative for itching and rash.  Neurological: Negative for dizziness, tingling, tremors, sensory change, speech change, focal weakness,  seizures, loss of consciousness, weakness and headaches.  Endo/Heme/Allergies: Negative for environmental allergies and polydipsia. Does not bruise/bleed easily.  SUBJECTIVE: Patient is tachypnic  And is on BiPAP  VITAL SIGNS: Temp:  [99.4 F (37.4 C)] 99.4 F (37.4 C) (06/06 2124) Pulse Rate:  [93-109] 95 (06/06 2330) Resp:  [22-34] 28 (06/06 2330) BP: (86-106)/(58-78) 101/69 (06/06 2330) SpO2:  [63 %-100 %] 95 % (06/06 2330) Weight:  [88.5 kg (195 lb)] 88.5 kg (195 lb) (06/06 2125)  PHYSICAL EXAMINATION: General:  Well built, Caucasian male in some respiratory distress Neuro: Awake,alert and oriented HEENT:  AT,[place], no JVD,PERRLA Cardiovascular:  Tacycardic, regular, no m/r/g Lungs:  Diminished air entry on left, no wheezes,crackles,rhonchi noted Abdomen:  Soft,NT,ND Musculoskeletal:  No edema,cyanosis noted Skin:  Warm,dry and intact   Recent Labs Lab 08/19/2016 2104  NA 135  K 4.3  CL 99*  CO2 23  BUN 22*  CREATININE 1.45*  GLUCOSE 148*    Recent Labs Lab 08/28/2016 2104  HGB 10.0*  HCT 29.4*  WBC 6.6  PLT 70*   Dg Chest Portable 1 View  Result Date: 08/23/2016 CLINICAL DATA:  Shortness of breath. EXAM: PORTABLE CHEST 1 VIEW COMPARISON:  Radiographs of June 12, 2016.  CT scan of Jul 31, 2016. FINDINGS: Stable cardiomegaly. No pneumothorax is noted. Increased left perihilar and basilar opacity is noted concerning for pneumonia. Increased right upper lobe opacity is noted concerning for pneumonia. Mild diffuse interstitial densities are noted concerning for edema or  inflammation. Bony thorax is unremarkable. IMPRESSION: Left perihilar and basilar opacity is noted concerning for pneumonia. Increased right upper lobe opacity is noted also concerning for pneumonia. Electronically Signed   By: Marijo Conception, M.D.   On: 08/19/2016 21:27    ASSESSMENT / PLAN:  Acute on chronic respiratory failure secondary to worsening Pneumonia Stage 3 Adenocarcinoma of the  lung Pulmonary fibrosis COPD Elevated Troponin secondary to demand ischemia Acute kidney injury in the setting of sepsis Thrombocytopenia  Plan Continue Bipap , wean as tolerated Hydration with fluids Agree with vanc/cefipime to treat HAP Follow cultures Continue Bronchodilators Steroids, taper Palliative care consulted Trend Troponin Dr. Rogue Bussing follows him for Ca treatment. Follow CBC,BMP Rest per Primary   Bincy Varughese,AG-ACNP Pulmonary and [place]   08/15/2016, 12:23 AM   PCCM ATTENDING ATTESTATION:  I have evaluated patient with the APP Varughese, reviewed database in its entirety and discussed care plan in detail. In addition, this patient was discussed on multidisciplinary rounds.   Important exam findings: Comfortable off of BiPAP on HFNC No wheezes Regular, no murmurs Abdomen soft Extremities warm, no edema  CXR (06/06): Extensive consolidation of left lung (progressed from previously), patchy infiltrates on the right  Major problems addressed by PCCM team: Acute on chronic hypoxemic respiratory failure Lepidic adenocarcinoma of the lung Extensive bilateral pulmonary infiltrates - this represents either progressive cancer, acute infection or a pneumonitis/inflammatory process   PLAN/REC: Continue high flow nasal cannula oxygen Continue BiPAP as needed Continue empiric antibiotics Continue systemic steroids - dose adjusted Continue empiric nebulized bronchodilators Patient is DNR/DO NOT INTUBATE. This is appropriate given the status of his cancer   Merton Border, MD PCCM service Mobile [phone] Pager [phone] 08/15/2016 11:57 AM

## 2016-08-15 NOTE — ED Notes (Signed)
Report to [place], RN CCU

## 2016-08-15 NOTE — Progress Notes (Signed)
[place] [facility] [place] NAME: Marcus Cortez    MR#:  [id number]  DATE OF BIRTH:  1945-06-24  SUBJECTIVE:  CHIEF COMPLAINT:   Chief Complaint  Patient presents with  . Respiratory Distress    Stage 3 lung cancer , came with respi distress.  REVIEW OF SYSTEMS:  CONSTITUTIONAL: No fever, fatigue or weakness.  EYES: No blurred or double vision.  EARS, NOSE, AND THROAT: No tinnitus or ear pain.  RESPIRATORY: positive for cough, shortness of breath, wheezing ,no hemoptysis.  CARDIOVASCULAR: No chest pain, orthopnea, edema.  GASTROINTESTINAL: No nausea, vomiting, diarrhea or abdominal pain.  GENITOURINARY: No dysuria, hematuria.  ENDOCRINE: No polyuria, nocturia,  HEMATOLOGY: No anemia, easy bruising or bleeding SKIN: No rash or lesion. MUSCULOSKELETAL: No joint pain or arthritis.   NEUROLOGIC: No tingling, numbness, weakness.  PSYCHIATRY: No anxiety or depression.   ROS  DRUG ALLERGIES:  No Known Allergies  VITALS:  Blood pressure 111/73, pulse 75, temperature 98.4 F (36.9 C), temperature source Oral, resp. rate (!) 27, height 6\' 1"  (1.854 m), weight 89.8 kg (197 lb 15.6 oz), SpO2 92 %.  PHYSICAL EXAMINATION:  GENERAL:  71 y.o.-year-old patient lying in the bed with some respi distress.  EYES: Pupils equal, round, reactive to light and accommodation. No scleral icterus. Extraocular muscles intact.  HEENT: Head atraumatic, normocephalic. Oropharynx and nasopharynx clear.  NECK:  Supple, no jugular venous distention. No thyroid enlargement, no tenderness.  LUNGS: Decreased air entry on left, some wheezing, positive use of accessory muscles of respiration. On HFNC. CARDIOVASCULAR: S1, S2 normal. No murmurs, rubs, or gallops.  ABDOMEN: Soft, nontender, nondistended. Bowel sounds present. No organomegaly or mass.  EXTREMITIES: No pedal edema, cyanosis, or clubbing.  NEUROLOGIC: Cranial nerves II through XII are intact. Muscle strength 5/5 in  all extremities. Sensation intact. Gait not checked.  PSYCHIATRIC: The patient is alert and oriented x 3.  SKIN: No obvious rash, lesion, or ulcer.   Physical Exam LABORATORY PANEL:   CBC  Recent Labs Lab 08/15/16 0103  WBC 5.5  HGB 8.8*  HCT 26.3*  PLT 60*   ------------------------------------------------------------------------------------------------------------------  Chemistries   Recent Labs Lab 08/15/16 0103  NA 135  K 4.3  CL 103  CO2 23  GLUCOSE 173*  BUN 21*  CREATININE 1.36*  CALCIUM 8.3*   ------------------------------------------------------------------------------------------------------------------  Cardiac Enzymes  Recent Labs Lab 08/15/16 0103 08/15/16 0719  TROPONINI 0.47* 0.72*   ------------------------------------------------------------------------------------------------------------------  RADIOLOGY:  Dg Chest Portable 1 View  Result Date: 08/21/2016 CLINICAL DATA:  Shortness of breath. EXAM: PORTABLE CHEST 1 VIEW COMPARISON:  Radiographs of June 12, 2016.  CT scan of Jul 31, 2016. FINDINGS: Stable cardiomegaly. No pneumothorax is noted. Increased left perihilar and basilar opacity is noted concerning for pneumonia. Increased right upper lobe opacity is noted concerning for pneumonia. Mild diffuse interstitial densities are noted concerning for edema or inflammation. Bony thorax is unremarkable. IMPRESSION: Left perihilar and basilar opacity is noted concerning for pneumonia. Increased right upper lobe opacity is noted also concerning for pneumonia. Electronically Signed   By: Marijo Conception, M.D.   On: 08/12/2016 21:27    ASSESSMENT AND PLAN:   Active Problems:   Sepsis due to pneumonia ([place])   Acute respiratory failure with hypoxia (HCC)   Goals of care, counseling/discussion   Palliative care encounter  #. Sepsis secondary to HCAP - in stepdown with telemetry monitoring - IV antibiotics: Cefepime, Vanco - IV fluid hydration -  Follow up  blood,urine & sputum cultures - palliative care consult ordered.  #. Acute respiratory failure 2/2 above, in setting of COPD, chronic respiratory failure - BiPAP as per CCM- now on HFNC - Continue Solumedrol -Continue Pulmicort  #. Elevated troponin, likely demand ischemia - Trend trops - Monitor on telemetry  #. History of CKD, stable, baseline - Continue to monitor BMP  #. History of HTN - Hold Norvasc, Cozaar secondary to hypotension  #. History of hyperlipidemia - Continue Zetia, Lopid    All the records are reviewed and case discussed with Care Management/Social Workerr. Management plans discussed with the patient, family and they are in agreement.  CODE STATUS: DNR  TOTAL TIME TAKING CARE OF THIS PATIENT: 35 minutes.     POSSIBLE D/C IN 1-2 DAYS, DEPENDING ON CLINICAL CONDITION.   Vaughan Basta M.D on 08/15/2016   Between 7am to 6pm - Pager - [phone]  After 6pm go to [email or web address] - password [email or web address] [facility]  Office  [phone]  CC: Primary care physician; Kathrine Haddock, NP  Note: This dictation was prepared with Dragon dictation along with smaller phrase technology. Any transcriptional errors that result from this process are unintentional.

## 2016-08-15 NOTE — Progress Notes (Signed)
PHARMACY - PHYSICIAN COMMUNICATION CRITICAL VALUE ALERT - BLOOD CULTURE IDENTIFICATION (BCID)  Results for orders placed or performed during the hospital encounter of 08/21/2016  Blood Culture ID Panel (Reflexed) (Collected: 08/20/2016  9:04 PM)  Result Value Ref Range   Enterococcus species NOT DETECTED NOT DETECTED   Listeria monocytogenes NOT DETECTED NOT DETECTED   Staphylococcus species DETECTED (A) NOT DETECTED   Staphylococcus aureus NOT DETECTED NOT DETECTED   Methicillin resistance DETECTED (A) NOT DETECTED   Streptococcus species NOT DETECTED NOT DETECTED   Streptococcus agalactiae NOT DETECTED NOT DETECTED   Streptococcus pneumoniae NOT DETECTED NOT DETECTED   Streptococcus pyogenes NOT DETECTED NOT DETECTED   Acinetobacter baumannii NOT DETECTED NOT DETECTED   Enterobacteriaceae species NOT DETECTED NOT DETECTED   Enterobacter cloacae complex NOT DETECTED NOT DETECTED   Escherichia coli NOT DETECTED NOT DETECTED   Klebsiella oxytoca NOT DETECTED NOT DETECTED   Klebsiella pneumoniae NOT DETECTED NOT DETECTED   Proteus species NOT DETECTED NOT DETECTED   Serratia marcescens NOT DETECTED NOT DETECTED   Haemophilus influenzae NOT DETECTED NOT DETECTED   Neisseria meningitidis NOT DETECTED NOT DETECTED   Pseudomonas aeruginosa NOT DETECTED NOT DETECTED   Candida albicans NOT DETECTED NOT DETECTED   Candida glabrata NOT DETECTED NOT DETECTED   Candida krusei NOT DETECTED NOT DETECTED   Candida parapsilosis NOT DETECTED NOT DETECTED   Candida tropicalis NOT DETECTED NOT DETECTED    Name of physician (or Provider) Contacted: Bincy Varughese   Changes to prescribed antibiotics required: No, will continue pt on vancomycin.   Zimri Brennen D 08/15/2016  10:08 PM

## 2016-08-15 NOTE — Progress Notes (Signed)
Pharmacy Antibiotic Note  Marcus Cortez is a 71 y.o. male admitted on 08/17/2016 with pneumonia.  Pharmacy has been consulted for vanc/cefepime dosing.  Plan: Patient received vanc 1g IV x 1 in ED Will continue w/ vanc 1.25g IV q12h w/ 6 hour stack dosing. Will draw vanc trough 6/8 @ 2100 prior to 4th dose. Goal trough 15 - 20 mcg/mL  Will start cefepime 2g IV q12h per CrCl 30 - 60 ml/min  Height: 6\' 1"  (185.4 cm) Weight: 195 lb (88.5 kg) IBW/kg (Calculated) : 79.9  Temp (24hrs), Avg:99.4 F (37.4 C), Min:99.4 F (37.4 C), Max:99.4 F (37.4 C)   Recent Labs Lab 09/06/2016 2104  WBC 6.6  CREATININE 1.45*  LATICACIDVEN 2.5*    Estimated Creatinine Clearance: 52 mL/min (A) (by C-G formula based on SCr of 1.45 mg/dL (H)).    No Known Allergies   Thank you for allowing pharmacy to be a part of this patient's care.  Tobie Lords, PharmD, BCPS Clinical Pharmacist 08/15/2016

## 2016-08-15 NOTE — Consult Note (Signed)
Consultation Note Date: 08/15/2016   Patient Name: Marcus Cortez  DOB: 12/27/1945  MRN: [id number]  Age / Sex: 71 y.o., male  PCP: Kathrine Haddock, NP Referring Physician: Vaughan Basta, *  Reason for Consultation: Establishing goals of care and Terminal Care  HPI/Patient Profile: 71 y.o. male  with past medical history of stage III Lung ca, COPD on 2L [place], HTN, HLD, pulmonary fibrosis admitted on 08/09/2016 with sepsis pneumonia. He is now requiring HFNC 80% and still SOB with hypoxia.   Clinical Assessment and Goals of Care: I met today with Marcus Cortez along with his two daughters, Marcus Cortez and Marcus Cortez. We discussed his cancer diagnosis and likely progression along with COPD. We discussed his SOB and high oxygen needs at this time. They all have good understanding and are very tearful. They understand that he is at EOL. They are tearful but his daughters just want him to be comfortable and not to suffer. Marcus Cortez tearfully tells me that she did not think he was going to live through the night when first admitted. Marcus Cortez is tearful thinking of dying and mostly of leaving his family.   We further discussed our goals at this time given their good understanding of the situation we are in. Marcus Cortez wishes to continue current treatments with the hope that he may have some response and have more time. He does not want to be intubated - "what is the point if I am dying from cancer anyway." However, he would consider trying the BiPAP again if needed. He also wishes to be comfortable. We discussed options of hospice facility and hospice at home. Marcus Cortez says that their mother/his wife died recently with ovarian cancer and had hospice care but she declined quickly and only had hospice for two weeks.   Primary Decision Maker PATIENT and then 3 daughters    SUMMARY OF RECOMMENDATIONS   - Continue current treatments - Hopeful  for some improvement - Manage symptoms for comfort - Transition to comfort with continued decline - Open to hospice options if stabilizes enough (currently on HFNC)  Code Status/Advance Care Planning:  DNR   Symptom Management:   Pain/dyspnea: Morphine IV 1-2 mg every hour prn.   Anxiety: Ativan 0.5 mg every 4 hours prn.   Constipation: Senokot-S 1 tablet qhs. LBM 3 days ago. May need increase.   Palliative Prophylaxis:   Aspiration, Bowel Regimen, Delirium Protocol, Frequent Pain Assessment, Oral Care and Turn Reposition  Psycho-social/Spiritual:   Desire for further Chaplaincy support:no  Additional Recommendations: Caregiving  Support/Resources, Education on Hospice and Grief/Bereavement Support  Prognosis:   Unable to determine - prognosis is very poor and could be days.  Discharge Planning: To Be Determined      Primary Diagnoses: Present on Admission: . Sepsis due to pneumonia [facility])   I have reviewed the medical record, interviewed the patient and family, and examined the patient. The following aspects are pertinent.  Past Medical History:  Diagnosis Date  . Allergy   . Cancer ([place]) 03/2016  lung  . Carotid artery bruit   . CINV (chemotherapy-induced nausea and vomiting)   . Emphysema lung ([place])   . Emphysema of lung ([place])   . Former smoker   . Heart murmur   . Hyperlipidemia   . Hypertension   . IFG (impaired fasting glucose)   . Osteoporosis   . Pulmonary stenosis    Social History   Social History  . Marital status: Widowed    Spouse name: N/A  . Number of children: N/A  . Years of education: N/A   Social History Main Topics  . Smoking status: Former Smoker    Packs/day: 0.50    Years: 35.00    Types: Cigarettes    Quit date: 01/10/2015  . Smokeless tobacco: Former Systems developer    Types: Chew    Quit date: 04/10/1978  . Alcohol use No  . Drug use: No  . Sexual activity: Yes   Other Topics Concern  . None   Social History Narrative    . None   Family History  Problem Relation Age of Onset  . Hypertension Mother   . Thyroid disease Mother   . Glaucoma Mother   . Alzheimer's disease Father   . Stroke Maternal Grandmother   . Cancer Maternal Grandfather   . Alzheimer's disease Paternal Grandmother   . Stroke Paternal Grandfather    Scheduled Meds: . aspirin EC  81 mg Oral Daily  . budesonide  0.5 mg Nebulization BID  . chlorhexidine  15 mL Mouth Rinse BID  . enoxaparin (LOVENOX) injection  40 mg Subcutaneous Q24H  . ezetimibe  10 mg Oral Daily  . insulin aspart  0-15 Units Subcutaneous Q4H  . ipratropium  0.5 mg Nebulization Q6H  . mouth rinse  15 mL Mouth Rinse q12n4p  . methylPREDNISolone (SOLU-MEDROL) injection  80 mg Intravenous Q12H  . senna-docusate  1 tablet Oral QHS   Continuous Infusions: . sodium chloride 75 mL/hr at 08/15/16 0116  . ceFEPime (MAXIPIME) IV Stopped (08/15/16 1109)  . vancomycin Stopped (08/15/16 1218)   PRN Meds:.acetaminophen **OR** acetaminophen, albuterol, bisacodyl, LORazepam, magnesium citrate, morphine injection, ondansetron **OR** ondansetron (ZOFRAN) IV, oxyCODONE No Known Allergies Review of Systems  Constitutional: Positive for activity change and fatigue. Negative for appetite change.  Respiratory: Positive for shortness of breath.   Neurological: Positive for weakness.    Physical Exam  Constitutional: He is oriented to person, place, and time. He appears well-developed.  HENT:  Head: Normocephalic and atraumatic.  Cardiovascular: Normal rate.   Pulmonary/Chest: Accessory muscle usage present. Tachypnea noted. He is in respiratory distress.  Abdominal: Soft. Normal appearance.  Neurological: He is alert and oriented to person, place, and time.  Nursing note and vitals reviewed.   Vital Signs: BP 118/75   Pulse 81   Temp 97.5 F (36.4 C) (Axillary)   Resp (!) 27   Ht 6' 1"  (1.854 m)   Wt 89.8 kg (197 lb 15.6 oz)   SpO2 92%   BMI 26.12 kg/m  Pain  Assessment: No/denies pain   Pain Score: 0-No pain   SpO2: SpO2: 92 % O2 Device:SpO2: 92 % O2 Flow Rate: .O2 Flow Rate (L/min): 50 L/min  IO: Intake/output summary:  Intake/Output Summary (Last 24 hours) at 08/15/16 1537 Last data filed at 08/15/16 1500  Gross per 24 hour  Intake          2303.75 ml  Output             1025 ml  Net          1278.75 ml    LBM:   Baseline Weight: Weight: 88.5 kg (195 lb) Most recent weight: Weight: 89.8 kg (197 lb 15.6 oz)     Palliative Assessment/Data: 20%    Time Total: 74mn  Greater than 50%  of this time was spent counseling and coordinating care related to the above assessment and plan.  Signed by: AVinie Sill NP Palliative Medicine Team Pager # [phone](M-F 8a-5p) Team Phone # [phone](Nights/Weekends)

## 2016-08-15 NOTE — Progress Notes (Signed)
RN spoke with Marda Stalker, NP and made her aware that patient is short of breath with increased work of breathing and desated down to 83% on high flow while using urinal and turning in bed.  It takes patient a while to recover. RN asked about morphine for shortness of breath.  NP stated she would order a one time dose of morphine.

## 2016-08-15 NOTE — Progress Notes (Signed)
PT Cancellation Note  Patient Details Name: Marcus Cortez MRN: [id number] DOB: 1945/06/02   Cancelled Treatment:    Reason Eval/Treat Not Completed: Medical issues which prohibited therapy. Consult received and chart reviewed. Pt currently on bipap, and has low BP. Troponin trending up at this time. Pt is not appropriate for PT at this time. Will hold until medically stable.   Renai Lopata 08/15/2016, 8:47 AM  Greggory Stallion, PT, DPT [phone]

## 2016-08-16 ENCOUNTER — Inpatient Hospital Stay: Payer: Medicare HMO

## 2016-08-16 DIAGNOSIS — C3490 Malignant neoplasm of unspecified part of unspecified bronchus or lung: Secondary | ICD-10-CM

## 2016-08-16 DIAGNOSIS — J9621 Acute and chronic respiratory failure with hypoxia: Secondary | ICD-10-CM

## 2016-08-16 DIAGNOSIS — A419 Sepsis, unspecified organism: Principal | ICD-10-CM

## 2016-08-16 DIAGNOSIS — Z7189 Other specified counseling: Secondary | ICD-10-CM

## 2016-08-16 DIAGNOSIS — Z515 Encounter for palliative care: Secondary | ICD-10-CM

## 2016-08-16 DIAGNOSIS — J189 Pneumonia, unspecified organism: Secondary | ICD-10-CM

## 2016-08-16 LAB — BASIC METABOLIC PANEL
Anion gap: 8 (ref 5–15)
BUN: 23 mg/dL — ABNORMAL HIGH (ref 6–20)
CHLORIDE: 107 mmol/L (ref 101–111)
CO2: 23 mmol/L (ref 22–32)
CREATININE: 0.88 mg/dL (ref 0.61–1.24)
Calcium: 8.3 mg/dL — ABNORMAL LOW (ref 8.9–10.3)
GFR calc non Af Amer: >60 mL/min (ref >60)
Glucose, Bld: 123 mg/dL — ABNORMAL HIGH (ref 65–99)
POTASSIUM: 4.2 mmol/L (ref 3.5–5.1)
SODIUM: 138 mmol/L (ref 135–145)

## 2016-08-16 LAB — GLUCOSE, CAPILLARY
GLUCOSE-CAPILLARY: 130 mg/dL — AB (ref 65–99)
GLUCOSE-CAPILLARY: 183 mg/dL — AB (ref 65–99)
Glucose-Capillary: 120 mg/dL — ABNORMAL HIGH (ref 65–99)

## 2016-08-16 MED ORDER — LORAZEPAM 2 MG/ML IJ SOLN
0.5000 mg | INTRAMUSCULAR | Status: DC | PRN
  Administered 2016-08-16 (×4): 0.5 mg via INTRAVENOUS
  Filled 2016-08-16 (×4): qty 1

## 2016-08-16 MED ORDER — MORPHINE SULFATE (PF) 4 MG/ML IV SOLN
2.0000 mg | INTRAVENOUS | Status: DC | PRN
  Administered 2016-08-16 (×2): 4 mg via INTRAVENOUS
  Filled 2016-08-16 (×2): qty 1

## 2016-08-16 MED ORDER — MORPHINE SULFATE (PF) 4 MG/ML IV SOLN
2.0000 mg | INTRAVENOUS | Status: DC | PRN
  Administered 2016-08-16 (×2): 2 mg via INTRAVENOUS
  Administered 2016-08-16: 4 mg via INTRAVENOUS
  Administered 2016-08-16 (×2): 2 mg via INTRAVENOUS
  Filled 2016-08-16 (×5): qty 1

## 2016-08-16 MED ORDER — FENTANYL 75 MCG/HR TD PT72
75.0000 ug | MEDICATED_PATCH | TRANSDERMAL | Status: DC
  Administered 2016-08-16: 75 ug via TRANSDERMAL
  Filled 2016-08-16: qty 1

## 2016-08-18 LAB — CULTURE, BLOOD (ROUTINE X 2)

## 2016-08-19 ENCOUNTER — Inpatient Hospital Stay: Payer: Medicare HMO | Admitting: Hematology and Oncology

## 2016-08-19 ENCOUNTER — Telehealth: Payer: Self-pay

## 2016-08-19 ENCOUNTER — Ambulatory Visit: Payer: Medicare HMO | Admitting: Radiation Oncology

## 2016-08-19 LAB — CULTURE, BLOOD (ROUTINE X 2): CULTURE: NO GROWTH

## 2016-08-19 NOTE — Telephone Encounter (Signed)
Informed [employer] that death cert is ready for pick up. Faxed to [phone] per their request. Placed up front for pick up.

## 2016-08-19 NOTE — Telephone Encounter (Signed)
Received death certificate.  Placed in nurse box per funeral home stat for cremation today

## 2016-08-19 NOTE — Telephone Encounter (Signed)
Death certificate placed in folder for DS to complete.

## 2016-09-08 NOTE — Progress Notes (Signed)
PT Cancellation Note  Patient Details Name: Marcus Cortez MRN: [id number] DOB: 06-23-1945   Cancelled Treatment:    Reason Eval/Treat Not Completed: Other (comment). Noted pt now on comfort care. Therapy orders discontinued.    Media Pizzini 09/06/2016, 11:39 AM  Greggory Stallion, PT, DPT [phone]

## 2016-09-08 NOTE — Progress Notes (Signed)
[place] at [place] NAME: Marcus Cortez    MR#:  [id number]  DATE OF BIRTH:  29-Oct-1945  SUBJECTIVE:  CHIEF COMPLAINT:   Chief Complaint  Patient presents with  . Respiratory Distress    Stage 3 lung cancer , came with respi distress.   Remains on HFNC and required Bipap overnight, appears in distress.  REVIEW OF SYSTEMS:  CONSTITUTIONAL: No fever, fatigue or weakness.  EYES: No blurred or double vision.  EARS, NOSE, AND THROAT: No tinnitus or ear pain.  RESPIRATORY: positive for cough, shortness of breath, wheezing ,no hemoptysis.  CARDIOVASCULAR: No chest pain, orthopnea, edema.  GASTROINTESTINAL: No nausea, vomiting, diarrhea or abdominal pain.  GENITOURINARY: No dysuria, hematuria.  ENDOCRINE: No polyuria, nocturia,  HEMATOLOGY: No anemia, easy bruising or bleeding SKIN: No rash or lesion. MUSCULOSKELETAL: No joint pain or arthritis.   NEUROLOGIC: No tingling, numbness, weakness.  PSYCHIATRY: No anxiety or depression.   ROS  DRUG ALLERGIES:  No Known Allergies  VITALS:  Blood pressure 124/86, pulse 84, temperature 97.7 F (36.5 C), temperature source Axillary, resp. rate (!) 36, height 6\' 1"  (1.854 m), weight 89.8 kg (197 lb 15.6 oz), SpO2 (!) 88 %.  PHYSICAL EXAMINATION:  GENERAL:  71 y.o.-year-old patient lying in the bed with some respi distress.  EYES: Pupils equal, round, reactive to light and accommodation. No scleral icterus. Extraocular muscles intact.  HEENT: Head atraumatic, normocephalic. Oropharynx and nasopharynx clear.  NECK:  Supple, no jugular venous distention. No thyroid enlargement, no tenderness.  LUNGS: Decreased air entry on left, some wheezing, positive use of accessory muscles of respiration. On HFNC. CARDIOVASCULAR: S1, S2 normal. No murmurs, rubs, or gallops.  ABDOMEN: Soft, nontender, nondistended. Bowel sounds present. No organomegaly or mass.  EXTREMITIES: No pedal edema, cyanosis, or clubbing.   NEUROLOGIC: Cranial nerves II through XII are intact. Muscle strength 4/5 in all extremities. Sensation intact. Gait not checked.  PSYCHIATRIC: The patient is alert and oriented x 3.  SKIN: No obvious rash, lesion, or ulcer.   Physical Exam LABORATORY PANEL:   CBC  Recent Labs Lab 08/15/16 0103  WBC 5.5  HGB 8.8*  HCT 26.3*  PLT 60*   ------------------------------------------------------------------------------------------------------------------  Chemistries   Recent Labs Lab 2016-08-20 0659  NA 138  K 4.2  CL 107  CO2 23  GLUCOSE 123*  BUN 23*  CREATININE 0.88  CALCIUM 8.3*   ------------------------------------------------------------------------------------------------------------------  Cardiac Enzymes  Recent Labs Lab 08/15/16 0103 08/15/16 0719  TROPONINI 0.47* 0.72*   ------------------------------------------------------------------------------------------------------------------  RADIOLOGY:  Dg Chest Port 1 View  Result Date: August 20, 2016 CLINICAL DATA:  Respiratory distress EXAM: PORTABLE CHEST 1 VIEW COMPARISON:  August 14, 2016 chest radiograph and chest CT Jul 31, 2016 FINDINGS: There is persistent airspace consolidation throughout much of the left lung with volume loss on the left. There is diffuse pulmonary fibrotic change on the right. There may be a degree of superimposed interstitial edema. Heart is enlarged with pulmonary venous hypertension. No adenopathy. No evident bone lesions. IMPRESSION: Suspect a degree of congestive heart failure superimposed on chronic fibrosis. There is extensive volume loss and consolidation on the left, likely superimposed pneumonia on the left. The overall appearance is stable compared to 2 days prior. Electronically Signed   By: Lowella Grip III M.D.   On: Aug 20, 2016 08:34   Dg Chest Portable 1 View  Result Date: 08/26/2016 CLINICAL DATA:  Shortness of breath. EXAM: PORTABLE CHEST 1 VIEW COMPARISON:  Radiographs of  June 12, 2016.  CT scan of Jul 31, 2016. FINDINGS: Stable cardiomegaly. No pneumothorax is noted. Increased left perihilar and basilar opacity is noted concerning for pneumonia. Increased right upper lobe opacity is noted concerning for pneumonia. Mild diffuse interstitial densities are noted concerning for edema or inflammation. Bony thorax is unremarkable. IMPRESSION: Left perihilar and basilar opacity is noted concerning for pneumonia. Increased right upper lobe opacity is noted also concerning for pneumonia. Electronically Signed   By: Marijo Conception, M.D.   On: 08/13/2016 21:27    ASSESSMENT AND PLAN:   Active Problems:   Sepsis ([place])   Acute respiratory failure with hypoxia (HCC)   Goals of care, counseling/discussion   Palliative care encounter   Adenocarcinoma of lung ([place])  #. Sepsis secondary to HCAP - in stepdown with telemetry monitoring - IV antibiotics - IV fluid hydration - Follow up blood,urine & sputum cultures - palliative care consult appreciated.  plan is to keep him comfortable and initiate full comfort care over the weekend, if he worsens.  #. Acute respiratory failure 2/2 above, in setting of COPD, chronic respiratory failure - BiPAP as per CCM- now on HFNC - Continue Solumedrol -Continue Pulmicort  #. Elevated troponin, likely demand ischemia - Trend trops- slight high - Monitor on telemetry  #. History of CKD, stable, baseline - Continue to monitor BMP  #. History of HTN - Hold Norvasc, Cozaar secondary to hypotension  #. History of hyperlipidemia - Continue Zetia, Lopid  # stage 3 lung cancer   Palliative care.  All the records are reviewed and case discussed with Care Management/Social Workerr. Management plans discussed with the patient, family and they are in agreement.  CODE STATUS: DNR  TOTAL TIME TAKING CARE OF THIS PATIENT: 35 minutes.   Prognosis is very poor, palliative care.  POSSIBLE D/C IN 1-2 DAYS, DEPENDING ON CLINICAL  CONDITION.   Vaughan Basta M.D on Sep 10, 2016   Between 7am to 6pm - Pager - [phone]  After 6pm go to [email or web address] - password [email or web address] [facility]  Office  [phone]  CC: Primary care physician; Kathrine Haddock, NP  Note: This dictation was prepared with Dragon dictation along with smaller phrase technology. Any transcriptional errors that result from this process are unintentional.

## 2016-09-08 NOTE — Discharge Summary (Signed)
Cause of death- health care associated pneumonia          Stage 3 lung cancer          COPD           Ch kidney disease  Brief hospital course    #. Sepsis secondary to HCAP - in stepdownwith telemetry monitoring - IV antibiotics - IV fluid hydration - Follow up blood,urine & sputum cultures - palliative care consult appreciated.  plan is to keep him comfortable and initiate full comfort care over the weekend, if he worsens.  #. Acute respiratory failure 2/2 above, in setting of COPD, chronic respiratory failure - BiPAPas per CCM- now on HFNC - Continue Solumedrol -Continue Pulmicort  #. Elevated troponin, likely demand ischemia - Trend trops- slight high - Monitor on telemetry  #. History of CKD, stable, baseline - Continue to monitor BMP  #. History of HTN - Hold Norvasc, Cozaar secondary to hypotension  #. History of hyperlipidemia - Continue Zetia, Lopid  # stage 3 lung cancer   Palliative care.  Pt was made comfort care, and died in stepdown unit.  For further details, please see Initial H & p and notes.

## 2016-09-08 NOTE — Progress Notes (Addendum)
Daily Progress Note   Patient Name: Marcus Cortez       Date: 09-08-2016 DOB: Jan 15, 1946  Age: 71 y.o. MRN#: [id number] Attending Physician: Vaughan Basta, * Primary Care Physician: Kathrine Haddock, NP Admit Date: 08/21/2016  Reason for Consultation/Follow-up: Establishing goals of care and Terminal Care  Subjective: Patient wakes to voice. Alert and oriented to person and place. On BiPAP 90% FIO2. Tachypnea with minimal exertion in the bed. Denies pain or discomfort at this time.   Daughter, Davy Pique at bedside. Again discussed diagnoses, interventions, underlying co-morbidities, and poor prognosis. Davy Pique tells me the family has a good understanding of poor prognosis and that he "will probably not leave the hospital." She becomes very tearful during the conversation. The patient is fearful of leaving family but Davy Pique also feels he is at peace with dying and seeing his wife again (who died three years ago). "He misses her so much."   Davy Pique asks about timing with transition to comfort. I encouraged she gather family members that have not yet visited with them. Educated on EOL expectations and what the transition to comfort would look like--starting a morphine infusion and ensuring he was comfortable before transition off the BiPAP. Focus would then be comfort and dignity at the end-of-life.   Daughter shares stories of her father being "outdoorsy" and enjoying time in the yard, fishing, and hunting. He has been healthy and independent up until January. "He would not want to live like this."   Answered questions and concerns. Therapeutic listen and emotional support provided.   Length of Stay: 2  Current Medications: Scheduled Meds:  . budesonide  0.5 mg Nebulization BID  . chlorhexidine   15 mL Mouth Rinse BID  . fentaNYL  75 mcg Transdermal Q72H  . ipratropium  0.5 mg Nebulization Q6H  . mouth rinse  15 mL Mouth Rinse q12n4p  . methylPREDNISolone (SOLU-MEDROL) injection  80 mg Intravenous Q12H  . senna-docusate  1 tablet Oral QHS    Continuous Infusions: . sodium chloride 75 mL/hr at 09-08-2016 0652    PRN Meds: acetaminophen **OR** acetaminophen, albuterol, bisacodyl, guaiFENesin-dextromethorphan, LORazepam, magnesium citrate, morphine injection, [DISCONTINUED] ondansetron **OR** ondansetron (ZOFRAN) IV  Physical Exam  Constitutional: He is easily aroused. He appears ill.  HENT:  Head: Normocephalic and atraumatic.  Cardiovascular: Regular rhythm.   Pulmonary/Chest: Accessory  muscle usage present. Tachypnea noted. He has decreased breath sounds.  Morphine/ativan as need for dyspnea  Abdominal: Normal appearance.  Neurological: He is alert and easily aroused.  Skin: Skin is warm and dry. There is pallor.  Psychiatric: His speech is delayed. He is inattentive.  Nursing note and vitals reviewed.          Vital Signs: BP 124/86   Pulse 87   Temp 97.7 F (36.5 C) (Axillary)   Resp (!) 21   Ht 6\' 1"  (1.854 m)   Wt 89.8 kg (197 lb 15.6 oz)   SpO2 99%   BMI 26.12 kg/m  SpO2: SpO2: 99 % O2 Device: O2 Device: Bi-PAP O2 Flow Rate: O2 Flow Rate (L/min): 50 L/min  Intake/output summary:   Intake/Output Summary (Last 24 hours) at 08/25/16 1011 Last data filed at 2016-08-25 1000  Gross per 24 hour  Intake           2362.5 ml  Output             1425 ml  Net            937.5 ml   LBM: Last BM Date: 08/12/16 Baseline Weight: Weight: 88.5 kg (195 lb) Most recent weight: Weight: 89.8 kg (197 lb 15.6 oz)       Palliative Assessment/Data: PPS 20%   Flowsheet Rows     Most Recent Value  Intake Tab  Date first seen by Palliative Care  08/15/16  Clinical Assessment  Palliative Performance Scale Score  20%  Psychosocial & Spiritual Assessment  Palliative Care  Outcomes  Patient/Family meeting held?  Yes  Who was at the meeting?  patient and daughter  Palliative Care Outcomes  Clarified goals of care, Provided end of life care assistance, Improved pain interventions, Improved non-pain symptom therapy, Provided psychosocial or spiritual support      Patient Active Problem List   Diagnosis Date Noted  . Acute respiratory failure with hypoxia ([place])   . Goals of care, counseling/discussion   . Palliative care encounter   . Sepsis due to pneumonia ([place]) 08/11/2016  . Pneumonia 08/02/2016  . Carbuncle of scrotum 07/25/2016  . Cough with fever 06/12/2016  . Carotid stenosis, symptomatic w/o infarct, right 05/22/2016  . Carotid stenosis 05/09/2016  . Primary adenocarcinoma of upper lobe of left lung ([place]) 04/24/2016  . Persistent headaches 04/24/2016  . Mass of upper lobe of left lung 04/12/2016  . Hypertension 04/08/2016  . Nocturia 01/31/2016  . Nasal polyp 01/31/2016  . Allergic rhinitis 01/20/2015  . Osteoarthritis 01/20/2015  . IFG (impaired fasting glucose) 01/20/2015  . Hyperlipidemia 01/20/2015  . Hypertriglyceridemia 01/20/2015    Palliative Care Assessment & Plan   Patient Profile: 71 y.o. male  with past medical history of stage III Lung ca, COPD on 2L [place], HTN, HLD, pulmonary fibrosis admitted on 08/27/2016 with sepsis pneumonia. He is now requiring BiPAP 90% FIO2.   Assessment: Acute on chronic respiratory failure  Stage 3 adenocarcinoma of lung Pulmonary fibrosis COPD Pneumonia Elevated troponin secondary to demand ischemia Acute kidney injury Thrombocytopenia  Recommendations/Plan:  DNR/DNI  Patient/family understand poor prognosis. Waiting for more family to visit before removing BiPAP.   Focus is comfort and symptom management for pain and dyspnea.   Allow patient to eat/drink on HFNC. OK for sats mid 80's.   Vitals q4h  Likely full transition to comfort at some point this weekend. PMT not at [facility] over the  weekend. PCCM to manage.  Likely hours when BiPAP/HFNC is discontinued.   Code Status: DNR/DNI   Code Status Orders        Start     Ordered   08/15/16 0109  Do not attempt resuscitation (DNR)  Continuous    Question Answer Comment  In the event of cardiac or respiratory ARREST Do not call a "code blue"   In the event of cardiac or respiratory ARREST Do not perform Intubation, CPR, defibrillation or ACLS   In the event of cardiac or respiratory ARREST Use medication by any route, position, wound care, and other measures to relive pain and suffering. May use oxygen, suction and manual treatment of airway obstruction as needed for comfort.      08/15/16 0109    Code Status History    Date Active Date Inactive Code Status Order ID Comments User Context   08/13/2016 11:46 PM 08/15/2016  1:09 AM Full Code [id number]  Hugelmeyer, Ubaldo Glassing, DO ED   08/02/2016 10:03 AM 08/03/2016  4:03 PM Full Code [id number]  Fritzi Mandes, MD Inpatient   05/22/2016 10:02 AM 05/23/2016  1:48 PM Full Code [id number]  Schnier, Dolores Lory, MD Inpatient    Advance Directive Documentation     Most Recent Value  Type of Advance Directive  Healthcare Power of Attorney  Pre-existing out of facility DNR order (yellow form or pink MOST form)  -  "MOST" Form in Place?  -       Prognosis:   Hours - Days-when transitioned to full comfort and off BiPAP/HFNC.   Discharge Planning:  Anticipated Hospital Death  Care plan was discussed with patient, daughter, Dr. Alva Garnet, and RN  Thank you for allowing the Palliative Medicine Team to assist in the care of this patient.   Time In: 0855 Time Out: 0930 Total Time 78min Prolonged Time Billed  no       Greater than 50%  of this time was spent counseling and coordinating care related to the above assessment and plan.  Ihor Dow, FNP-C Palliative Medicine Team  Phone: [phone] Fax: [phone]  Please contact Palliative Medicine Team phone at [phone] for  questions and concerns.

## 2016-09-08 NOTE — Progress Notes (Signed)
Placed pt back on Bipap. On 90% HFNC pt rr upper 40's. spo2 70's with obivous resp distress. RN and daughter at bedside.

## 2016-09-08 NOTE — Progress Notes (Signed)
Reported off to [place], RN to take over 1:1 care on different patient.

## 2016-09-08 NOTE — Progress Notes (Signed)
Pt.'s rhythm changed and while RN was verifying with Central Telemetry, pt. Went into asystole.  Pt.'s daughters were bedside with pt. Pt.'s HR then came back as PEA, RN verified no pulse and Central Telemetry was called to confirm.  Pt. Also taking some agonal breathes.  RN alerted Dr. Oletta Darter @ [place] who spoke with daughter Claiborne Billings and explained the situation. Pt. Went into asystole again at 43.  Nursing Supervisor, Dr. Oletta Darter & Dr. Marcille Blanco were all alerted. Dr. Oletta Darter gave a verbal order for RN may pronounce, Marcus Pulse [place], RN & Marcus Coder, RN pronounced the pt. Deceased. CDS was notified, and pt. Is not a potential donor.

## 2016-09-08 NOTE — Progress Notes (Signed)
Chaplain received page to visit patient and family in [place]. Chaplain visited with family and learned of patient in critical condition. Staff came in and pronounced patient deceased. Chaplain provided support and a spiritual presence.     August 23, 2016 1941  Clinical Encounter Type  Visited With Patient and family together  Visit Type Initial;Spiritual support;Death;Patient actively dying  Referral From Nurse  Consult/Referral To Chaplain  Spiritual Encounters  Spiritual Needs Emotional;Grief support

## 2016-09-08 NOTE — Progress Notes (Signed)
Name: Marcus Cortez MRN: [id number] DOB: 1946/03/04     BRIEF PATIENT DESCRIPTION: SIGNIFICANT EVENTS : 71 year old male with recent diagnosis of stage 3 lung cancer, on chemotherapy now presenting with progressive shortness of breath  Secondary to worsening PNA requiring BiPAP.    STUDIES:  08/05/16>>Extensive radiation changes involving the left paramediastinallung with interval decrease in size of the left upper lobe neoplasm.. Several new patchy airspace opacities in the right lung mostlikely inflammatory or infectious. Recommend short-term followupnoncontrast chest CT in 3 months to reassess. There is also a similar process at the left lung base.  No enlarged mediastinal or hilar nodes.. Stable underlying interstitial lung disease and emphysema. No findings for upper abdominal metastatic disease or osseousmetastatic disease.   SUBJECTIVE:  Pt currently on continuous Bipap resting comfortably post administration of morphine   VITAL SIGNS: Temp:  [97.4 F (36.3 C)-98.4 F (36.9 C)] 97.7 F (36.5 C) (06/08 0800) Pulse Rate:  [71-96] 84 (06/08 1100) Resp:  [19-40] 36 (06/08 1100) BP: (106-145)/(69-95) 124/86 (06/08 1000) SpO2:  [75 %-99 %] 88 % (06/08 1100) FiO2 (%):  [90 %-100 %] 100 % (06/08 1100)  PHYSICAL EXAMINATION: General: well developed Caucasian male, NAD  Neuro: lethargic, PERRL HEENT:  AT,[place], no JVD Cardiovascular:  nsr, regular, no m/r/g Lungs:  diminished air entry on left, no wheezes,crackles,rhonchi noted, even on continuous Bipap Abdomen:  +BS x4, soft,NT,ND Musculoskeletal:  no edema,cyanosis noted Skin:  warm,dry and intact   Recent Labs Lab 09/02/2016 2104 08/15/16 0103 2016/08/25 0659  NA 135 135 138  K 4.3 4.3 4.2  CL 99* 103 107  CO2 23 23 23   BUN 22* 21* 23*  CREATININE 1.45* 1.36* 0.88  GLUCOSE 148* 173* 123*    Recent Labs Lab 09/01/2016 2104 08/15/16 0103  HGB 10.0* 8.8*  HCT 29.4* 26.3*  WBC 6.6 5.5  PLT 70* 60*   Dg Chest Port 1  View  Result Date: 25-Aug-2016 CLINICAL DATA:  Respiratory distress EXAM: PORTABLE CHEST 1 VIEW COMPARISON:  August 14, 2016 chest radiograph and chest CT Jul 31, 2016 FINDINGS: There is persistent airspace consolidation throughout much of the left lung with volume loss on the left. There is diffuse pulmonary fibrotic change on the right. There may be a degree of superimposed interstitial edema. Heart is enlarged with pulmonary venous hypertension. No adenopathy. No evident bone lesions. IMPRESSION: Suspect a degree of congestive heart failure superimposed on chronic fibrosis. There is extensive volume loss and consolidation on the left, likely superimposed pneumonia on the left. The overall appearance is stable compared to 2 days prior. Electronically Signed   By: Lowella Grip III M.D.   On: 08/25/16 08:34   Dg Chest Portable 1 View  Result Date: 08/26/2016 CLINICAL DATA:  Shortness of breath. EXAM: PORTABLE CHEST 1 VIEW COMPARISON:  Radiographs of June 12, 2016.  CT scan of Jul 31, 2016. FINDINGS: Stable cardiomegaly. No pneumothorax is noted. Increased left perihilar and basilar opacity is noted concerning for pneumonia. Increased right upper lobe opacity is noted concerning for pneumonia. Mild diffuse interstitial densities are noted concerning for edema or inflammation. Bony thorax is unremarkable. IMPRESSION: Left perihilar and basilar opacity is noted concerning for pneumonia. Increased right upper lobe opacity is noted also concerning for pneumonia. Electronically Signed   By: Marijo Conception, M.D.   On: 08/09/2016 21:27    ASSESSMENT / PLAN: Acute on chronic hypoxemic respiratory failure Lepidic adenocarcinoma of the lung Extensive bilateral pulmonary infiltrates - this  represents either progressive cancer, acute infection or a pneumonitis/inflammatory process  PLAN/REC: Continue high flow nasal cannula oxygen prn Continue BiPAP as needed Continue empiric antibiotics Continue systemic  steroids  Continue empiric nebulized bronchodilators Patient is DNR/DO NOT INTUBATE Fentanyl patch and IV morphine for pain management and dyspnea   Plans to transition pt to comfort care measures only over the weekend per Palliative Care team currently waiting on more family members to arrive prior to transition to comfort care measures only  Marda Stalker, [place] Pager [phone] (please enter 7 digits) PCCM Consult Pager [phone] (please enter 7 digits)   PCCM ATTENDING ATTESTATION:  I have evaluated patient with the APP [place], reviewed database in its entirety and discussed care plan in detail. In addition, this patient was discussed on multidisciplinary rounds.   He continues to have intermittent respiratory distress and is on intermittent BiPAP. He is receiving morphine and lorazepam as needed. I initiated Duragesic patch today. I have updated the family on 2 occasions over the course the day. We are gradually transitioning to full comfort care.   Merton Border, MD PCCM service Mobile [phone] Pager [phone] 2016-08-25 4:57 PM

## 2016-09-08 NOTE — Progress Notes (Signed)
Pt. On Bipap @ 90% with NS running, RR high but otherwise VSS and pt. Comfortable. Handoff report received from [place], [place]

## 2016-09-08 DEATH — deceased

## 2016-09-14 ENCOUNTER — Other Ambulatory Visit: Payer: Self-pay | Admitting: Nurse Practitioner

## 2016-11-01 ENCOUNTER — Ambulatory Visit: Payer: Medicare HMO | Admitting: Internal Medicine

## 2017-01-14 ENCOUNTER — Telehealth: Payer: Self-pay | Admitting: Internal Medicine

## 2017-01-14 NOTE — Telephone Encounter (Signed)
Per release id #[id number], faxed copy of requested medical records to [employer] fax [phone]

## 2017-02-03 ENCOUNTER — Ambulatory Visit (INDEPENDENT_AMBULATORY_CARE_PROVIDER_SITE_OTHER): Payer: Medicare HMO | Admitting: Vascular Surgery

## 2017-02-03 ENCOUNTER — Encounter (INDEPENDENT_AMBULATORY_CARE_PROVIDER_SITE_OTHER): Payer: Medicare HMO

## 2018-09-30 IMAGING — MR MR HEAD WO/W CM
9 of 13 series · 31 of 48 positions shown · IV contrast (multihance)
Comparison: None.

CLINICAL DATA: Lung cancer and headaches. Evaluate for metastatic
disease.

EXAM:
MRI HEAD WITHOUT AND WITH CONTRAST
TECHNIQUE: Multiplanar, multiecho pulse sequences of the brain and surrounding
structures were obtained without and with intravenous contrast.
CONTRAST:  18mL MULTIHANCE GADOBENATE DIMEGLUMINE 529 MG/ML IV SOLN

[Series 4: DWI · axial · 4.0mm · 0.94mm/px · z∈[-87,+89]mm · 4 of 45 slices shown (1 of 2)]
[im 1/45]
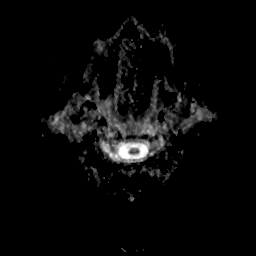
[im 15/45]
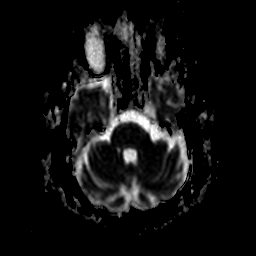
[im 30/45]
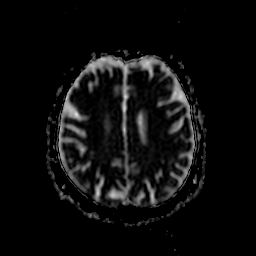
[im 45/45]
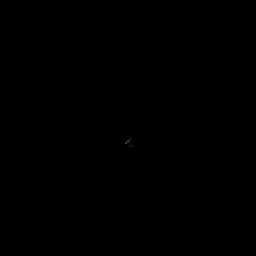

[Series 6: DWI · coronal · 5.0mm · 1.80mm/px · 4 of 40 slices shown (2 of 2)]
[im 1/40]
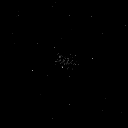
[im 14/40]
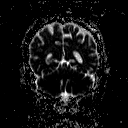
[im 27/40]
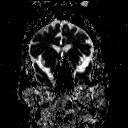
[im 40/40]
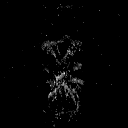

[Series 7: T2 · axial · 5.0mm · 0.45mm/px · z∈[-81,+87]mm · 3 of 27 slices shown (1 of 2)]
[im 1/27]
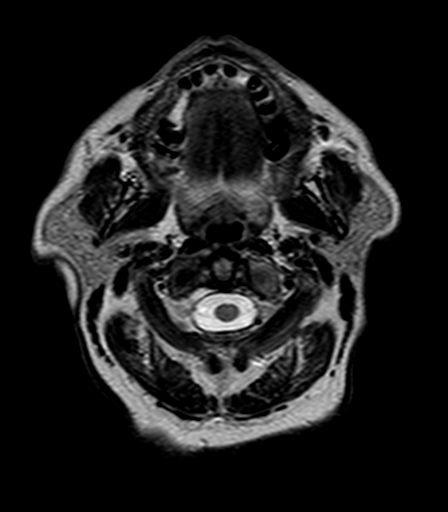
[im 14/27]
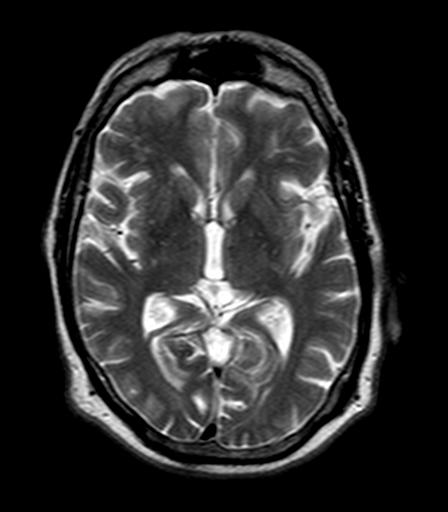
[im 27/27]
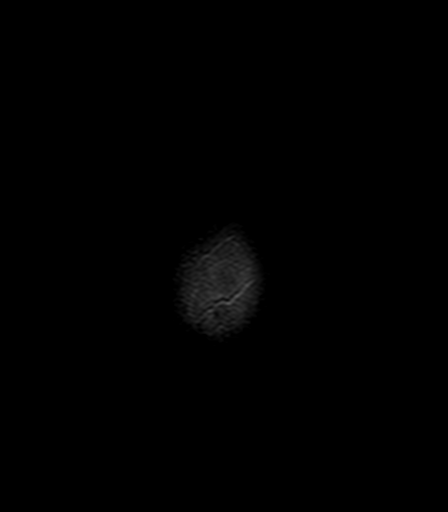

[Series 8: FLAIR · axial · 5.0mm · 0.90mm/px · z∈[-81,+87]mm · 3 of 27 slices shown]
[im 1/27]
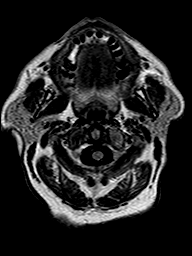
[im 14/27]
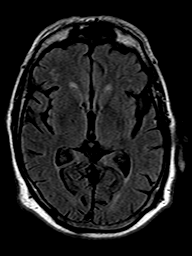
[im 27/27]
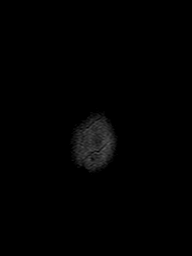

[Series 9: T2 · axial · 5.0mm · 0.45mm/px · z∈[-81,+87]mm · 3 of 27 slices shown (2 of 2)]
[im 1/27]
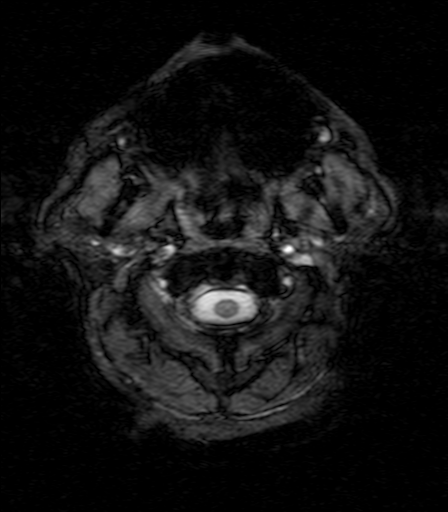
[im 14/27]
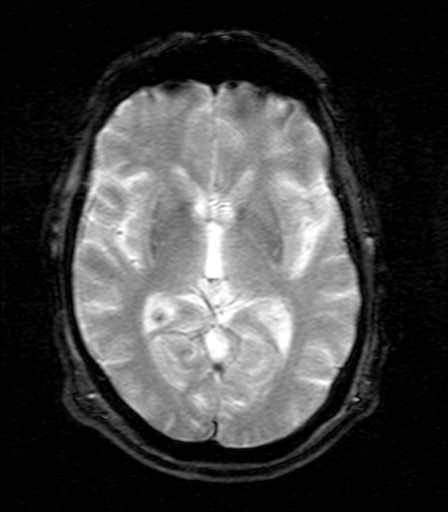
[im 27/27]
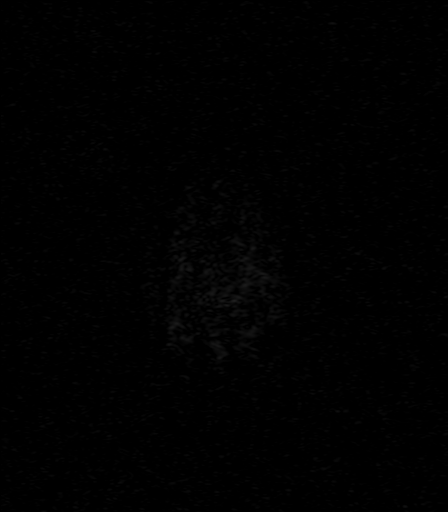

[Series 13: T2 post-contrast · coronal · 5.0mm · 0.45mm/px · 2 of 30 slices shown]
[im 1/30]
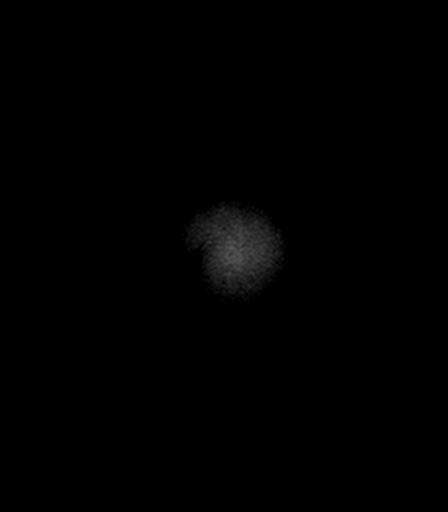
[im 15/30]
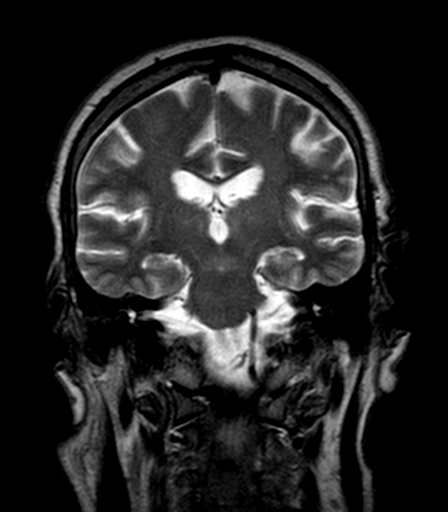

[Series 14: T1 post-contrast · axial · 3.0mm · 0.45mm/px · z∈[-79,+85]mm · 6 of 56 slices shown (1 of 3)]
[im 1/56]
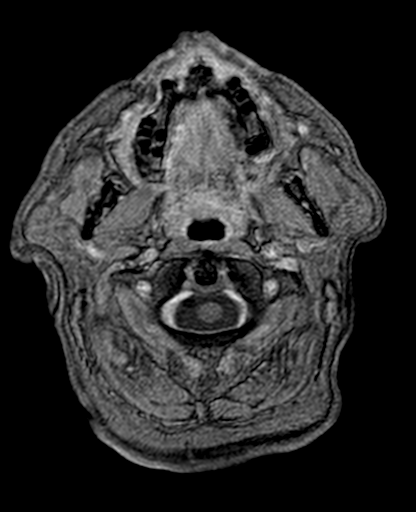
[im 12/56]
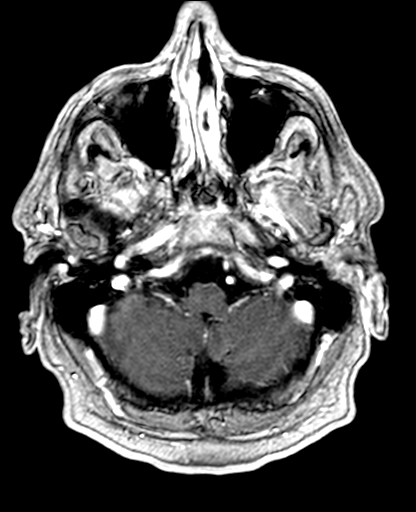
[im 23/56]
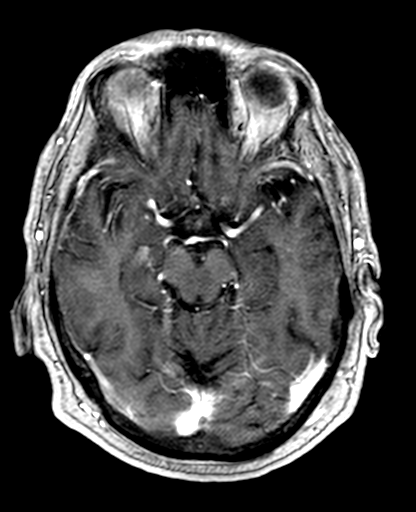
[im 34/56]
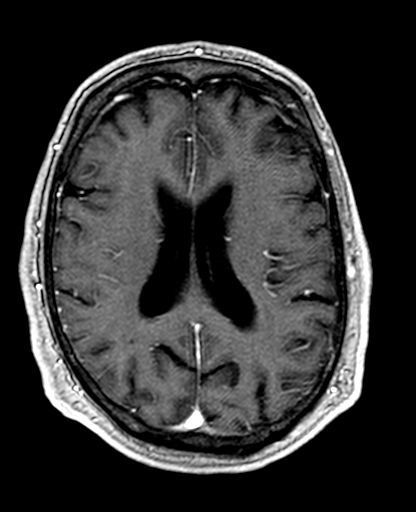
[im 45/56]
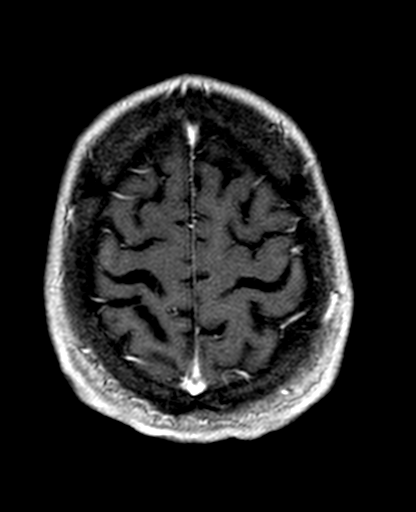
[im 56/56]
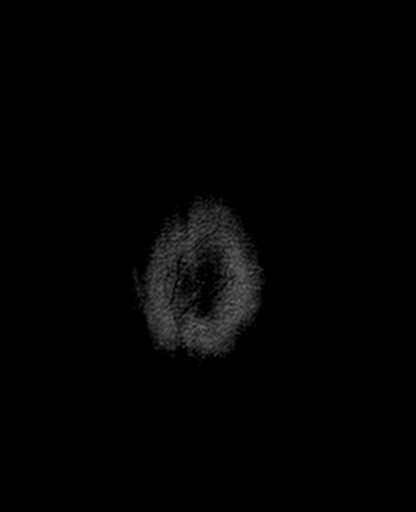

[Series 15: T1 post-contrast · coronal · 5.0mm · 0.45mm/px · 3 of 30 slices shown (2 of 3)]
[im 1/30]
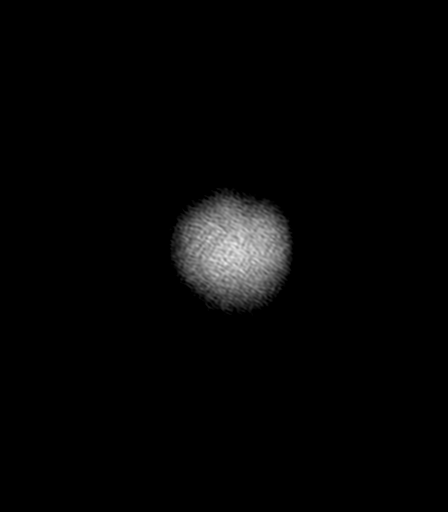
[im 15/30]
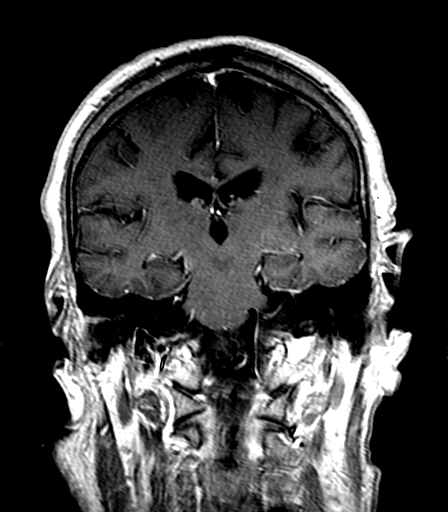
[im 30/30]
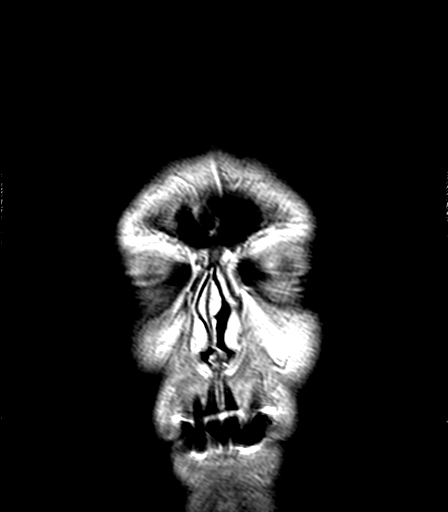

[Series 16: T1 post-contrast · sagittal · 5.0mm · 0.45mm/px · 3 of 29 slices shown (3 of 3)]
[im 1/29]
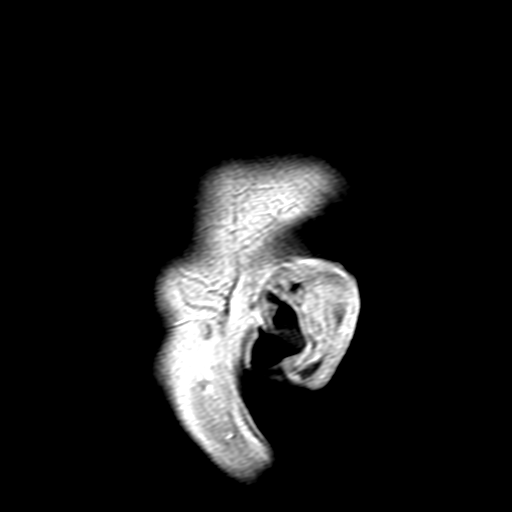
[im 15/29]
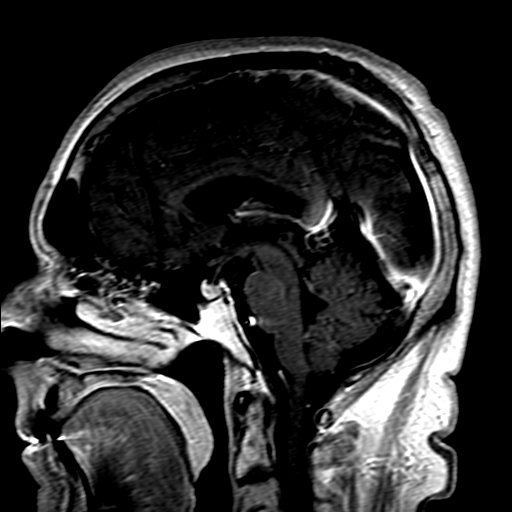
[im 29/29]
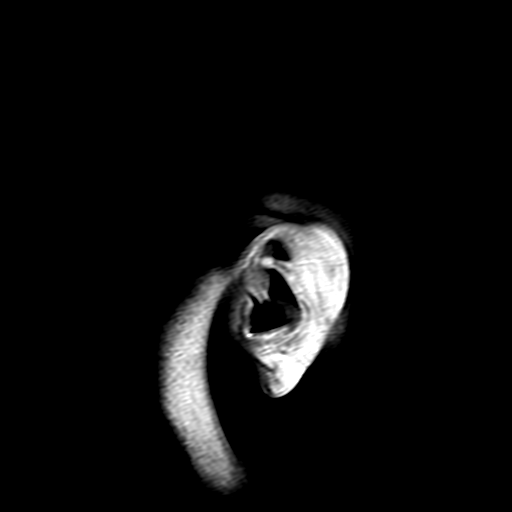

[31 of 48 positions shown; findings below may reference images not displayed]

FINDINGS: Brain: No evidence of metastatic disease. No acute infarction,
hemorrhage, hydrocephalus, extra-axial collection or mass lesion.

Vascular: Normal flow voids.

Skull and upper cervical spine: Normal marrow signal.

Sinuses/Orbits: Bilateral cataract resection.  Negative for mass.
IMPRESSION: Negative for metastatic disease.
